# Patient Record
Sex: Male | Born: 1940 | Race: White | Hispanic: No | Marital: Single | Smoking: Former smoker
Health system: Southern US, Community
[De-identification: ages and names within clinical notes are randomized; demographics above are authoritative.]

## PROBLEM LIST (undated history)

## (undated) DIAGNOSIS — F32A Depression, unspecified: Secondary | ICD-10-CM

## (undated) DIAGNOSIS — K56609 Unspecified intestinal obstruction, unspecified as to partial versus complete obstruction: Secondary | ICD-10-CM

## (undated) DIAGNOSIS — N400 Enlarged prostate without lower urinary tract symptoms: Secondary | ICD-10-CM

## (undated) DIAGNOSIS — F329 Major depressive disorder, single episode, unspecified: Secondary | ICD-10-CM

## (undated) DIAGNOSIS — Z87442 Personal history of urinary calculi: Secondary | ICD-10-CM

## (undated) DIAGNOSIS — E785 Hyperlipidemia, unspecified: Secondary | ICD-10-CM

## (undated) DIAGNOSIS — K5792 Diverticulitis of intestine, part unspecified, without perforation or abscess without bleeding: Secondary | ICD-10-CM

## (undated) DIAGNOSIS — K8020 Calculus of gallbladder without cholecystitis without obstruction: Secondary | ICD-10-CM

## (undated) DIAGNOSIS — Z5189 Encounter for other specified aftercare: Secondary | ICD-10-CM

## (undated) DIAGNOSIS — J45909 Unspecified asthma, uncomplicated: Secondary | ICD-10-CM

## (undated) DIAGNOSIS — Z8719 Personal history of other diseases of the digestive system: Secondary | ICD-10-CM

## (undated) DIAGNOSIS — I10 Essential (primary) hypertension: Secondary | ICD-10-CM

## (undated) DIAGNOSIS — E079 Disorder of thyroid, unspecified: Secondary | ICD-10-CM

## (undated) DIAGNOSIS — N200 Calculus of kidney: Secondary | ICD-10-CM

## (undated) DIAGNOSIS — M1990 Unspecified osteoarthritis, unspecified site: Secondary | ICD-10-CM

## (undated) DIAGNOSIS — M5116 Intervertebral disc disorders with radiculopathy, lumbar region: Secondary | ICD-10-CM

## (undated) HISTORY — DX: Benign prostatic hyperplasia without lower urinary tract symptoms: N40.0

## (undated) HISTORY — DX: Unspecified asthma, uncomplicated: J45.909

## (undated) HISTORY — DX: Calculus of kidney: N20.0

## (undated) HISTORY — PX: APPENDECTOMY: SHX54

## (undated) HISTORY — PX: HERNIA REPAIR: SHX51

## (undated) HISTORY — DX: Calculus of gallbladder without cholecystitis without obstruction: K80.20

## (undated) HISTORY — DX: Intervertebral disc disorders with radiculopathy, lumbar region: M51.16

## (undated) HISTORY — DX: Major depressive disorder, single episode, unspecified: F32.9

## (undated) HISTORY — DX: Depression, unspecified: F32.A

## (undated) HISTORY — DX: Unspecified osteoarthritis, unspecified site: M19.90

## (undated) HISTORY — DX: Essential (primary) hypertension: I10

## (undated) HISTORY — DX: Hyperlipidemia, unspecified: E78.5

## (undated) HISTORY — DX: Unspecified intestinal obstruction, unspecified as to partial versus complete obstruction: K56.609

## (undated) HISTORY — DX: Diverticulitis of intestine, part unspecified, without perforation or abscess without bleeding: K57.92

## (undated) HISTORY — DX: Disorder of thyroid, unspecified: E07.9

---

## 2000-10-05 ENCOUNTER — Encounter: Payer: Self-pay | Admitting: Gastroenterology

## 2000-10-05 ENCOUNTER — Ambulatory Visit (HOSPITAL_COMMUNITY): Admission: RE | Admit: 2000-10-05 | Discharge: 2000-10-05 | Payer: Self-pay | Admitting: *Deleted

## 2001-01-31 ENCOUNTER — Encounter: Payer: Self-pay | Admitting: Gastroenterology

## 2001-01-31 ENCOUNTER — Ambulatory Visit (HOSPITAL_COMMUNITY): Admission: RE | Admit: 2001-01-31 | Discharge: 2001-01-31 | Payer: Self-pay | Admitting: Gastroenterology

## 2004-09-05 DIAGNOSIS — K5792 Diverticulitis of intestine, part unspecified, without perforation or abscess without bleeding: Secondary | ICD-10-CM

## 2004-09-05 HISTORY — DX: Diverticulitis of intestine, part unspecified, without perforation or abscess without bleeding: K57.92

## 2004-09-05 HISTORY — PX: COLECTOMY: SHX59

## 2005-09-05 HISTORY — PX: ILEOSTOMY: SHX1783

## 2006-05-21 ENCOUNTER — Emergency Department: Payer: Self-pay | Admitting: Emergency Medicine

## 2007-02-24 ENCOUNTER — Emergency Department: Payer: Self-pay | Admitting: Emergency Medicine

## 2007-12-19 ENCOUNTER — Ambulatory Visit: Payer: Self-pay | Admitting: Gastroenterology

## 2008-07-16 ENCOUNTER — Ambulatory Visit: Payer: Self-pay | Admitting: Oncology

## 2008-07-28 DIAGNOSIS — I10 Essential (primary) hypertension: Secondary | ICD-10-CM | POA: Insufficient documentation

## 2008-07-28 LAB — CMP (CANCER CENTER ONLY)
ALT(SGPT): 31 U/L (ref 10–47)
AST: 37 U/L (ref 11–38)
Albumin: 3.7 g/dL (ref 3.3–5.5)
Alkaline Phosphatase: 94 U/L — ABNORMAL HIGH (ref 26–84)
BUN, Bld: 17 mg/dL (ref 7–22)
CO2: 31 mEq/L (ref 18–33)
Calcium: 9.5 mg/dL (ref 8.0–10.3)
Chloride: 104 mEq/L (ref 98–108)
Creat: 1.3 mg/dl — ABNORMAL HIGH (ref 0.6–1.2)
Glucose, Bld: 93 mg/dL (ref 73–118)
Potassium: 3.8 mEq/L (ref 3.3–4.7)
Sodium: 139 mEq/L (ref 128–145)
Total Bilirubin: 0.6 mg/dl (ref 0.20–1.60)
Total Protein: 7.3 g/dL (ref 6.4–8.1)

## 2008-07-28 LAB — MORPHOLOGY - CHCC SATELLITE
PLT EST ~~LOC~~: ADEQUATE
Platelet Morphology: NORMAL

## 2008-07-28 LAB — CBC WITH DIFFERENTIAL (CANCER CENTER ONLY)
BASO#: 0 10*3/uL (ref 0.0–0.2)
BASO%: 0.9 % (ref 0.0–2.0)
EOS%: 4.3 % (ref 0.0–7.0)
HGB: 14.4 g/dL (ref 13.0–17.1)
LYMPH#: 1.3 10*3/uL (ref 0.9–3.3)
MCHC: 33.5 g/dL (ref 32.0–35.9)
NEUT#: 2.4 10*3/uL (ref 1.5–6.5)
WBC: 4.2 10*3/uL (ref 4.0–10.0)

## 2008-07-30 LAB — HEMOGLOBINOPATHY EVALUATION
Hemoglobin Other: 0 % (ref 0.0–0.0)
Hgb A2 Quant: 2.6 % (ref 2.2–3.2)
Hgb A: 97.4 % (ref 96.8–97.8)
Hgb F Quant: 0 % (ref 0.0–2.0)
Hgb S Quant: 0 % (ref 0.0–0.0)

## 2008-07-30 LAB — FOLATE: Folate: >20 ng/mL

## 2008-07-30 LAB — FERRITIN: Ferritin: 26 ng/mL (ref 22–322)

## 2008-07-30 LAB — IRON AND TIBC
%SAT: 18 % — ABNORMAL LOW (ref 20–55)
Iron: 75 ug/dL (ref 42–165)
TIBC: 407 ug/dL (ref 215–435)
UIBC: 332 ug/dL

## 2008-07-30 LAB — RETICULOCYTES (CHCC)
ABS Retic: 76.6 10*3/uL (ref 19.0–186.0)
RBC.: 5.89 MIL/uL — ABNORMAL HIGH (ref 4.22–5.81)
Retic Ct Pct: 1.3 % (ref 0.4–3.1)

## 2009-01-05 ENCOUNTER — Ambulatory Visit: Payer: Self-pay | Admitting: Oncology

## 2009-01-07 LAB — CBC WITH DIFFERENTIAL (CANCER CENTER ONLY)
BASO%: 3 % — ABNORMAL HIGH (ref 0.0–2.0)
EOS%: 3.1 % (ref 0.0–7.0)
HCT: 48 % (ref 38.7–49.9)
LYMPH#: 1.3 10*3/uL (ref 0.9–3.3)
MCHC: 34.8 g/dL (ref 32.0–35.9)
NEUT#: 3.2 10*3/uL (ref 1.5–6.5)
NEUT%: 60.4 % (ref 40.0–80.0)
Platelets: 182 10*3/uL (ref 145–400)
RDW: 13.1 % (ref 10.5–14.6)

## 2009-01-07 LAB — FERRITIN: Ferritin: 67 ng/mL (ref 22–322)

## 2009-01-07 LAB — IRON AND TIBC
%SAT: 44 % (ref 20–55)
Iron: 161 ug/dL (ref 42–165)
TIBC: 370 ug/dL (ref 215–435)
UIBC: 209 ug/dL

## 2009-09-05 HISTORY — PX: KNEE SURGERY: SHX244

## 2009-11-05 ENCOUNTER — Ambulatory Visit: Payer: Self-pay | Admitting: Rheumatology

## 2009-12-11 ENCOUNTER — Ambulatory Visit: Payer: Self-pay | Admitting: Orthopedic Surgery

## 2010-01-01 ENCOUNTER — Ambulatory Visit: Payer: Self-pay | Admitting: Orthopedic Surgery

## 2010-06-03 ENCOUNTER — Inpatient Hospital Stay: Payer: Self-pay | Admitting: Surgery

## 2011-01-21 NOTE — Procedures (Signed)
[facility]  Patient:    Stephen Cervantes, Stephen Cervantes                       MRN: [id number] Proc. Date: 10/05/00 Adm. Date:  [id number] Disc. Date: [id number] Attending:  Louie Bun CC:         Jethro Bastos, M.D.   Procedure Report  INDICATIONS FOR PROCEDURE:  Worsening fecal obstipation and straining at stool.  PROCEDURE:  The patient was placed in the left lateral decubitus position and placed on the pulse monitor with continuous low-flow oxygen, delivered by nasal cannula.  He was sedated with 70 mg of IV Demerol and 7 mg of IV Versed. The Olympus video colonoscope was inserted into the rectum and advanced to approximately 30 cm where some diverticula were noted, and an increasingly more pronounced appearance of granularity, edema, and firmness was encountered.  I could not traverse the rectosigmoid junction at about 25 cm. The scope was withdrawn.  The pediatric colonoscope was inserted and encountered the same difficulty.  I could not completely rule out the leading edge, this obstructive area being the leading edge of a neoplasm but probably appeared more consistent with diverticulitis, based on the overall appearance. The scope was then withdrawn, and the patient returned to the recovery room in a stable condition.  He tolerated the procedure well, and there were no immediate complications.  IMPRESSION: 1. Incomplete colonoscopy. 2. Probable partial obstruction at the rectosigmoid junction.  Suspect diverticulitis.  Rule out neoplasm with bleeder without confined perforation.  PLAN:  Will obtain CT scan and CBC.  Will keep him on a full liquid diet for now.  Will begin Cipro empirically. DD:  10/05/00 TD:  10/05/00 Job: [id number]

## 2011-06-01 ENCOUNTER — Inpatient Hospital Stay: Payer: Self-pay | Admitting: Surgery

## 2011-06-03 LAB — PATHOLOGY REPORT

## 2011-08-17 ENCOUNTER — Ambulatory Visit: Payer: Self-pay

## 2012-09-01 ENCOUNTER — Emergency Department: Payer: Self-pay | Admitting: Emergency Medicine

## 2012-09-01 LAB — COMPREHENSIVE METABOLIC PANEL
Albumin: 4.1 g/dL (ref 3.4–5.0)
Anion Gap: 7 (ref 7–16)
Calcium, Total: 10.1 mg/dL (ref 8.5–10.1)
Chloride: 104 mmol/L (ref 98–107)
EGFR (African American): 48 — ABNORMAL LOW
Glucose: 127 mg/dL — ABNORMAL HIGH (ref 65–99)
Potassium: 3.8 mmol/L (ref 3.5–5.1)
SGOT(AST): 67 U/L — ABNORMAL HIGH (ref 15–37)
SGPT (ALT): 96 U/L — ABNORMAL HIGH (ref 12–78)

## 2012-09-01 LAB — URINALYSIS, COMPLETE
Bilirubin,UR: NEGATIVE
Ketone: NEGATIVE
Leukocyte Esterase: NEGATIVE
Ph: 5 (ref 4.5–8.0)
RBC,UR: 156 /HPF (ref 0–5)
Squamous Epithelial: NONE SEEN

## 2012-09-01 LAB — CBC
HCT: 50.2 % (ref 40.0–52.0)
MCH: 30.7 pg (ref 26.0–34.0)
MCV: 91 fL (ref 80–100)
Platelet: 188 10*3/uL (ref 150–440)
RBC: 5.54 10*6/uL (ref 4.40–5.90)

## 2012-09-01 LAB — LIPASE, BLOOD: Lipase: 166 U/L (ref 73–393)

## 2012-09-05 HISTORY — PX: JOINT REPLACEMENT: SHX530

## 2012-09-13 ENCOUNTER — Ambulatory Visit: Payer: Self-pay | Admitting: Urology

## 2012-09-13 DIAGNOSIS — R310 Gross hematuria: Secondary | ICD-10-CM | POA: Insufficient documentation

## 2012-09-13 DIAGNOSIS — D419 Neoplasm of uncertain behavior of unspecified urinary organ: Secondary | ICD-10-CM | POA: Insufficient documentation

## 2012-09-13 DIAGNOSIS — N23 Unspecified renal colic: Secondary | ICD-10-CM | POA: Insufficient documentation

## 2012-09-13 DIAGNOSIS — N201 Calculus of ureter: Secondary | ICD-10-CM | POA: Insufficient documentation

## 2012-09-13 DIAGNOSIS — N200 Calculus of kidney: Secondary | ICD-10-CM | POA: Insufficient documentation

## 2013-01-09 ENCOUNTER — Ambulatory Visit: Payer: Self-pay | Admitting: Urology

## 2013-01-09 DIAGNOSIS — N401 Enlarged prostate with lower urinary tract symptoms: Secondary | ICD-10-CM | POA: Insufficient documentation

## 2013-01-09 DIAGNOSIS — N400 Enlarged prostate without lower urinary tract symptoms: Secondary | ICD-10-CM | POA: Insufficient documentation

## 2013-01-09 DIAGNOSIS — N138 Other obstructive and reflux uropathy: Secondary | ICD-10-CM | POA: Insufficient documentation

## 2013-02-11 ENCOUNTER — Ambulatory Visit: Payer: Self-pay | Admitting: Orthopedic Surgery

## 2013-02-11 LAB — URINALYSIS, COMPLETE
Bilirubin,UR: NEGATIVE
Glucose,UR: NEGATIVE mg/dL (ref 0–75)
Ketone: NEGATIVE
Leukocyte Esterase: NEGATIVE
Nitrite: NEGATIVE
Ph: 5 (ref 4.5–8.0)
RBC,UR: 1 /HPF (ref 0–5)

## 2013-02-11 LAB — BASIC METABOLIC PANEL
Anion Gap: 6 — ABNORMAL LOW (ref 7–16)
Calcium, Total: 9.2 mg/dL (ref 8.5–10.1)
Creatinine: 1.05 mg/dL (ref 0.60–1.30)
EGFR (African American): >60
EGFR (Non-African Amer.): >60
Osmolality: 283 (ref 275–301)
Potassium: 3.4 mmol/L — ABNORMAL LOW (ref 3.5–5.1)
Sodium: 141 mmol/L (ref 136–145)

## 2013-02-11 LAB — CBC
HGB: 16.2 g/dL (ref 13.0–18.0)
MCV: 88 fL (ref 80–100)
RBC: 5.22 10*6/uL (ref 4.40–5.90)
RDW: 13.2 % (ref 11.5–14.5)
WBC: 3.5 10*3/uL — ABNORMAL LOW (ref 3.8–10.6)

## 2013-02-11 LAB — MRSA PCR SCREENING

## 2013-02-11 LAB — APTT: Activated PTT: 33.2 secs (ref 23.6–35.9)

## 2013-02-11 LAB — PROTIME-INR
INR: 0.9
Prothrombin Time: 12.8 secs (ref 11.5–14.7)

## 2013-02-28 ENCOUNTER — Inpatient Hospital Stay: Payer: Self-pay | Admitting: Orthopedic Surgery

## 2013-03-01 LAB — PLATELET COUNT: Platelet: 154 10*3/uL (ref 150–440)

## 2013-03-01 LAB — BASIC METABOLIC PANEL
Calcium, Total: 7.8 mg/dL — ABNORMAL LOW (ref 8.5–10.1)
Co2: 28 mmol/L (ref 21–32)
EGFR (African American): >60
EGFR (Non-African Amer.): 59 — ABNORMAL LOW
Osmolality: 284 (ref 275–301)
Potassium: 3.9 mmol/L (ref 3.5–5.1)

## 2013-03-01 LAB — HEMOGLOBIN: HGB: 12.4 g/dL — ABNORMAL LOW (ref 13.0–18.0)

## 2014-04-06 ENCOUNTER — Inpatient Hospital Stay: Payer: Self-pay | Admitting: Radiology

## 2014-04-06 LAB — COMPREHENSIVE METABOLIC PANEL
ALBUMIN: 3.7 g/dL (ref 3.4–5.0)
ALK PHOS: 92 U/L
ANION GAP: 7 (ref 7–16)
AST: 48 U/L — AB (ref 15–37)
BUN: 14 mg/dL (ref 7–18)
Bilirubin,Total: 0.8 mg/dL (ref 0.2–1.0)
CALCIUM: 9.7 mg/dL (ref 8.5–10.1)
Chloride: 108 mmol/L — ABNORMAL HIGH (ref 98–107)
Co2: 25 mmol/L (ref 21–32)
Creatinine: 1.16 mg/dL (ref 0.60–1.30)
Glucose: 101 mg/dL — ABNORMAL HIGH (ref 65–99)
OSMOLALITY: 280 (ref 275–301)
Potassium: 4.1 mmol/L (ref 3.5–5.1)
SGPT (ALT): 43 U/L
Sodium: 140 mmol/L (ref 136–145)
Total Protein: 7.4 g/dL (ref 6.4–8.2)

## 2014-04-06 LAB — CBC WITH DIFFERENTIAL/PLATELET
BASOS ABS: 0.1 10*3/uL (ref 0.0–0.1)
Basophil %: 0.7 %
Eosinophil #: 0.1 10*3/uL (ref 0.0–0.7)
Eosinophil %: 1.5 %
HCT: 49.7 % (ref 40.0–52.0)
HGB: 16.8 g/dL (ref 13.0–18.0)
Lymphocyte #: 0.8 10*3/uL — ABNORMAL LOW (ref 1.0–3.6)
Lymphocyte %: 11.6 %
MCH: 30.6 pg (ref 26.0–34.0)
MCHC: 33.9 g/dL (ref 32.0–36.0)
MCV: 90 fL (ref 80–100)
MONO ABS: 0.4 x10 3/mm (ref 0.2–1.0)
Monocyte %: 6 %
Neutrophil #: 5.7 10*3/uL (ref 1.4–6.5)
Neutrophil %: 80.2 %
PLATELETS: 182 10*3/uL (ref 150–440)
RBC: 5.5 10*6/uL (ref 4.40–5.90)
RDW: 13.5 % (ref 11.5–14.5)
WBC: 7.1 10*3/uL (ref 3.8–10.6)

## 2014-04-06 LAB — LIPASE, BLOOD: Lipase: 137 U/L (ref 73–393)

## 2014-04-07 LAB — CBC WITH DIFFERENTIAL/PLATELET
BASOS PCT: 0.7 %
Basophil #: 0 10*3/uL (ref 0.0–0.1)
EOS PCT: 3.2 %
Eosinophil #: 0.2 10*3/uL (ref 0.0–0.7)
HCT: 43.5 % (ref 40.0–52.0)
HGB: 14.6 g/dL (ref 13.0–18.0)
LYMPHS PCT: 27.5 %
Lymphocyte #: 1.4 10*3/uL (ref 1.0–3.6)
MCH: 30.9 pg (ref 26.0–34.0)
MCHC: 33.5 g/dL (ref 32.0–36.0)
MCV: 92 fL (ref 80–100)
MONOS PCT: 9.8 %
Monocyte #: 0.5 x10 3/mm (ref 0.2–1.0)
NEUTROS PCT: 58.8 %
Neutrophil #: 3 10*3/uL (ref 1.4–6.5)
PLATELETS: 142 10*3/uL — AB (ref 150–440)
RBC: 4.71 10*6/uL (ref 4.40–5.90)
RDW: 13.2 % (ref 11.5–14.5)
WBC: 5.1 10*3/uL (ref 3.8–10.6)

## 2014-04-07 LAB — COMPREHENSIVE METABOLIC PANEL
ALBUMIN: 2.9 g/dL — AB (ref 3.4–5.0)
ALT: 36 U/L
Alkaline Phosphatase: 81 U/L
Anion Gap: 2 — ABNORMAL LOW (ref 7–16)
BUN: 10 mg/dL (ref 7–18)
Bilirubin,Total: 0.8 mg/dL (ref 0.2–1.0)
CALCIUM: 8.2 mg/dL — AB (ref 8.5–10.1)
CO2: 27 mmol/L (ref 21–32)
Chloride: 111 mmol/L — ABNORMAL HIGH (ref 98–107)
Creatinine: 1.15 mg/dL (ref 0.60–1.30)
EGFR (African American): >60
GLUCOSE: 111 mg/dL — AB (ref 65–99)
Osmolality: 279 (ref 275–301)
Potassium: 4.2 mmol/L (ref 3.5–5.1)
SGOT(AST): 28 U/L (ref 15–37)
SODIUM: 140 mmol/L (ref 136–145)
Total Protein: 5.9 g/dL — ABNORMAL LOW (ref 6.4–8.2)

## 2014-04-07 LAB — PROTIME-INR
INR: 1.1
Prothrombin Time: 13.9 secs (ref 11.5–14.7)

## 2014-04-07 LAB — AMYLASE: Amylase: 51 U/L (ref 25–115)

## 2014-04-07 LAB — APTT: ACTIVATED PTT: 35.3 s (ref 23.6–35.9)

## 2014-04-07 LAB — MAGNESIUM: Magnesium: 1.8 mg/dL

## 2014-07-02 ENCOUNTER — Ambulatory Visit (INDEPENDENT_AMBULATORY_CARE_PROVIDER_SITE_OTHER): Payer: Medicare Other | Admitting: General Surgery

## 2014-07-02 ENCOUNTER — Encounter: Payer: Self-pay | Admitting: General Surgery

## 2014-07-02 VITALS — BP 130/100 | HR 88 | Resp 14 | Ht 70.0 in | Wt 186.0 lb

## 2014-07-02 DIAGNOSIS — K8020 Calculus of gallbladder without cholecystitis without obstruction: Secondary | ICD-10-CM

## 2014-07-02 DIAGNOSIS — R109 Unspecified abdominal pain: Secondary | ICD-10-CM

## 2014-07-02 NOTE — Progress Notes (Addendum)
Patient ID: Stephen Cervantes, male   DOB: [birth year], 73 y.o.   MRN: [id number]  Chief Complaint  Patient presents with  . Other    Evaluation for gallstones    HPI Stephen Cervantes is a 73 y.o. male who presents for an evaluation of gallstones. The patient had an abdominal CT scan done on 06/12/14 at [facility] for abdominal pain. The patient complains of  abdominal pain for approximately 2 months. He states he has left lower quadrant pain that radiates to his back. He has had 1 episode that was really intense approximately 1.5 months ago. No known contributing facts that he is aware of. The pain comes and goes. The pain is described as stabbing pain.  The patient reports that he continues to have a-10 bowel movements per day since takedown of his ileostomy 6+ years ago.  HPI  Past Medical History  Diagnosis Date  . Arthritis   . Thyroid disease   . Kidney stone   . Enlarged prostate   . Bowel obstruction 2011,2015  . Diverticulitis 2006    Past Surgical History  Procedure Laterality Date  . Colectomy  2006  . Ileostomy  2007  . Hernia repair  2008?    [place]  . Knee surgery Right 2011  . Joint replacement  2014    Hip Replacement Hessie Knows    Family History  Problem Relation Age of Onset  . Cancer Father     Social History History  Substance Use Topics  . Smoking status: Former Smoker -- 19 years  . Smokeless tobacco: Not on file  . Alcohol Use: No    Allergies  Allergen Reactions  . Morphine And Related Other (See Comments)    Severe hallucinations  . Tape Other (See Comments)    Silk Tape per patient "peels off my skin"    Current Outpatient Prescriptions  Medication Sig Dispense Refill  . calcium-vitamin D 250-100 MG-UNIT per tablet Take 1 tablet by mouth 2 (two) times daily.    . celecoxib (CELEBREX) 200 MG capsule 200 mg daily.     . cephALEXin (KEFLEX) 500 MG capsule Take 500 mg by mouth as needed.    .  hydrochlorothiazide (HYDRODIURIL) 25 MG tablet Take 25 mg by mouth daily.     Marland Kitchen levothyroxine (SYNTHROID, LEVOTHROID) 100 MCG tablet Take 100 mcg by mouth daily before breakfast.     . loperamide (IMODIUM) 2 MG capsule Take by mouth as needed for diarrhea or loose stools.    Marland Kitchen loratadine (CLARITIN) 10 MG tablet Take 10 mg by mouth daily.    . Lutein 20 MG CAPS Take 1 capsule by mouth daily.    . montelukast (SINGULAIR) 10 MG tablet Take 10 mg by mouth at bedtime.     . Multiple Vitamin (MULTI VITAMIN DAILY PO) Take 1 tablet by mouth daily.    . Omega-3 Fatty Acids (FISH OIL) 1000 MG CAPS Take 1 capsule by mouth daily.    . potassium citrate (UROCIT-K) 10 MEQ (1080 MG) SR tablet Take 10 mEq by mouth 3 (three) times daily with meals.      No current facility-administered medications for this visit.    Review of Systems Review of Systems  Constitutional: Negative.   Respiratory: Negative.   Cardiovascular: Negative.   Gastrointestinal: Positive for abdominal pain.    Blood pressure 130/100, pulse 88, resp. rate 14, height 5\' 10"  (1.778 m), weight 186 lb (84.369 kg).  Physical Exam  Physical Exam  Constitutional: He is oriented to person, place, and time. He appears well-developed and well-nourished.  Cardiovascular: Normal rate, regular rhythm and normal heart sounds.   No murmur heard. Pulmonary/Chest: Effort normal and breath sounds normal.  Abdominal: Soft. Normal appearance and bowel sounds are normal. There is no hepatosplenomegaly. There is no tenderness. No hernia.    Neurological: He is alert and oriented to person, place, and time.  Skin: Skin is warm and dry.    Data Reviewed CT scan of 06/12/2014 completed at the primary care office report was available for review. The images were not available for review. Surgical changes on the abdominal wall secondary mesh placement are noted. A 1 mm calculus in the upper pole of the right kidney. One millimeter calculus in the upper  and lower pole of the left kidney. The gallbladder was noted to be contracted and containing small stones. No other intra-abdominal pathology reported.  September 2012 operative report from [facility].  PCP notes of [facility] since September and October, 2015 were reviewed. reviewed.  Assessment    Asymptomatic cholelithiasis.  Multiple previous abdominal procedures including laparotomy for small bowel obstruction September 2012 completed by Molly Maduro, M.D.    Plan    The patient has asymptomatic cholelithiasis. There is no dietary history that could be elicited. He has no history of right-sided abdominal pain. No laboratory studies are available for review to suggest occult disease.  At this time there is no indication for surgical intervention. The source of the patient's left-sided pain is not evident on today's exam or based on the CT report noted above.  The patient was amenable to review the records completed at [facility] regarding his initial colectomy. A release of information has been obtained.    PCP/Ref. MD: Ruben Im 07/11/2014, 8:41 AM

## 2014-07-02 NOTE — Patient Instructions (Signed)
Patient to return as needed. The patient is aware to call back for any questions or concerns. 

## 2014-07-04 DIAGNOSIS — R10A2 Flank pain, left side: Secondary | ICD-10-CM | POA: Insufficient documentation

## 2014-07-04 DIAGNOSIS — R109 Unspecified abdominal pain: Secondary | ICD-10-CM | POA: Insufficient documentation

## 2014-07-04 DIAGNOSIS — K8020 Calculus of gallbladder without cholecystitis without obstruction: Secondary | ICD-10-CM

## 2014-07-04 HISTORY — DX: Calculus of gallbladder without cholecystitis without obstruction: K80.20

## 2014-10-04 ENCOUNTER — Encounter: Payer: Self-pay | Admitting: General Surgery

## 2014-10-04 NOTE — Progress Notes (Signed)
Records from [place] for the patient's hospitalization covering 03/30/2005 through 05/05/2005 were reviewed.  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 He underwent endoluminal luminal stent placement without relief of the obstruction. He was subtotally noted 3 days later to have marked abdominal distention and free air. 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 Intraoperative blood loss was impressive at 1100 mL due to a tear in the veins just below the inferior border of the pancreas. Pathology showed diverticular disease without evidence of malignancy.  CT scan of the abdomen and pelvis completed 06/12/2014 at [place] showed changes of the abdominal wall status post hernia repair and gallstones without evidence of cholecystitis, but otherwise was unremarkable.  At the time of the patient's October 2015 exam he had no clinical symptoms to suggest a biliary source of his left upper quadrant pain. Observation alone is planned that present in regards to his gallstones.

## 2014-10-06 ENCOUNTER — Telehealth: Payer: Self-pay

## 2014-10-06 NOTE — Telephone Encounter (Signed)
-----   Message from Robert Bellow, MD sent at 10/04/2014  6:30 PM EST ----- Please notify the patient that I reviewed his 2008 emergency colon surgery records from [facility]. He is a very lucky man. He was seen here in October in regards to left upper quadrant discomfort and incidentally noted gallstones. Inquire as to whether his left-sided pain has resolved or if he's developed any dietary intolerance (none evident on his interview in October). Thank you

## 2014-10-07 NOTE — Telephone Encounter (Signed)
Spoke with patient and he reports that he is doing very well and has had no further problems and no dietary intolerances. He will call with any problems in the future.

## 2014-12-26 NOTE — Op Note (Signed)
PATIENT NAME:  JEWELL, Stephen Cervantes DATE OF BIRTH:  03-Sep-1941  DATE OF PROCEDURE:  02/28/2013  PREOPERATIVE DIAGNOSIS:  Severe right hip osteoarthritis.   POSTOPERATIVE DIAGNOSIS:  Severe right hip osteoarthritis.   PROCEDURE:  Right total hip replacement.   SURGEON:  Laurene Footman, MD   ASSISTANT:  April M. Tretha Sciara, Nurse Practitioner.   ANESTHESIA:  Spinal.   DESCRIPTION OF PROCEDURE:  The patient was brought to the operating room and after anesthesia was obtained, the patient was transferred to the operative table in the supine position with left leg in the well leg holder and right leg in the Medacta attachment. The C-arm was brought in and good visualization of the hip could be obtained. Next, the hip was prepped and draped in the usual sterile fashion. After patient identification and timeout procedures were carried out, the hip was approached through an anterior incision centered over the greater trochanter approximately 9 cm in length. After going down through the skin and subcutaneous tissue, the tensor fascia was entered and retracted laterally. Deep fascia incised and the circumflex vessels clamped and ligated. The anterior capsule was exposed and the fat removed. Anterior capsulotomy was carried out and a modified Charnley placed after femoral neck resection. The femoral head showed severe degenerative change with marked arthritis superiorly. The labrum was excised and the acetabulum reamed to 54 mm. The 54 mm trial fit well and the 54 mm cup was a nice solid fit and appropriate position on x-ray. The leg was externally rotated and capsular release carried out to allow for external rotation and extension along with adduction. Sequential broaching was carried out to the #3 and with trials, this was a nice fit. Then the #3 stem appeared appropriate. The #3 stem was then impacted and a 28 mm head and 54 mm DM liner. The hip was reduced and was stable to external rotation. Leg  lengths appeared to be compatible with preop x-ray. The wound was thoroughly irrigated. The capsule was repaired using Ethibond suture. The deep fascia repaired using a heavy quill, subcutaneous drain, 2-0 quill followed by Xeroform, 4 x 4's, ABD and tape. The patient was sent to the recovery room in stable condition.   ESTIMATED BLOOD LOSS:  700 mL.   COMPLICATIONS:  None.   SPECIMENS:  Femoral head.    ____________________________ Laurene Footman, MD mjm:si D: 02/28/2013 18:41:24 ET T: 02/28/2013 23:03:48 ET JOB#: [id number]  cc: Laurene Footman, MD, <Dictator> Laurene Footman MD ELECTRONICALLY SIGNED 03/01/2013 4:55

## 2014-12-26 NOTE — Discharge Summary (Signed)
PATIENT NAME:  Stephen Cervantes, Stephen Cervantes MR#:  [id number] DATE OF BIRTH:  12/02/1940  DATE OF ADMISSION:  02/28/2013 DATE OF DISCHARGE:  03/02/2013  ADMITTING DIAGNOSIS:  Severe right hip osteoarthritis.   DISCHARGE DIAGNOSIS:  Severe right hip osteoarthritis.   PROCEDURE:  Right total hip replacement.   SURGEON:  Laurene Footman, M.D.   ASSISTANT:  April M. Tretha Sciara, Nurse Practitioner   ANESTHESIA:  Spinal.   ESTIMATED BLOOD LOSS:  700 mL.   COMPLICATIONS:  None.   SPECIMEN:  Femoral head.   HISTORY OF PRESENT ILLNESS:  The patient is a 74 year old who has had moderate severe right hip pain for the last 3 to 4 months. X-rays showed significant degenerative changes of the right hip joint. The patient has had no relief and anti-inflammatory medications. His pain interferes with his ADLs and he is unable to walk for long distances. He is unable to walk to the mailbox to get mail due to significant right hip pain. He has pain with rest as well as with activity. He denies any back pain, numbness or tingling, or weakness in bilateral lower extremities.   PHYSICAL EXAMINATION:  GENERAL:  Well developed, well-nourished male in no apparent distress. Normal affect. mild antalgic component on right lower extremity.  HEENT:  Head normocephalic, atraumatic. Pupils equal, and reactive light.  LUNGS:  Clear to auscultation bilaterally. No wheezing, rhonchi, or rales.  HEART:  Regular rate and rhythm.  RIGHT HIP:  Examination of the right hip shows the patient has no tenderness over the greater trochanter. He has 0 degrees of internal rotation of the right hip with mild groin discomfort. He also has pain with hip flexion, external rotation of the hip joint. He is neurovascularly intact in the right lower extremity.   HOSPITAL COURSE:  The patient was admitted to the hospital on 02/28/2013. He had surgery the same day and was brought to the orthopaedic floor from the PACU in stable condition. On postoperative  day 1, the patient had a very mild acute postoperative blood loss anemia with a hemoglobin of 12.4. The patient's vital signs remained stable and he had excellent progress with physical therapy. On postoperative day 2, the patient was doing very well. He had progressed well with therapy, vital signs were stable, and he was ready for discharge to home with home physical therapy.   CONDITION AT DISCHARGE:  Stable.   DISCHARGE INSTRUCTIONS:  The patient may gradually increase weightbearing on the affected extremity. He is to wear thigh-high TED hose on both legs and remove at bedtime and replace when arising the next morning. Elevate heels off the bed. Use incentive spirometry every one hour while awake and encourage cough and deep breathing. He may resume a regular diet as tolerated. Apply an ice pack to the affected area. Do not get the dressing or bandage wet or dirty. Call [place] if any of the following occur such as bright red bleeding from the incision wound, fever above 101.5 degrees, redness, swelling, or drainage at the incision. Call [place] if you experience any increased leg pain, numbness or weakness in legs or bowel or bladder symptoms. He is referred to Home Physical Therapy. He should contact us if he has not been contacted within 48 hours of his return home and he is to call  [place] for appointment in two weeks for staple removal.   DISCHARGE MEDICATIONS:  Loperamide 2 mg oral tablet 1 tablet orally 4 times a day,  potassium citrate 10 mEq oral tablet extended release 1 tablet orally 4 times a day, hydrochlorothiazide 25 mg oral tablet 1 tablet orally once a day in the morning, levothyroxine 150 mcg oral tablet 1 tablet orally once a day in the morning, Loratadine 10 mg oral tablet 1 tablet orally once a day in the morning, montelucast 10 mg oral tablet 1 tablet orally once a day in the morning, multivitamin 1 tablet orally once a  day in the morning, Vicodin 7.5/325, 1 tablet orally 4 to 6 hours as needed for pain, Tylenol 500 mg oral tablet 1 tablet orally every 4 hours as needed for pain or temperature greater than 100.4, magnesium hydroxide 8% oral suspension 30     mL orally 2 times a day as needed for constipation, Xarelto 10 mg oral tablet 1 tablet orally once a day in the morning x 9 days.    ____________________________ T. Rachelle Hora, PA-C tcg:jm D: 03/02/2013 08:09:54 ET T: 03/02/2013 17:57:00 ET JOB#: [id number]  cc: T. Rachelle Hora, PA-C, <Dictator> Duanne Guess [place] ELECTRONICALLY SIGNED 03/13/2013 7:43

## 2014-12-27 NOTE — H&P (Signed)
History of Present Illness 63 yom, with an extensive surgical Hx, who was awoken at 2:00 AM with severe abdominal pain; he could not return to sleep. He ate a normal dinner and had his last BM last PM, but he typically has at least 10 BMs per day. He says his abdomen is bloated, and currently he rates his abdominal pain as 3/10. It is not constant; it comes and goes. He is anorexic and nauseous, but he has not vomited. He had a total abdominal colectomy and ileostomy in 2006 for ruptured diverticulitis, ileostomy takedown and (I think, an)ileosigmoid colostomy in 2007 (both by Lucillie Garfinkel), a Stoppa mesh [facility] in 2009, and an exploratory laparotomy, extensive enterolysis, small bowel repair x 2, multiple mesh excisions, and omentoplasty by me in 2012 (I put the omentum between his remaining mesh and his intestine).   Past Med/Surgical Hx:  Hypothyroidism:   Sleep Apnea-Uses C-Pap:   Hypertension:   ruptured colon:   Hernia Repair With Mesh:   Ileostomy Take Down:   Colectomy:   ALLERGIES:  Morphine: Alt Ment Status, Hallucinations  Oxycodone: Other, Itching  silk tape: Blisters  HOME MEDICATIONS: Medication Instructions Status  acetaminophen 500 mg oral tablet 2 tabs (1025m) orally every 6 hours as needed pain. Active  rivaroxaban 10 mg oral tablet 1 tab(s) orally once a day (in the morning) x 30 days Active  magnesium hydroxide 8% oral suspension 30 milliliter(s) orally 2 times a day, As needed, constipation Active  acetaminophen-HYDROcodone 325 mg-7.5 mg oral tablet 1 tab(s) orally  Active  loperamide 2 mg oral tablet 1 tab(s) orally 3-4 times a day Active  potassium citrate 10 mEq oral tablet, extended release 1 tab(s) orally 4 times a day Active  hydrochlorothiazide 25 mg oral tablet 1 tab(s) orally once a day (in the morning) Active  levothyroxine 150 mcg (0.15 mg) oral tablet 1 tab(s) orally once a day (in the morning) Active  loratadine 10 mg oral tablet 1 tab(s) orally once a day  (in the morning) Active  montelukast 10 mg oral tablet 1 tab(s) orally once a day (in the morning) Active  multivitamin 1 tab(s) orally once a day (in the morning) Active   Family and Social History:  Family History Non-Contributory   Social History negative tobacco, negative ETOH, negative Illicit drugs, divorced, retired, self-employed   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   Review of Systems:  Fever/Chills No   Cough No   Sputum No   Abdominal Pain Yes   Diarrhea No   Constipation Yes   Nausea/Vomiting Yes   SOB/DOE No   Chest Pain No   Dysuria No   Tolerating PT Yes   Tolerating Diet No  Nauseated   Medications/Allergies Reviewed Medications/Allergies reviewed   Physical Exam:  GEN well developed, well nourished, no acute distress   HEENT pink conjunctivae, PERRL, hearing intact to voice, moist oral mucosa, Oropharynx clear   NECK supple  trachea midline   RESP normal resp effort  clear BS  no use of accessory muscles   CARD regular rate  no murmur  no JVD  no Rub   ABD denies tenderness  mild distention, no tympany, no tenderness   LYMPH negative neck   EXTR negative cyanosis/clubbing, negative edema   SKIN normal to palpation, skin turgor good   NEURO cranial nerves intact, negative tremor, follows commands, motor/sensory function intact   PSYCH alert, A+O to time, place, person, good insight   Lab Results: Hepatic:  02-Aug-15 11:50  Bilirubin, Total 0.8  Alkaline Phosphatase 92 (46-116 NOTE: New Reference Range 03/25/14)  SGPT (ALT) 43 (14-63 NOTE: New Reference Range 03/25/14)  SGOT (AST)  48  Total Protein, Serum 7.4  Albumin, Serum 3.7  Routine Chem:  02-Aug-15 11:50   Lipase 137 (Result(s) reported on 06 Apr 2014 at 12:30PM.)  Glucose, Serum  101  BUN 14  Creatinine (comp) 1.16  Sodium, Serum 140  Potassium, Serum 4.1  Chloride, Serum  108  CO2, Serum 25  Calcium (Total), Serum 9.7  Osmolality (calc) 280  eGFR (African American)  >60  eGFR (Non-African American) >60 (eGFR values <35m/min/1.73 m2 may be an indication of chronic kidney disease (CKD). Calculated eGFR is useful in patients with stable renal function. The eGFR calculation will not be reliable in acutely ill patients when serum creatinine is changing rapidly. It is not useful in  patients on dialysis. The eGFR calculation may not be applicable to patients at the low and high extremes of body sizes, pregnant women, and vegetarians.)  Result Comment POTASSIUM/AST - Slight hemolysis, interpret results with  - caution.  Result(s) reported on 06 Apr 2014 at 12:06PM.  Anion Gap 7  Routine Hem:  02-Aug-15 11:50   WBC (CBC) 7.1  RBC (CBC) 5.50  Hemoglobin (CBC) 16.8  Hematocrit (CBC) 49.7  Platelet Count (CBC) 182  MCV 90  MCH 30.6  MCHC 33.9  RDW 13.5  Neutrophil % 80.2  Lymphocyte % 11.6  Monocyte % 6.0  Eosinophil % 1.5  Basophil % 0.7  Neutrophil # 5.7  Lymphocyte #  0.8  Monocyte # 0.4  Eosinophil # 0.1  Basophil # 0.1 (Result(s) reported on 06 Apr 2014 at 12:06PM.)   Radiology Results: XRay:    02-Aug-15 12:05, Abdomen 3 Way Includes PA Chest  Abdomen 3 Way Includes PA Chest  REASON FOR EXAM:    hx of sbo, now with nausea  COMMENTS:       PROCEDURE: DXR - DXR ABDOMEN 3-WAY (INCL PA CXR)  - Apr 06 2014 12:05PM     CLINICAL DATA:  Nausea and vomiting.  Prior small bowel obstruction.    EXAM:  ABDOMEN SERIES    COMPARISON:  01/09/2013    FINDINGS:  Heart size is normal. The lungs are clear but slightly hypoaerated.  No free air beneath the diaphragms. The aorta is unfolded and  ectatic.    Differential air-fluid levels are identified over the mid abdomen.  There is a dominant 4.0 cm loop of bowel over the right mid abdomen  which is favored to represent colon but could represent dilated  small bowel. No other dilated loop of small bowel is identified. No  colonic dilatation. Clips overlie the abdomen. Right total  hip  arthroplasty is noted. Degenerative change and mild leftward  rotatory scoliosis is noted centered at L3.     IMPRESSION:  Mid abdominal differential air-fluid levels which could indicate  early/ partial small bowel obstruction.    No acute cardiopulmonary process.  Electronically Signed    By: GConchita ParisM.D.    On: 04/06/2014 12:50         Verified By: GArline Asp M.D.,  LabUnknown:  PACS Image    Assessment/Admission Diagnosis Recurrent bowel obstruction, s/p multiple abdominal surgeries, including TAC, ileosigmoid colostomy, and mesh VHR   Plan Admit, hydrate, observe   Electronic Signatures: MConsuela Mimes(MD)  (Signed 02-Aug-15 14:01)  Authored: CHIEF COMPLAINT and HISTORY, PAST MEDICAL/SURGIAL HISTORY, ALLERGIES, HOME MEDICATIONS, FAMILY  AND SOCIAL HISTORY, REVIEW OF SYSTEMS, PHYSICAL EXAM, LABS, Radiology, ASSESSMENT AND PLAN   Last Updated: 02-Aug-15 14:01 by Consuela Mimes (MD)

## 2014-12-27 NOTE — Discharge Summary (Signed)
PATIENT NAME:  Stephen Cervantes, Stephen Cervantes MR#:  [id number] DATE OF BIRTH:  1941/02/10  DATE OF ADMISSION:  04/06/2014 DATE OF DISCHARGE:  04/07/2014  DIAGNOSES: Small bowel obstruction, resolved; hypothyroidism; sleep apnea; hypertension.   PROCEDURES: None.  HISTORY OF PRESENT ILLNESS AND HOSPITAL COURSE: This is a patient who was admitted to  the hospital with signs of a bowel obstruction. No nasogastric tube was utilized on admission because he had not been vomiting, but his x-ray suggested bowel obstruction. Spontaneously last night he started having bowel movements, passed gas, and has had multiple bowel movements and now feels well, stating he has no further nausea, has not vomited, is tolerating a full liquid diet, and having no abdominal pain. He wishes to go home if a serial exam done today as well as a followup KUB demonstrates a nonspecific bowel gas pattern without signs of bowel obstruction.  DISPOSITION: He is discharged in stable condition to restart his home medications. No new medications are added.  FOLLOWUP: He will follow up with Dr. Leanora Cover as needed.   ____________________________ Jerrol Banana Burt Knack, MD rec:sk D: 04/07/2014 15:19:44 ET T: 04/08/2014 02:05:08 ET JOB#: [id number]  cc: Jerrol Banana. Burt Knack, MD, <Dictator> Florene Glen MD ELECTRONICALLY SIGNED 04/08/2014 7:42

## 2015-03-11 ENCOUNTER — Other Ambulatory Visit: Payer: Self-pay | Admitting: Orthopedic Surgery

## 2015-03-11 DIAGNOSIS — Z96649 Presence of unspecified artificial hip joint: Principal | ICD-10-CM

## 2015-03-11 DIAGNOSIS — T8484XA Pain due to internal orthopedic prosthetic devices, implants and grafts, initial encounter: Secondary | ICD-10-CM

## 2015-03-18 ENCOUNTER — Encounter
Admission: RE | Admit: 2015-03-18 | Discharge: 2015-03-18 | Disposition: A | Discharge status: Home / Self Care | Payer: Medicare Other | Source: Ambulatory Visit | Attending: Orthopedic Surgery | Admitting: Orthopedic Surgery

## 2015-03-18 DIAGNOSIS — Z96649 Presence of unspecified artificial hip joint: Secondary | ICD-10-CM

## 2015-03-18 DIAGNOSIS — T8484XA Pain due to internal orthopedic prosthetic devices, implants and grafts, initial encounter: Secondary | ICD-10-CM | POA: Diagnosis present

## 2015-03-18 DIAGNOSIS — X58XXXA Exposure to other specified factors, initial encounter: Secondary | ICD-10-CM | POA: Diagnosis not present

## 2015-03-18 MED ORDER — TECHNETIUM TC 99M MEDRONATE IV KIT
23.4200 | PACK | Freq: Once | INTRAVENOUS | Status: AC | PRN
  Administered 2015-03-18: 23.42 via INTRAVENOUS

## 2015-04-03 ENCOUNTER — Ambulatory Visit: Payer: Medicare Other | Admitting: Physical Therapy

## 2015-04-27 ENCOUNTER — Other Ambulatory Visit: Payer: Self-pay | Admitting: Orthopedic Surgery

## 2015-04-27 DIAGNOSIS — M545 Low back pain: Principal | ICD-10-CM

## 2015-04-27 DIAGNOSIS — G8929 Other chronic pain: Secondary | ICD-10-CM

## 2015-05-01 ENCOUNTER — Ambulatory Visit
Admission: RE | Admit: 2015-05-01 | Discharge: 2015-05-01 | Disposition: A | Discharge status: Home / Self Care | Payer: Medicare Other | Source: Ambulatory Visit | Attending: Orthopedic Surgery | Admitting: Orthopedic Surgery

## 2015-05-01 DIAGNOSIS — M5126 Other intervertebral disc displacement, lumbar region: Secondary | ICD-10-CM | POA: Insufficient documentation

## 2015-05-01 DIAGNOSIS — M4806 Spinal stenosis, lumbar region: Secondary | ICD-10-CM | POA: Diagnosis not present

## 2015-05-01 DIAGNOSIS — M545 Low back pain, unspecified: Secondary | ICD-10-CM

## 2015-05-01 DIAGNOSIS — M47816 Spondylosis without myelopathy or radiculopathy, lumbar region: Secondary | ICD-10-CM | POA: Diagnosis not present

## 2015-05-01 DIAGNOSIS — M1288 Other specific arthropathies, not elsewhere classified, other specified site: Secondary | ICD-10-CM | POA: Insufficient documentation

## 2015-05-01 DIAGNOSIS — G8929 Other chronic pain: Secondary | ICD-10-CM | POA: Diagnosis present

## 2015-05-29 DIAGNOSIS — M5116 Intervertebral disc disorders with radiculopathy, lumbar region: Secondary | ICD-10-CM

## 2015-05-29 HISTORY — DX: Intervertebral disc disorders with radiculopathy, lumbar region: M51.16

## 2015-08-09 ENCOUNTER — Emergency Department: Payer: Medicare Other

## 2015-08-09 ENCOUNTER — Emergency Department
Admission: EM | Admit: 2015-08-09 | Discharge: 2015-08-09 | Disposition: A | Discharge status: Home / Self Care | Payer: Medicare Other | Attending: Emergency Medicine | Admitting: Emergency Medicine

## 2015-08-09 DIAGNOSIS — Z791 Long term (current) use of non-steroidal anti-inflammatories (NSAID): Secondary | ICD-10-CM | POA: Diagnosis not present

## 2015-08-09 DIAGNOSIS — Z87891 Personal history of nicotine dependence: Secondary | ICD-10-CM | POA: Diagnosis not present

## 2015-08-09 DIAGNOSIS — N23 Unspecified renal colic: Secondary | ICD-10-CM

## 2015-08-09 DIAGNOSIS — Z79899 Other long term (current) drug therapy: Secondary | ICD-10-CM | POA: Insufficient documentation

## 2015-08-09 DIAGNOSIS — R1012 Left upper quadrant pain: Secondary | ICD-10-CM | POA: Diagnosis present

## 2015-08-09 LAB — URINALYSIS COMPLETE WITH MICROSCOPIC (ARMC ONLY)
Bilirubin Urine: NEGATIVE
Glucose, UA: NEGATIVE mg/dL
KETONES UR: NEGATIVE mg/dL
Leukocytes, UA: NEGATIVE
NITRITE: NEGATIVE
PROTEIN: 30 mg/dL — AB
Specific Gravity, Urine: 1.018 (ref 1.005–1.030)
Squamous Epithelial / LPF: NONE SEEN
pH: 5 (ref 5.0–8.0)

## 2015-08-09 LAB — CBC WITH DIFFERENTIAL/PLATELET
Basophils Absolute: 0 10*3/uL (ref 0–0.1)
Basophils Relative: 0 %
EOS PCT: 1 %
Eosinophils Absolute: 0.1 10*3/uL (ref 0–0.7)
HCT: 45.4 % (ref 40.0–52.0)
Hemoglobin: 15.5 g/dL (ref 13.0–18.0)
LYMPHS ABS: 0.6 10*3/uL — AB (ref 1.0–3.6)
LYMPHS PCT: 8 %
MCH: 30.7 pg (ref 26.0–34.0)
MCHC: 34.2 g/dL (ref 32.0–36.0)
MCV: 89.8 fL (ref 80.0–100.0)
MONO ABS: 0.4 10*3/uL (ref 0.2–1.0)
Monocytes Relative: 5 %
Neutro Abs: 6.7 10*3/uL — ABNORMAL HIGH (ref 1.4–6.5)
Neutrophils Relative %: 86 %
PLATELETS: 164 10*3/uL (ref 150–440)
RBC: 5.06 MIL/uL (ref 4.40–5.90)
RDW: 12.8 % (ref 11.5–14.5)
WBC: 7.9 10*3/uL (ref 3.8–10.6)

## 2015-08-09 LAB — COMPREHENSIVE METABOLIC PANEL
ALBUMIN: 4 g/dL (ref 3.5–5.0)
ALT: 41 U/L (ref 17–63)
AST: 38 U/L (ref 15–41)
Alkaline Phosphatase: 112 U/L (ref 38–126)
Anion gap: 7 (ref 5–15)
BUN: 25 mg/dL — AB (ref 6–20)
CHLORIDE: 105 mmol/L (ref 101–111)
CO2: 27 mmol/L (ref 22–32)
Calcium: 9.4 mg/dL (ref 8.9–10.3)
Creatinine, Ser: 1.38 mg/dL — ABNORMAL HIGH (ref 0.61–1.24)
GFR calc Af Amer: 57 mL/min — ABNORMAL LOW (ref >60)
GFR calc non Af Amer: 49 mL/min — ABNORMAL LOW (ref >60)
GLUCOSE: 119 mg/dL — AB (ref 65–99)
POTASSIUM: 3.7 mmol/L (ref 3.5–5.1)
Sodium: 139 mmol/L (ref 135–145)
Total Bilirubin: 0.3 mg/dL (ref 0.3–1.2)
Total Protein: 6.8 g/dL (ref 6.5–8.1)

## 2015-08-09 LAB — LIPASE, BLOOD: Lipase: 34 U/L (ref 11–51)

## 2015-08-09 MED ORDER — IOHEXOL 240 MG/ML SOLN
25.0000 mL | Freq: Once | INTRAMUSCULAR | Status: AC | PRN
  Administered 2015-08-09: 25 mL via ORAL

## 2015-08-09 MED ORDER — IOHEXOL 300 MG/ML  SOLN
100.0000 mL | Freq: Once | INTRAMUSCULAR | Status: AC | PRN
  Administered 2015-08-09: 100 mL via INTRAVENOUS

## 2015-08-09 MED ORDER — OXYCODONE-ACETAMINOPHEN 5-325 MG PO TABS: 1.0000 | ORAL_TABLET | Freq: Four times a day (QID) | ORAL | Status: DC | PRN

## 2015-08-09 NOTE — Discharge Instructions (Signed)
You have a 6 mm stone in the ureter on the left side. This is likely causing the episodic pain you've been having. Take Percocet if needed for pain control. Follow-up with urology for further treatment. At 6 mm, there is a chance she'll pass this on your own, but she may need assistance from urology. Return to the emergency department if you have uncontrolled pain, fever, or any other urgent concerns.  Kidney Stones Kidney stones (urolithiasis) are solid masses that form inside your kidneys. The intense pain is caused by the stone moving through the kidney, ureter, bladder, and urethra (urinary tract). When the stone moves, the ureter starts to spasm around the stone. The stone is usually passed in your pee (urine).  HOME CARE  Drink enough fluids to keep your pee clear or pale yellow. This helps to get the stone out.  Take a 24-hour pee (urine) sample as told by your doctor. You may need to take another sample every 6-12 months.  Strain all pee through the provided strainer. Do not pee without peeing through the strainer, not even once. If you pee the stone out, catch it in the strainer. The stone may be as small as a grain of salt. Take this to your doctor. This will help your doctor figure out what you can do to try to prevent more kidney stones.  Only take medicine as told by your doctor.  Make changes to your daily diet as told by your doctor. You may be told to:  Limit how much salt you eat.  Eat 5 or more servings of fruits and vegetables each day.  Limit how much meat, poultry, fish, and eggs you eat.  Keep all follow-up visits as told by your doctor. This is important.  Get follow-up X-rays as told by your doctor. GET HELP IF: You have pain that gets worse even if you have been taking pain medicine. GET HELP RIGHT AWAY IF:   Your pain does not get better with medicine.  You have a fever or shaking chills.  Your pain increases and gets worse over 18 hours.  You have new  belly (abdominal) pain.  You feel faint or pass out.  You are unable to pee.   This information is not intended to replace advice given to you by your health care provider. Make sure you discuss any questions you have with your health care provider.   Document Released: 02/08/2008 Document Revised: 05/13/2015 Document Reviewed: 01/23/2013 Elsevier Interactive Patient Education [employer].

## 2015-08-09 NOTE — ED Notes (Signed)
Pt arrives POV c/o left sided lower abdominal and flank pain since 0130AM. Denies VD, mild nausea. Pt alert and oriented X4, active, cooperative, pt in NAD. RR even and unlabored, color WNL.

## 2015-08-09 NOTE — ED Notes (Signed)
Pt states has history of kidney stones and gallstones

## 2015-08-09 NOTE — ED Provider Notes (Signed)
[facility] Emergency Department Provider Note  ____________________________________________  Time seen: 78   I have reviewed the triage vital signs and the nursing notes.  History by:  Patient and wife  HISTORY  Chief Complaint Abdominal Pain     HPI Stephen Cervantes is a 74 y.o. 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 He subsequently had a delayed closure and then later mesh.  The patient presents emergency department due to pain in his left upper abdomen that began approximately 1:30 AM. This is contrary to the triage note which reports left-sided lower abdominal pain. The patient points to a very focal area just under his anterior left ribs as the area of discomfort. He reports it is feeling better now.  He denies any vomiting. He did have some mild nausea. He feels significantly better at this time.    Past Medical History  Diagnosis Date  . Arthritis   . Thyroid disease   . Kidney stone   . Enlarged prostate   . Bowel obstruction ([place]) [id number]  . Diverticulitis 2006    Patient Active Problem List   Diagnosis Date Noted  . Left flank pain 07/04/2014  . Gallstones 07/04/2014    Past Surgical History  Procedure Laterality Date  . Ileostomy  2007  . Knee surgery Right 2011  . Joint replacement  2014    Hip Replacement [facility]  . Hernia repair  2008?    [facility]  . Colectomy  2006    Abdominal colectomy, ileostomy, Hartman procedure for perforation of cecum, sigmoid diverticulitis    Current Outpatient Rx  Name  Route  Sig  Dispense  Refill  . calcium-vitamin D 250-100 MG-UNIT per tablet   Oral   Take 1 tablet by mouth 2 (two) times daily.         . celecoxib (CELEBREX) 200 MG capsule      200 mg daily.          . cephALEXin (KEFLEX) 500 MG capsule   Oral   Take 500 mg by mouth as needed.         .  hydrochlorothiazide (HYDRODIURIL) 25 MG tablet   Oral   Take 25 mg by mouth daily.          Marland Kitchen levothyroxine (SYNTHROID, LEVOTHROID) 100 MCG tablet   Oral   Take 100 mcg by mouth daily before breakfast.          . loperamide (IMODIUM) 2 MG capsule   Oral   Take by mouth as needed for diarrhea or loose stools.         Marland Kitchen loratadine (CLARITIN) 10 MG tablet   Oral   Take 10 mg by mouth daily.         . Lutein 20 MG CAPS   Oral   Take 1 capsule by mouth daily.         . montelukast (SINGULAIR) 10 MG tablet   Oral   Take 10 mg by mouth at bedtime.          . Multiple Vitamin (MULTI VITAMIN DAILY PO)   Oral   Take 1 tablet by mouth daily.         . Omega-3 Fatty Acids (FISH OIL) 1000 MG CAPS   Oral   Take 1 capsule by mouth daily.         Marland Kitchen oxyCODONE-acetaminophen (ROXICET) 5-325  MG tablet   Oral   Take 1 tablet by mouth every 6 (six) hours as needed.   20 tablet   0   . potassium citrate (UROCIT-K) 10 MEQ (1080 MG) SR tablet   Oral   Take 10 mEq by mouth 3 (three) times daily with meals.            Allergies Morphine and related and Tape  Family History  Problem Relation Age of Onset  . Cancer Father     Social History Social History  Substance Use Topics  . Smoking status: Former Smoker -- 19 years  . Smokeless tobacco: None  . Alcohol Use: No    Review of Systems  Constitutional: Negative for fever/chills. ENT: Negative for congestion. Cardiovascular: Negative for chest pain. Respiratory: Negative for cough. Gastrointestinal: Pain in left upper abdomen, improved. See history of present illness Genitourinary: Negative for dysuria. Musculoskeletal: No back pain. Skin: Negative for rash. Neurological: Negative for headache or focal weakness   10-point ROS otherwise negative.  ____________________________________________   PHYSICAL EXAM:  VITAL SIGNS: ED Triage Vitals  Enc Vitals Group     BP 08/09/15 0830 132/79 mmHg      Pulse Rate 08/09/15 0830 75     Resp 08/09/15 0830 18     Temp 08/09/15 0830 98.3 F (36.8 C)     Temp Source 08/09/15 0830 Oral     SpO2 08/09/15 0830 98 %     Weight 08/09/15 0830 175 lb (79.379 kg)     Height 08/09/15 0830 5\' 10"  (1.778 m)     Head Cir --      Peak Flow --      Pain Score 08/09/15 0831 3     Pain Loc --      Pain Edu? --      Excl. in [place]? --     Constitutional: Alert and oriented. Well appearing and in no distress. ENT   Head: Normocephalic and atraumatic.   Nose: No congestion/rhinnorhea.       Mouth: No erythema, no swelling   Cardiovascular: Normal rate, regular rhythm, no murmur noted Respiratory:  Normal respiratory effort, no tachypnea.    Breath sounds are clear and equal bilaterally.  Gastrointestinal:  Notable surgical scars. Soft, no distention. Nontender. The patient does identify a focal area just under the left anterior ribs as the area that had the discomfort. He reports he still feels just a little 20 of discomfort in that area. Palpation of the area appears benign and he appears to be in no distress. Back: No muscle spasm, no tenderness, no CVA tenderness. Musculoskeletal: No deformity noted. Nontender with normal range of motion in all extremities.  No noted edema. Neurologic:  Communicative. Normal appearing spontaneous movement in all 4 extremities. No gross focal neurologic deficits are appreciated.  Skin:  Skin is warm, dry. No rash noted. Psychiatric: Mood and affect are normal. Speech and behavior are normal. Very pleasant and interactive. ____________________________________________    LABS (pertinent positives/negatives)  Labs Reviewed  COMPREHENSIVE METABOLIC PANEL - Abnormal; Notable for the following:    Glucose, Bld 119 (*)    BUN 25 (*)    Creatinine, Ser 1.38 (*)    GFR calc non Af Amer 49 (*)    GFR calc Af Amer 57 (*)    All other components within normal limits  CBC WITH DIFFERENTIAL/PLATELET - Abnormal; Notable  for the following:    Neutro Abs 6.7 (*)    Lymphs Abs  0.6 (*)    All other components within normal limits  URINALYSIS COMPLETEWITH MICROSCOPIC ([facility] ONLY) - Abnormal; Notable for the following:    Color, Urine YELLOW (*)    APPearance CLOUDY (*)    Hgb urine dipstick 3+ (*)    Protein, ur 30 (*)    Bacteria, UA RARE (*)    All other components within normal limits  LIPASE, BLOOD     ____________________________________________   EKG  ED ECG REPORT I, Genevieve Arbaugh W, the attending physician, personally viewed and interpreted this ECG.   Date: 08/09/2015  EKG Time: 9:15  Rate: 72  Rhythm: sinus rhythm  Axis: Left axis at -23  Intervals: PR is 225  ST&T Change: None noted   ____________________________________________   Radiology:  CT abdomen and pelvis:  IMPRESSION: 1. Obstructing 6 mm calculus in the mid left ureter. 2. Bilateral nephrolithiasis. 3. Additional incidental findings include the presence of cholelithiasis, bilateral renal cystic lesions, postsurgical changes from previous subtotal colectomy and atherosclerosis.  ____________________________________________   INITIAL IMPRESSION / ASSESSMENT AND PLAN / ED COURSE  Pertinent labs & imaging results that were available during my care of the patient were reviewed by me and considered in my medical decision making (see chart for details).  Overall well-appearing 74 year old male in no acute distress. He reports that his symptoms are feeling much better now. We've discussed the pros and cons of further evaluation. I do not see any indication for CT scan at this time, despite his complex surgical history. We will check basic blood tests and continue to observe the patient in the emergency department. If he continues to do well, as he is now, we will discharge him with follow-up to his primary physician.  ----------------------------------------- 10:35 AM on  08/09/2015 -----------------------------------------  Blood tests overall appear reasonable. His BUN is slightly elevated at 25 with creatinine of 1.38. White blood cell count is normal.  On reexamination, the patient looks well and is continuing to smile and be in no acute distress, however, he reports that the pain has come back some.  Urinalysis is still pending. We will continue to observe the patient. I have offered CT scan to the patient, but with the explanation that, given his fairly benign appearance, on hasn't to expose him to the radiation. Unsure he's had many CTs in the past with his complex history. Right now the patient agrees about not getting a CT.  ----------------------------------------- 12:23 PM on 08/09/2015 -----------------------------------------  At approximately 12:00, the patient had a significant flare and pain again. At that point we agreed that a CT scan was appropriate. That has now been ordered and is pending.  Currently, the patient again appears overall comfortable.  ----------------------------------------- 2:59 PM on 08/09/2015 -----------------------------------------  Patient is 6 mm stone in the left ureter. Of note, his urine has red blood cells too numerous to count.  We will discharge the patient with appropriate pain medication. I've instructed him to follow-up with the urologist. Edwena Bunde given him return precautions.  ____________________________________________   FINAL CLINICAL IMPRESSION(S) / ED DIAGNOSES  Final diagnoses:  Renal colic on left side       Ahmed Prima, MD 08/09/15 [phone]

## 2015-08-11 ENCOUNTER — Encounter: Payer: Self-pay | Admitting: Urology

## 2015-08-11 ENCOUNTER — Ambulatory Visit
Admission: RE | Admit: 2015-08-11 | Discharge: 2015-08-11 | Disposition: A | Discharge status: Home / Self Care | Payer: Medicare Other | Source: Ambulatory Visit | Attending: Urology | Admitting: Urology

## 2015-08-11 ENCOUNTER — Ambulatory Visit (INDEPENDENT_AMBULATORY_CARE_PROVIDER_SITE_OTHER): Payer: Medicare Other | Admitting: Urology

## 2015-08-11 VITALS — BP 139/78 | HR 66 | Ht 70.0 in | Wt 180.5 lb

## 2015-08-11 DIAGNOSIS — N132 Hydronephrosis with renal and ureteral calculous obstruction: Secondary | ICD-10-CM

## 2015-08-11 DIAGNOSIS — N201 Calculus of ureter: Secondary | ICD-10-CM

## 2015-08-11 DIAGNOSIS — N200 Calculus of kidney: Secondary | ICD-10-CM | POA: Diagnosis present

## 2015-08-11 DIAGNOSIS — R3129 Other microscopic hematuria: Secondary | ICD-10-CM | POA: Diagnosis not present

## 2015-08-11 LAB — URINALYSIS, COMPLETE
Bilirubin, UA: NEGATIVE
Glucose, UA: NEGATIVE
Ketones, UA: NEGATIVE
Leukocytes, UA: NEGATIVE
NITRITE UA: NEGATIVE
PH UA: 5.5 (ref 5.0–7.5)
Specific Gravity, UA: 1.025 (ref 1.005–1.030)
UUROB: 0.2 mg/dL (ref 0.2–1.0)

## 2015-08-11 LAB — MICROSCOPIC EXAMINATION

## 2015-08-11 MED ORDER — SULFAMETHOXAZOLE-TRIMETHOPRIM 800-160 MG PO TABS: 1.0000 | ORAL_TABLET | Freq: Two times a day (BID) | ORAL | Status: DC

## 2015-08-11 NOTE — Progress Notes (Signed)
08/11/2015 11:58 AM   [name] 1941/08/23 [id number]  Referring provider: Jodi Marble, MD [address] [place], [place] [address]  Chief Complaint  Patient presents with  . Nephrolithiasis    er referral    HPI: Patient is a 74 year old white male with a history of nephrolithiasis who was seen in the ED on 08/09/2015 for the sudden onset of left sided pain that was located up under his ribs.  He felt like the pain was reminiscent of previous stones he had in the past, but he was concerned that the pain was in a different location.  He then sought treatment in the ED for further evaluation.    In the ED, a CT of the abdomen and pelvis with contrast was performed and an obstructing 6 mm calculus in the mid left ureter was discovered.  He was also having dark colored urine, but no gross hematuria.  The pain changed position while he was in the ED from under the ribs to lower in the left side.    Today, he is not experiencing any pain.  He has passed some small fragments.  His urine is still a dark color.  He is not having any difficulty with urination at this time.    He has had endoscopic procedures in the past for his stones.  He has an unknown stone composition.  He is not wanting to have another endoscopic procedure at this time.  He is leaning towards ESWL.     PMH: Past Medical History  Diagnosis Date  . Arthritis   . Thyroid disease   . Kidney stone   . Enlarged prostate   . Bowel obstruction ([place]) [id number]  . Diverticulitis 2006  . Asthma   . Depression   . HLD (hyperlipidemia)   . HTN (hypertension)     Surgical History: Past Surgical History  Procedure Laterality Date  . Ileostomy  2007  . Knee surgery Right 2011  . Joint replacement  2014    Hip Replacement [facility]ie Knows  . Hernia repair  2008?    [facility]  . Colectomy  2006    Abdominal colectomy, ileostomy, Hartman procedure for perforation of cecum, sigmoid  diverticulitis    Home Medications:    Medication List       This list is accurate as of: 08/11/15 11:58 AM.  Always use your most recent med list.               calcium-vitamin D 250-100 MG-UNIT tablet  Take 1 tablet by mouth 2 (two) times daily.     celecoxib 200 MG capsule  Commonly known as:  CELEBREX  200 mg daily.     cephALEXin 500 MG capsule  Commonly known as:  KEFLEX  Take 500 mg by mouth as needed.     cholestyramine 4 G packet  Commonly known as:  QUESTRAN     Fish Oil 1000 MG Caps  Take 1 capsule by mouth daily.     hydrochlorothiazide 25 MG tablet  Commonly known as:  HYDRODIURIL  Take 25 mg by mouth daily.     levothyroxine 137 MCG tablet  Commonly known as:  SYNTHROID, LEVOTHROID     LOMOTIL 2.5-0.025 MG tablet  Generic drug:  diphenoxylate-atropine  use as directed     loperamide 2 MG capsule  Commonly known as:  IMODIUM  Take by mouth as needed for diarrhea or loose stools.  loratadine 10 MG tablet  Commonly known as:  CLARITIN  Take 10 mg by mouth daily.     Lutein 20 MG Caps  Take 1 capsule by mouth daily.     montelukast 10 MG tablet  Commonly known as:  SINGULAIR  Take 10 mg by mouth at bedtime.     MULTI VITAMIN DAILY PO  Take 1 tablet by mouth daily.     oxyCODONE-acetaminophen 5-325 MG tablet  Commonly known as:  ROXICET  Take 1 tablet by mouth every 6 (six) hours as needed.     potassium citrate 10 MEQ (1080 MG) SR tablet  Commonly known as:  UROCIT-K  Take 10 mEq by mouth 3 (three) times daily with meals.     sulfamethoxazole-trimethoprim 800-160 MG tablet  Commonly known as:  BACTRIM DS,SEPTRA DS  Take 1 tablet by mouth every 12 (twelve) hours.     Vitamin D (Cholecalciferol) 400 UNITS Caps  Take by mouth.        Allergies:  Allergies  Allergen Reactions  . Morphine And Related Other (See Comments)    Severe hallucinations  . Tape Other (See Comments)    Silk Tape per patient "peels off my skin"     Family History: Family History  Problem Relation Age of Onset  . Cancer Father   . Kidney disease Neg Hx   . Prostate cancer Neg Hx   . Cancer Brother     mesophileoma    Social History:  reports that he has quit smoking. He does not have any smokeless tobacco history on file. He reports that he does not drink alcohol or use illicit drugs.  ROS: UROLOGY Frequent Urination?: No Hard to postpone urination?: No Burning/pain with urination?: No Get up at night to urinate?: No Leakage of urine?: No Urine stream starts and stops?: No Trouble starting stream?: No Do you have to strain to urinate?: No Blood in urine?: Yes Urinary tract infection?: No Sexually transmitted disease?: No Injury to kidneys or bladder?: No Painful intercourse?: No Weak stream?: No Erection problems?: No Penile pain?: No  Gastrointestinal Nausea?: Yes Vomiting?: No Indigestion/heartburn?: No Diarrhea?: Yes Constipation?: No  Constitutional Fever: No Night sweats?: No Weight loss?: No Fatigue?: Yes  Skin Skin rash/lesions?: No Itching?: No  Eyes Blurred vision?: No Double vision?: No  Ears/Nose/Throat Sore throat?: Yes Sinus problems?: No  Hematologic/Lymphatic Swollen glands?: No Easy bruising?: No  Cardiovascular Leg swelling?: No Chest pain?: No  Respiratory Cough?: Yes Shortness of breath?: No  Endocrine Excessive thirst?: No  Musculoskeletal Back pain?: Yes Joint pain?: No  Neurological Headaches?: Yes Dizziness?: No  Psychologic Depression?: No Anxiety?: No  Physical Exam: BP 139/78 mmHg  Pulse 66  Ht 5\' 10"  (1.778 m)  Wt 180 lb 8 oz (81.874 kg)  BMI 25.90 kg/m2  Constitutional: Well nourished. Alert and oriented, No acute distress. HEENT: [place] AT, moist mucus membranes. Trachea midline, no masses. Cardiovascular: No clubbing, cyanosis, or edema. Respiratory: Normal respiratory effort, no increased work of breathing. GI: Abdomen is soft, non  tender, non distended, no abdominal masses. Liver and spleen not palpable.  No hernias appreciated.  Stool sample for occult testing is not indicated.   GU: No CVA tenderness.  No bladder fullness or masses.  Patient with circumcised phallus.  Urethral meatus is patent.  No penile discharge. No penile lesions or rashes. Scrotum without lesions, cysts, rashes and/or edema.  Testicles are located scrotally bilaterally. No masses are appreciated in the testicles. Left and right  epididymis are normal. Rectal: Patient with  normal sphincter tone. Anus and perineum without scarring or rashes. No rectal masses are appreciated. Prostate is approximately 45 grams, no nodules are appreciated. Seminal vesicles are normal. Skin: No rashes, bruises or suspicious lesions. Lymph: No cervical or inguinal adenopathy. Neurologic: Grossly intact, no focal deficits, moving all 4 extremities. Psychiatric: Normal mood and affect.  Laboratory Data: Lab Results  Component Value Date   WBC 7.9 08/09/2015   HGB 15.5 08/09/2015   HCT 45.4 08/09/2015   MCV 89.8 08/09/2015   PLT 164 08/09/2015    Lab Results  Component Value Date   CREATININE 1.38* 08/09/2015   Urinalysis Results for orders placed or performed in visit on 08/11/15  CULTURE, URINE COMPREHENSIVE  Result Value Ref Range   Urine Culture, Comprehensive Final report    Result 1 Comment   Microscopic Examination  Result Value Ref Range   WBC, UA 0-5 0 -  5 /hpf   RBC, UA >30 (H) 0 -  2 /hpf   Epithelial Cells (non renal) 0-10 0 - 10 /hpf   Mucus, UA Present (A) Not Estab.   Bacteria, UA Moderate (A) None seen/Few  Urinalysis, Complete  Result Value Ref Range   Specific Gravity, UA 1.025 1.005 - 1.030   pH, UA 5.5 5.0 - 7.5   Color, UA Amber (A) Yellow   Appearance Ur Cloudy (A) Clear   Leukocytes, UA Negative Negative   Protein, UA Trace (A) Negative/Trace   Glucose, UA Negative Negative   Ketones, UA Negative Negative   RBC, UA 3+ (A)  Negative   Bilirubin, UA Negative Negative   Urobilinogen, Ur 0.2 0.2 - 1.0 mg/dL   Nitrite, UA Negative Negative   Microscopic Examination See below:     Pertinent Imaging: CLINICAL DATA: Left flank pain with nausea for 1 day. History of total colectomy.  EXAM: CT ABDOMEN AND PELVIS WITH CONTRAST  TECHNIQUE: Multidetector CT imaging of the abdomen and pelvis was performed using the standard protocol following bolus administration of intravenous contrast.  CONTRAST: 136mL OMNIPAQUE IOHEXOL 300 MG/ML SOLN  COMPARISON: CT 09/01/2012.  FINDINGS: Lower chest: There is mild scarring at the lung bases, similar to the prior examination. There is some pleural calcification on the left and evidence of mild emphysema. No significant pleural or pericardial effusion.  Hepatobiliary: The liver is normal in density without focal abnormality. There are multiple small gallstones. The gallbladder is incompletely distended, but demonstrates no wall thickening or surrounding inflammation. Appearance is similar to the prior study. There is no biliary dilatation.  Pancreas: Unremarkable. No pancreatic ductal dilatation or surrounding inflammatory changes.  Spleen: Normal in size without focal abnormality.  Adrenals/Urinary Tract: Both adrenal glands appear normal. There are nonobstructing renal calyceal calculi bilaterally, measuring up to 7 mm posteriorly in the mid right kidney on image 35 and up to 11 mm in the lower pole of the left kidney on image 42. There is an obstructing calculus in the mid left ureter, measuring 6 mm on coronal image 66. There is associated left-sided hydronephrosis and delayed contrast excretion. Perinephric soft tissue stranding is slightly worse on the left. Several low-density renal lesions bilaterally are incompletely characterized by this examination, although the larger ones are similar to the prior study, and these are probably all cysts.  No suspicious renal mass or bladder abnormality identified.  Stomach/Bowel: No evidence of bowel wall thickening, distention or surrounding inflammatory change. Status post subtotal colectomy with apparent ileorectal anastomosis.  Vascular/Lymphatic:  Small retroperitoneal lymph nodes are unchanged. There is atherosclerosis of the aorta, its branches and the iliac arteries. No evidence of large vessel occlusion or significant venous abnormality.  Reproductive: Stable mild enlargement of the prostate gland and mild asymmetry of the seminal vesicles.  Other: Postsurgical changes in the anterior abdominal wall. No evidence of hernia.  Musculoskeletal: Right total hip arthroplasty. No acute osseous findings. Degenerative changes are present throughout the lumbar spine associate with a mild convex left scoliosis.  IMPRESSION: 1. Obstructing 6 mm calculus in the mid left ureter. 2. Bilateral nephrolithiasis. 3. Additional incidental findings include the presence of cholelithiasis, bilateral renal cystic lesions, postsurgical changes from previous subtotal colectomy and atherosclerosis.   Electronically Signed  By: Richardean Sale M.D.  On: 08/09/2015 14:27  CLINICAL DATA: Subsequent evaluation 6 mm stone mid left ureter  EXAM: ABDOMEN - 1 VIEW  COMPARISON: 08/09/2015 CT scan  FINDINGS: Bowel gas pattern within normal limits. Left ureteral calculus not definitively seen.  IMPRESSION: Left ureteral calculus not confidently identified.   Electronically Signed  By: Skipper Cliche M.D.  On: 08/11/2015 11:53   Assessment & Plan:    1. Left ureteral stone:   Patient feels he may have passed the stone.  I will obtain a KUB today.  If the stone is visible on KUB, the patient would like to pursue ESWL as definitive treatment for the stone.  The stone is not visible on KUB.  He will undergo a RUS in the next few days.  He will contact the office or seek  treatment in the ED for fevers or chills, intractable pain or vomiting in the interim.    - Urinalysis, Complete  2. Hydronephrosis:   Patient will undergo RUS after treatment of the stone to ensure the hydronephrosis has resolved.    3. Microscopic hematuria:   Patient with >30 RBC's/hpf on today's UA.  I will sent the urine for culture.  We will continue to monitor to ensure the hematuria resolves once the stone has been treated.  His UA also had many bacteria as well.  I will start Bactrim DS empirically until culture results are available.    - CULTURE, URINE COMPREHENSIVE  Return for RUS report .  Zara Council, [facility] [address] [place], [place] [address] [phone]

## 2015-08-13 LAB — CULTURE, URINE COMPREHENSIVE

## 2015-08-14 ENCOUNTER — Telehealth: Payer: Self-pay

## 2015-08-14 NOTE — Telephone Encounter (Signed)
Yes renal u/s is scheduled for 08/18/15 @ 10:30.

## 2015-08-14 NOTE — Telephone Encounter (Signed)
-----   Message from Nori Riis, PA-C sent at 08/14/2015  8:51 AM EST ----- Patient's urine culture is negative.  He needs to have a renal ultrasound.  Has it been scheduled?

## 2015-08-16 DIAGNOSIS — N201 Calculus of ureter: Secondary | ICD-10-CM | POA: Insufficient documentation

## 2015-08-16 DIAGNOSIS — N132 Hydronephrosis with renal and ureteral calculous obstruction: Secondary | ICD-10-CM | POA: Insufficient documentation

## 2015-08-16 DIAGNOSIS — R3129 Other microscopic hematuria: Secondary | ICD-10-CM | POA: Insufficient documentation

## 2015-08-18 ENCOUNTER — Ambulatory Visit
Admission: RE | Admit: 2015-08-18 | Discharge: 2015-08-18 | Disposition: A | Discharge status: Home / Self Care | Payer: Medicare Other | Source: Ambulatory Visit | Attending: Urology | Admitting: Urology

## 2015-08-18 DIAGNOSIS — N400 Enlarged prostate without lower urinary tract symptoms: Secondary | ICD-10-CM | POA: Diagnosis not present

## 2015-08-18 DIAGNOSIS — N200 Calculus of kidney: Secondary | ICD-10-CM | POA: Diagnosis not present

## 2015-08-18 DIAGNOSIS — N201 Calculus of ureter: Secondary | ICD-10-CM | POA: Diagnosis present

## 2015-08-19 ENCOUNTER — Telehealth: Payer: Self-pay

## 2015-08-19 NOTE — Telephone Encounter (Signed)
Spoke with pt in reference to RUS. Pt stated that he does not want tx of other stones. Pt stated he will keep appt on 08/26/15.

## 2015-08-19 NOTE — Telephone Encounter (Signed)
-----   Message from Nori Riis, PA-C sent at 08/18/2015  4:47 PM EST ----- Patient's RUS demonstrated that his kidney is no longer swollen on the left.  If he is not having any further kidney stone pain, he may have passed that one stone.  He has several others in each kidney.  We do need to make sure the blood in his urine is no longer present.  He may also want to discuss treated his other stones.  He can have an appointment with any of the providers (not MacDiarmid because he doesn't treat stones) to discuss his stones.

## 2015-08-21 ENCOUNTER — Encounter: Payer: Self-pay | Admitting: Urology

## 2015-08-25 ENCOUNTER — Telehealth: Payer: Self-pay

## 2015-08-25 NOTE — Telephone Encounter (Signed)
-----   Message from Nori Riis, PA-C sent at 08/23/2015  8:06 PM EST ----- Patient's stone was composed of uric acid.  We can send him the low purine diet information from the [employer].  It will give him the foods to avoid.

## 2015-08-25 NOTE — Telephone Encounter (Signed)
Spoke with pt in reference to stone composition. Pt voiced understanding. Will print diet and give to pt at appt tomorrow.

## 2015-08-26 ENCOUNTER — Ambulatory Visit (INDEPENDENT_AMBULATORY_CARE_PROVIDER_SITE_OTHER): Payer: Medicare Other | Admitting: Urology

## 2015-08-26 ENCOUNTER — Encounter: Payer: Self-pay | Admitting: Urology

## 2015-08-26 VITALS — BP 127/85 | HR 65 | Ht 70.0 in | Wt 182.9 lb

## 2015-08-26 DIAGNOSIS — N132 Hydronephrosis with renal and ureteral calculous obstruction: Secondary | ICD-10-CM | POA: Diagnosis not present

## 2015-08-26 DIAGNOSIS — N200 Calculus of kidney: Secondary | ICD-10-CM | POA: Diagnosis not present

## 2015-08-26 DIAGNOSIS — R3129 Other microscopic hematuria: Secondary | ICD-10-CM | POA: Diagnosis not present

## 2015-08-26 LAB — MICROSCOPIC EXAMINATION: EPITHELIAL CELLS (NON RENAL): NONE SEEN /HPF (ref 0–10)

## 2015-08-26 LAB — URINALYSIS, COMPLETE
Bilirubin, UA: NEGATIVE
Glucose, UA: NEGATIVE
Ketones, UA: NEGATIVE
LEUKOCYTES UA: NEGATIVE
Nitrite, UA: NEGATIVE
PH UA: 5.5 (ref 5.0–7.5)
PROTEIN UA: NEGATIVE
Specific Gravity, UA: 1.025 (ref 1.005–1.030)
Urobilinogen, Ur: 0.2 mg/dL (ref 0.2–1.0)

## 2015-08-26 NOTE — Progress Notes (Signed)
9:46 AM   Stephen Cervantes [birth year] [id number]  Referring provider: Jodi Marble, MD [address] [place], [place] [address]  Chief Complaint  Patient presents with  . Follow-up    ultrasound     HPI: Patient is a 74 year old Caucasian male with a bilateral nephrolithiasis, a history of a left ureter stone and microscopic hematuria who presents today for RUS results.    Background story Patient is a 74 year old white male with a history of nephrolithiasis who was seen in the ED on 08/09/2015 for the sudden onset of left sided pain that was located up under his ribs.  He felt like the pain was reminiscent of previous stones he had in the past, but he was concerned that the pain was in a different location.  He then sought treatment in the ED for further evaluation.  In the ED, a CT of the abdomen and pelvis with contrast was performed and an obstructing 6 mm calculus in the mid left ureter was discovered.  He was also having dark colored urine, but no gross hematuria.  The pain changed position while he was in the ED from under the ribs to lower in the left side.  When he saw [place], he was not experiencing any pain.  He has passed some small fragments.  His urine is still a dark color.  He is not having any difficulty with urination at this time.  He has had endoscopic procedures in the past for his stones.    Today, he has not experienced any gross hematuria.  He has not had any further flank pain.  He is not having any associated fevers, chills, nausea or vomiting.  RUS performed on 08/18/2015 noted multiple bilateral renal calyceal non obstructing stones.  The left hydronephrosis has resolved. The prostate is enlarged.  I have reviewed the RUS with the patient and his partner, [name].  Stone analysis noted uric acid stones.     PMH: Past Medical History  Diagnosis Date  . Arthritis   . Thyroid disease   . Kidney stone   . Enlarged prostate   . Bowel obstruction ([place]) [id number]    . Diverticulitis 2006  . Asthma   . Depression   . HLD (hyperlipidemia)   . HTN (hypertension)   . Gallstones 07/04/2014  . Neuritis or radiculitis due to rupture of lumbar intervertebral disc 05/29/2015    Surgical History: Past Surgical History  Procedure Laterality Date  . Ileostomy  2007  . Knee surgery Right 2011  . Joint replacement  2014    Hip Replacement [facility] Knows  . Hernia repair  2008?    [place]  . Colectomy  2006    Abdominal colectomy, ileostomy, Hartman procedure for perforation of cecum, sigmoid diverticulitis    Home Medications:    Medication List       This list is accurate as of: 08/26/15  9:46 AM.  Always use your most recent med list.               calcium-vitamin D 250-100 MG-UNIT tablet  Take 1 tablet by mouth 2 (two) times daily.     celecoxib 200 MG capsule  Commonly known as:  CELEBREX  200 mg daily.     cholestyramine 4 G packet  Commonly known as:  QUESTRAN     Fish Oil 1000 MG Caps  Take 1 capsule by mouth daily.     hydrochlorothiazide 25  MG tablet  Commonly known as:  HYDRODIURIL  Take 25 mg by mouth daily.     levothyroxine 137 MCG tablet  Commonly known as:  SYNTHROID, LEVOTHROID     LOMOTIL 2.5-0.025 MG tablet  Generic drug:  diphenoxylate-atropine  use as directed     loperamide 2 MG capsule  Commonly known as:  IMODIUM  Take by mouth as needed for diarrhea or loose stools.     loratadine 10 MG tablet  Commonly known as:  CLARITIN  Take 10 mg by mouth daily.     Lutein 20 MG Caps  Take 1 capsule by mouth daily.     montelukast 10 MG tablet  Commonly known as:  SINGULAIR  Take 10 mg by mouth at bedtime.     MULTI VITAMIN DAILY PO  Take 1 tablet by mouth daily.     oxyCODONE-acetaminophen 5-325 MG tablet  Commonly known as:  ROXICET  Take 1 tablet by mouth every 6 (six) hours as needed.     potassium citrate 10 MEQ (1080 MG) SR tablet  Commonly known as:  UROCIT-K  Take 10  mEq by mouth 3 (three) times daily with meals.     Vitamin D (Cholecalciferol) 400 UNITS Caps  Take by mouth.        Allergies:  Allergies  Allergen Reactions  . Morphine And Related Other (See Comments)    Severe hallucinations  . Tape Other (See Comments)    Silk Tape per patient "peels off my skin"    Family History: Family History  Problem Relation Age of Onset  . Cancer Father   . Kidney disease Neg Hx   . Prostate cancer Neg Hx   . Cancer Brother     mesophileoma    Social History:  reports that he has quit smoking. He does not have any smokeless tobacco history on file. He reports that he does not drink alcohol or use illicit drugs.  ROS: UROLOGY Frequent Urination?: No Hard to postpone urination?: No Burning/pain with urination?: No Get up at night to urinate?: No Leakage of urine?: No Urine stream starts and stops?: No Trouble starting stream?: No Do you have to strain to urinate?: No Blood in urine?: No Urinary tract infection?: No Sexually transmitted disease?: No Injury to kidneys or bladder?: No Painful intercourse?: No Weak stream?: No Erection problems?: No Penile pain?: No  Gastrointestinal Nausea?: No Vomiting?: No Indigestion/heartburn?: No Diarrhea?: No Constipation?: No  Constitutional Fever: No Night sweats?: No Weight loss?: No Fatigue?: No  Skin Skin rash/lesions?: No Itching?: No  Eyes Blurred vision?: No Double vision?: No  Ears/Nose/Throat Sore throat?: No Sinus problems?: No  Hematologic/Lymphatic Swollen glands?: No Easy bruising?: No  Cardiovascular Leg swelling?: No Chest pain?: No  Respiratory Cough?: No Shortness of breath?: No  Endocrine Excessive thirst?: No  Musculoskeletal Back pain?: No Joint pain?: No  Neurological Headaches?: No Dizziness?: No  Psychologic Depression?: No Anxiety?: No  Physical Exam: BP 127/85 mmHg  Pulse 65  Ht 5\' 10"  (1.778 m)  Wt 182 lb 14.4 oz (82.963 kg)   BMI 26.24 kg/m2  Constitutional: Well nourished. Alert and oriented, No acute distress. HEENT: [place] AT, moist mucus membranes. Trachea midline, no masses. Cardiovascular: No clubbing, cyanosis, or edema. Respiratory: Normal respiratory effort, no increased work of breathing. Skin: No rashes, bruises or suspicious lesions. Lymph: No cervical or inguinal adenopathy. Neurologic: Grossly intact, no focal deficits, moving all 4 extremities. Psychiatric: Normal mood and affect.  Laboratory Data: Lab Results  Component Value Date   WBC 7.9 08/09/2015   HGB 15.5 08/09/2015   HCT 45.4 08/09/2015   MCV 89.8 08/09/2015   PLT 164 08/09/2015    Lab Results  Component Value Date   CREATININE 1.38* 08/09/2015   Urinalysis Results for orders placed or performed in visit on 08/26/15  Microscopic Examination  Result Value Ref Range   WBC, UA 0-5 0 -  5 /hpf   RBC, UA 3-10 (A) 0 -  2 /hpf   Epithelial Cells (non renal) None seen 0 - 10 /hpf   Mucus, UA Present (A) Not Estab.   Bacteria, UA Few (A) None seen/Few  Urinalysis, Complete  Result Value Ref Range   Specific Gravity, UA 1.025 1.005 - 1.030   pH, UA 5.5 5.0 - 7.5   Color, UA Yellow Yellow   Appearance Ur Clear Clear   Leukocytes, UA Negative Negative   Protein, UA Negative Negative/Trace   Glucose, UA Negative Negative   Ketones, UA Negative Negative   RBC, UA 1+ (A) Negative   Bilirubin, UA Negative Negative   Urobilinogen, Ur 0.2 0.2 - 1.0 mg/dL   Nitrite, UA Negative Negative   Microscopic Examination See below:     Pertinent Imaging: CLINICAL DATA: Left flank pain with nausea for 1 day. History of total colectomy.  EXAM: CT ABDOMEN AND PELVIS WITH CONTRAST  TECHNIQUE: Multidetector CT imaging of the abdomen and pelvis was performed using the standard protocol following bolus administration of intravenous contrast.  CONTRAST: 153mL OMNIPAQUE IOHEXOL 300 MG/ML SOLN  COMPARISON: CT  09/01/2012.  FINDINGS: Lower chest: There is mild scarring at the lung bases, similar to the prior examination. There is some pleural calcification on the left and evidence of mild emphysema. No significant pleural or pericardial effusion.  Hepatobiliary: The liver is normal in density without focal abnormality. There are multiple small gallstones. The gallbladder is incompletely distended, but demonstrates no wall thickening or surrounding inflammation. Appearance is similar to the prior study. There is no biliary dilatation.  Pancreas: Unremarkable. No pancreatic ductal dilatation or surrounding inflammatory changes.  Spleen: Normal in size without focal abnormality.  Adrenals/Urinary Tract: Both adrenal glands appear normal. There are nonobstructing renal calyceal calculi bilaterally, measuring up to 7 mm posteriorly in the mid right kidney on image 35 and up to 11 mm in the lower pole of the left kidney on image 42. There is an obstructing calculus in the mid left ureter, measuring 6 mm on coronal image 66. There is associated left-sided hydronephrosis and delayed contrast excretion. Perinephric soft tissue stranding is slightly worse on the left. Several low-density renal lesions bilaterally are incompletely characterized by this examination, although the larger ones are similar to the prior study, and these are probably all cysts. No suspicious renal mass or bladder abnormality identified.  Stomach/Bowel: No evidence of bowel wall thickening, distention or surrounding inflammatory change. Status post subtotal colectomy with apparent ileorectal anastomosis.  Vascular/Lymphatic: Small retroperitoneal lymph nodes are unchanged. There is atherosclerosis of the aorta, its branches and the iliac arteries. No evidence of large vessel occlusion or significant venous abnormality.  Reproductive: Stable mild enlargement of the prostate gland and mild asymmetry of the  seminal vesicles.  Other: Postsurgical changes in the anterior abdominal wall. No evidence of hernia.  Musculoskeletal: Right total hip arthroplasty. No acute osseous findings. Degenerative changes are present throughout the lumbar spine associate with a mild convex left scoliosis.  IMPRESSION: 1. Obstructing 6 mm calculus in the mid left ureter.  2. Bilateral nephrolithiasis. 3. Additional incidental findings include the presence of cholelithiasis, bilateral renal cystic lesions, postsurgical changes from previous subtotal colectomy and atherosclerosis.   Electronically Signed  By: Richardean Sale M.D.  On: 08/09/2015 14:27  CLINICAL DATA: Subsequent evaluation 6 mm stone mid left ureter  EXAM: ABDOMEN - 1 VIEW  COMPARISON: 08/09/2015 CT scan  FINDINGS: Bowel gas pattern within normal limits. Left ureteral calculus not definitively seen.  IMPRESSION: Left ureteral calculus not confidently identified.   Electronically Signed  By: Skipper Cliche M.D.  On: 08/11/2015 11:53   CLINICAL DATA: Left ureteral stone. History of passage of multiple stones.  EXAM: RENAL / URINARY TRACT ULTRASOUND COMPLETE  COMPARISON: KUB 08/11/2015. CT 08/09/2015. Ultrasound 06/04/2010 .  FINDINGS: Right Kidney:  Length: 12.7 cm. Echogenicity within normal limits. No mass or hydronephrosis visualized. Multiple renal calyceal nonobstructing stones. Largest measures 1.4 cm and is in the upper pole.  Left Kidney:  Length: 12.5 cm. Echogenicity within normal limits. 1.3 cm lower pole simple cyst. No significant mass. Multiple nonobstructing stones, largest in the lower pole measuring 1.4 cm. Previously identified left hydronephrosis has resolved.  Bladder:  Appears normal for degree of bladder distention. Prostate is enlarged.  IMPRESSION: 1. Multiple bilateral renal calyceal nonobstructing stones. Previously identified left hydronephrosis has  resolved. 2. Prostate is enlarged.   Electronically Signed  By: [place]  On: 08/18/2015 11:12  Assessment & Plan:    1. Left ureteral stone:   The stone was not visible on the KUB and there is no hydronephrosis seen on the 08/18/2015 RUS.    2. Hydronephrosis:   RUS on 08/18/2015 demonstrated that the hydronephrosis has resolved.     3. Microscopic hematuria:   Patient continues to have microscopic hematuria with 3-10RBC's/hpf on today's UA.  I will send the urine for culture.  He will be scheduled for a cystoscopy.  I have explained to the patient that a cystoscopy consists of passing a tube with a lens up through their urethra and into their urinary bladder.   We will inject the urethra with a lidocaine gel prior to introducing the cystoscope to help with any discomfort during the procedure.   After the procedure, they might experience blood in the urine and discomfort with urination.  This will abate after the first few voids.  I have  encouraged the patient to increase water intake  during this time.  Patient denies any allergies to lidocaine.   4. Bilateral nephrolithiasis:   Confirmed with KUB and RUS.    We will pursue a CT Urogram in 6 months, if appropriate.    - Urinalysis, Complete - CULTURE, URINE COMPREHENSIVE  Return for cystoscopy.  Zara Council, [facility] [address] [place],  [address] [phone]

## 2015-08-30 LAB — CULTURE, URINE COMPREHENSIVE

## 2015-09-01 ENCOUNTER — Telehealth: Payer: Self-pay | Admitting: Urology

## 2015-09-01 MED ORDER — CIPROFLOXACIN HCL 500 MG PO TABS: 500.0000 mg | ORAL_TABLET | Freq: Two times a day (BID) | ORAL | Status: DC

## 2015-09-01 NOTE — Telephone Encounter (Signed)
+  UCx, please treat with cipro x 7 days.   Hollice Espy, MD

## 2015-09-02 ENCOUNTER — Other Ambulatory Visit: Payer: Self-pay

## 2015-09-02 DIAGNOSIS — N390 Urinary tract infection, site not specified: Secondary | ICD-10-CM

## 2015-09-02 MED ORDER — CIPROFLOXACIN HCL 500 MG PO TABS: 500.0000 mg | ORAL_TABLET | Freq: Two times a day (BID) | ORAL | Status: DC

## 2015-09-02 NOTE — Progress Notes (Signed)
Spoke with pt in reference to infection. Made pt aware abx was called into pt pharmacy. Pt voiced understanding.

## 2015-09-03 NOTE — Telephone Encounter (Signed)
Medication was sent to pt pharmacy and pt made aware.

## 2015-09-08 ENCOUNTER — Ambulatory Visit (INDEPENDENT_AMBULATORY_CARE_PROVIDER_SITE_OTHER): Payer: Medicare Other | Admitting: Urology

## 2015-09-08 ENCOUNTER — Encounter: Payer: Self-pay | Admitting: Urology

## 2015-09-08 VITALS — BP 143/92 | HR 63 | Ht 70.0 in | Wt 183.2 lb

## 2015-09-08 DIAGNOSIS — R3129 Other microscopic hematuria: Secondary | ICD-10-CM

## 2015-09-08 DIAGNOSIS — N200 Calculus of kidney: Secondary | ICD-10-CM | POA: Diagnosis not present

## 2015-09-08 LAB — MICROSCOPIC EXAMINATION
Epithelial Cells (non renal): NONE SEEN /hpf (ref 0–10)
WBC UA: NONE SEEN /HPF (ref 0–?)

## 2015-09-08 LAB — URINALYSIS, COMPLETE
BILIRUBIN UA: NEGATIVE
GLUCOSE, UA: NEGATIVE
KETONES UA: NEGATIVE
Leukocytes, UA: NEGATIVE
NITRITE UA: NEGATIVE
PROTEIN UA: NEGATIVE
Specific Gravity, UA: 1.02 (ref 1.005–1.030)
UUROB: 0.2 mg/dL (ref 0.2–1.0)
pH, UA: 5.5 (ref 5.0–7.5)

## 2015-09-08 NOTE — Progress Notes (Signed)
Patient ID: Stephen Cervantes, male   DOB: 1940-10-31, 75 y.o.   MRN: [id number]    09/08/2015 10:09 AM   Stephen Cervantes 12/23/1940 [id number]  Referring provider: Jodi Marble, MD [address] [place], [place] [address]  Chief Complaint  Patient presents with  . Cysto    HPI:  1 - Recurrent Nephrolithiasis -  Pre 2016 - medicla passage x several, URS x 1 08/2015 - medical passage left 79mm ureteral stone, LLP 1cm non-obstructing as well. No right sided stones. Composition Uric Acid. UpH 5.5--> KCit 20 BID  2 - Gross Hematuria - on / off visible blood in uirne x several, some painless.  Remote 20PY smoker.  Eval 2016 - CT Urogram - nephrolithiaisis as per above, cysto with very large prostate  3- Enlarged Prostate - 80gm prostate with trilobar hypertrophy by cysto 2016, Minimal basleine LUTS.   Today, "Stephen Cervantes" is seen for cysto and f/u above. No interval gross hematuria or colic. He was treated for incidental bacteruria from UA last vist. No h/o febrile or recurrent UTI.   PMH: Past Medical History  Diagnosis Date  . Arthritis   . Thyroid disease   . Kidney stone   . Enlarged prostate   . Bowel obstruction ([place]) [id number]  . Diverticulitis 2006  . Asthma   . Depression   . HLD (hyperlipidemia)   . HTN (hypertension)   . Gallstones 07/04/2014  . Neuritis or radiculitis due to rupture of lumbar intervertebral disc 05/29/2015    Surgical History: Past Surgical History  Procedure Laterality Date  . Ileostomy  2007  . Knee surgery Right 2011  . Joint replacement  2014    Hip Replacement [facility]ie Knows  . Hernia repair  2008?    [place]  . Colectomy  2006    Abdominal colectomy, ileostomy, Hartman procedure for perforation of cecum, sigmoid diverticulitis    Home Medications:    Medication List       This list is accurate as of: 09/08/15 10:09 AM.  Always use your most recent med list.               calcium-vitamin D 250-100  MG-UNIT tablet  Take 1 tablet by mouth 2 (two) times daily.     celecoxib 200 MG capsule  Commonly known as:  CELEBREX  200 mg daily.     cholestyramine 4 g packet  Commonly known as:  QUESTRAN     ciprofloxacin 500 MG tablet  Commonly known as:  CIPRO  Take 1 tablet (500 mg total) by mouth 2 (two) times daily.     Fish Oil 1000 MG Caps  Take 1 capsule by mouth daily.     hydrochlorothiazide 25 MG tablet  Commonly known as:  HYDRODIURIL  Take 25 mg by mouth daily.     levothyroxine 137 MCG tablet  Commonly known as:  SYNTHROID, LEVOTHROID     LOMOTIL 2.5-0.025 MG tablet  Generic drug:  diphenoxylate-atropine  use as directed     loperamide 2 MG capsule  Commonly known as:  IMODIUM  Take by mouth as needed for diarrhea or loose stools.     loratadine 10 MG tablet  Commonly known as:  CLARITIN  Take 10 mg by mouth daily.     Lutein 20 MG Caps  Take 1 capsule by mouth daily.     montelukast 10 MG tablet  Commonly known as:  SINGULAIR  Take 10 mg by mouth at bedtime.  MULTI VITAMIN DAILY PO  Take 1 tablet by mouth daily.     oxyCODONE-acetaminophen 5-325 MG tablet  Commonly known as:  ROXICET  Take 1 tablet by mouth every 6 (six) hours as needed.     potassium citrate 10 MEQ (1080 MG) SR tablet  Commonly known as:  UROCIT-K  Take 10 mEq by mouth 3 (three) times daily with meals.     Vitamin D (Cholecalciferol) 400 units Caps  Take by mouth.        Allergies:  Allergies  Allergen Reactions  . Morphine And Related Other (See Comments)    Severe hallucinations  . Tape Other (See Comments)    Silk Tape per patient "peels off my skin"    Family History: Family History  Problem Relation Age of Onset  . Cancer Father   . Kidney disease Neg Hx   . Prostate cancer Neg Hx   . Cancer Brother     mesophileoma    Social History:  reports that he has quit smoking. He does not have any smokeless tobacco history on file. He reports that he does not drink  alcohol or use illicit drugs.     Review of Systems  Gastrointestinal (upper)  : Negative for upper GI symptoms  Gastrointestinal (lower) : Negative for lower GI symptoms  Constitutional : Negative for symptoms  Skin: Negative for skin symptoms  Eyes: Negative for eye symptoms  Ear/Nose/Throat : Negative for Ear/Nose/Throat symptoms  Hematologic/Lymphatic: Negative for Hematologic/Lymphatic symptoms  Cardiovascular : Negative for cardiovascular symptoms  Respiratory : Negative for respiratory symptoms  Endocrine: Negative for endocrine symptoms  Musculoskeletal: Negative for musculoskeletal symptoms  Neurological: Negative for neurological symptoms  Psychologic: Negative for psychiatric symptoms        Physical Exam: There were no vitals taken for this visit.  Constitutional:  Alert and oriented, No acute distress. HEENT: [place] AT, moist mucus membranes.  Trachea midline, no masses. Cardiovascular: No clubbing, cyanosis, or edema. Respiratory: Normal respiratory effort, no increased work of breathing. GI: Abdomen is soft, nontender, nondistended, no abdominal masses GU: No CVA tenderness. [place] straight,  Skin: No rashes, bruises or suspicious lesions. Lymph: No cervical or inguinal adenopathy. Neurologic: Grossly intact, no focal deficits, moving all 4 extremities. Psychiatric: Normal mood and affect.  Laboratory Data: Lab Results  Component Value Date   WBC 7.9 08/09/2015   HGB 15.5 08/09/2015   HCT 45.4 08/09/2015   MCV 89.8 08/09/2015   PLT 164 08/09/2015    Lab Results  Component Value Date   CREATININE 1.38* 08/09/2015       Cystoscopy Procedure Note  Patient identification was confirmed, informed consent was obtained, and patient was prepped using Betadine solution.  Lidocaine jelly was administered per urethral meatus.    Preoperative abx where received prior to procedure.     Pre-Procedure: - Inspection reveals a normal  caliber ureteral meatus.  Procedure: The flexible cystoscope was introduced without difficulty - No urethral strictures/lesions are present. - Enlarged prostate estimate 80gm with median lobe - Normal bladder neck - Bilateral ureteral orifices identified - Bladder mucosa  reveals no ulcers, tumors, or lesions - No bladder stones - No trabeculation  Retroflexion shows no additonal findings   Post-Procedure: - Patient tolerated the procedure well     Pertinent Imaging: As per HPI  Assessment & Plan:    1 - Recurrent Nephrolithiasis - only current stone burden low risk in lower pole. Rec yearly KUB surveillance as well as vigorous hydration /  judicious protein intake in addition to K-Cit.   2 - Gross Hematuria - likley from stones as per above. Would consider repeat eval for unexplained gross episodes only.   3- Enlarged Prostate - low threshold for future finasteride if bothersome LUTS or recurrent gross hematuria.   4- RTC 1 year with KUB, BMP    No Follow-up on file.  Alexis Frock, [facility] [address] [place], [place] [address] [phone]

## 2016-04-22 ENCOUNTER — Ambulatory Visit (INDEPENDENT_AMBULATORY_CARE_PROVIDER_SITE_OTHER): Payer: Medicare Other | Admitting: Sports Medicine

## 2016-04-22 ENCOUNTER — Ambulatory Visit (INDEPENDENT_AMBULATORY_CARE_PROVIDER_SITE_OTHER): Payer: Medicare Other

## 2016-04-22 ENCOUNTER — Other Ambulatory Visit: Payer: Self-pay | Admitting: Sports Medicine

## 2016-04-22 ENCOUNTER — Encounter: Payer: Self-pay | Admitting: Sports Medicine

## 2016-04-22 ENCOUNTER — Encounter: Payer: Self-pay | Admitting: Urology

## 2016-04-22 DIAGNOSIS — M79672 Pain in left foot: Secondary | ICD-10-CM

## 2016-04-22 DIAGNOSIS — M205X2 Other deformities of toe(s) (acquired), left foot: Secondary | ICD-10-CM | POA: Diagnosis not present

## 2016-04-22 DIAGNOSIS — M79671 Pain in right foot: Secondary | ICD-10-CM

## 2016-04-22 NOTE — Patient Instructions (Signed)
Hallux Rigidus Hallux rigidus is a condition involving pain and a loss of motion of the first (big) toe. The pain gets worse with lifting up (extension) of the toe. This is usually due to arthritic bony bumps (spurring) of the joint at the base of the big toe.  SYMPTOMS   Pain, with lifting up of the toe.  Tenderness over the joint where the big toe meets the foot.  Redness, swelling, and warmth over the top of the base of the big toe (sometimes).  Foot pain, stiffness, and limping. CAUSES  Hallux rigidus is caused by arthritis of the joint where the big toe meets the foot. The arthritis creates a bone spur that pinches the soft tissues when the toe is extended. RISK INCREASES WITH:  Tight shoes with a narrow toe box.  Family history of foot problems.  Gout and rheumatoid and psoriatic arthritis.  History of previous toe injury, including "turf toe."  Long first toe, flat feet, and other big toe bony bumps.  Arthritis of the big toe. PREVENTION   Wear wide-toed shoes that fit well.  Tape the big toe to reduce motion and to prevent pinching of the tissues between the bone.  Maintain physical fitness:  Foot and ankle flexibility.  Muscle strength and endurance. PROGNOSIS  This condition can usually be managed with proper treatment. However, surgery is typically required to prevent the problem from recurring.  RELATED COMPLICATIONS  Injury to other areas of the foot or ankle, caused by abnormal walking in an attempt to avoid the pain felt when walking normally. TREATMENT Treatment first involves stopping the activities that aggravate your symptoms. Ice and medicine can be used to reduce the pain and inflammation. Modifications to shoes may help reduce pain, including wearing stiff-soled shoes, shoes with a wide toe box, inserting a padded donut to relieve pressure on top of the joint, or wearing an arch support. Corticosteroid injections may be given to reduce inflammation. If  nonsurgical treatment is unsuccessful, surgery may be needed. Surgical options include removing the arthritic bony spur, cutting a bone in the foot to change the arc of motion (allowing the toe to extend more), or fusion of the joint (eliminating all motion in the joint at the base of the big toe).  MEDICATION   If pain medicine is needed, nonsteroidal anti-inflammatory medicines (aspirin and ibuprofen), or other minor pain relievers (acetaminophen), are often advised.  Do not take pain medicine for 7 days before surgery.  Prescription pain relievers are usually prescribed only after surgery. Use only as directed and only as much as you need.  Ointments for arthritis, applied to the skin, may give some relief.  Injections of corticosteroids may be given to reduce inflammation. HEAT AND COLD  Cold treatment (icing) relieves pain and reduces inflammation. Cold treatment should be applied for 10 to 15 minutes every 2 to 3 hours, and immediately after activity that aggravates your symptoms. Use ice packs or an ice massage.  Heat treatment may be used before performing the stretching and strengthening activities prescribed by your caregiver, physical therapist, or athletic trainer. Use a heat pack or a warm water soak. SEEK MEDICAL CARE IF:   Symptoms get worse or do not improve in 2 weeks, despite treatment.  After surgery you develop fever, increasing pain, redness, swelling, drainage of fluids, bleeding, or increasing warmth.  New, unexplained symptoms develop. (Drugs used in treatment may produce side effects.)   This information is not intended to replace advice given to   you by your health care provider. Make sure you discuss any questions you have with your health care provider.   Document Released: 08/22/2005 Document Revised: 09/12/2014 Document Reviewed: 12/04/2008 Elsevier Interactive Patient Education 2016 [employer] Inc[employer]  

## 2016-04-22 NOTE — Progress Notes (Signed)
Subjective: Stephen Cervantes is a 75 y.o. male patient who presents to office for evaluation of Left big toe joint pain.  Patient complains of progressive pain especially over the last few months at the left big toe and has noticed changes with his gait. Reports pain is minimal with stiffness to joint.  Patient has not tried any treatment and denies any trauma or injury. Patient denies any other pedal complaints.   There are no active problems to display for this patient.   No current outpatient prescriptions on file prior to visit.   No current facility-administered medications on file prior to visit.     Not on File  Objective:  General: Alert and oriented x3 in no acute distress  Dermatology: No open lesions bilateral lower extremities, no webspace macerations, no ecchymosis bilateral, all nails x 10 are well manicured.  Vascular: Dorsalis Pedis and Posterior Tibial pedal pulses 2/4, Capillary Fill Time 3 seconds, (+) pedal hair growth bilateral, no edema bilateral lower extremities, Temperature gradient within normal limits.  Neurology: Gross sensation intact via light touch bilateral. (-) Tinels sign.   Musculoskeletal: Minimal tenderness with palpation left dorsal bunion deformity at dorsal lateral 1st MTPJ with limitation but no crepitus with range of motion. Strength within normal limits.   Xrays  Left Foot    Impression: Normal osseous mineralization, 1st MTPJ joint space narrowing, dorsal flag sign and squaring of 1st met head suggestive of hallux limitus/rigidus. No acute fracture or dislocation. No other acute findings.       Assessment and Plan: Problem List Items Addressed This Visit    None    Visit Diagnoses    Left foot pain    -  Primary   Hallux limitus of left foot         -Complete examination performed -Xrays reviewed -Discussed treatement options; discussed Hallux limitus deformity;conservative and  Surgical management; risks, benefits, alternatives  discussed. All patient's questions answered. -Patient decline injection and states that pain is bearable and wants to continue to monitor area -Applied felt mortons extension to inner shoe liner. If this works well patient may benefit from custom orthotics. -Recommend continue with good supportive shoes and OTC inserts meanwhile.  -Recommend epsom salt soaks and icing as instructed  -Patient to return to office as needed or sooner if condition worsens.  Landis Martins, DPM

## 2016-06-02 ENCOUNTER — Other Ambulatory Visit: Payer: Self-pay | Admitting: Neurology

## 2016-06-02 DIAGNOSIS — G451 Carotid artery syndrome (hemispheric): Secondary | ICD-10-CM

## 2016-06-14 ENCOUNTER — Ambulatory Visit
Admission: RE | Admit: 2016-06-14 | Discharge: 2016-06-14 | Disposition: A | Discharge status: Home / Self Care | Payer: Medicare Other | Source: Ambulatory Visit | Attending: Neurology | Admitting: Neurology

## 2016-06-14 DIAGNOSIS — G451 Carotid artery syndrome (hemispheric): Secondary | ICD-10-CM | POA: Diagnosis present

## 2016-09-21 ENCOUNTER — Ambulatory Visit: Payer: Medicare Other

## 2017-01-31 DIAGNOSIS — G459 Transient cerebral ischemic attack, unspecified: Secondary | ICD-10-CM | POA: Insufficient documentation

## 2017-03-06 ENCOUNTER — Telehealth: Payer: Self-pay | Admitting: Urology

## 2017-03-06 DIAGNOSIS — N200 Calculus of kidney: Secondary | ICD-10-CM

## 2017-03-06 NOTE — Telephone Encounter (Signed)
Pt called office to reschedule his missed appt due to Ice.  I noticed that he is supposed to have KUB prior to appt and I explained that he needed to get this done prior to his appt, however, he has no orders in the system for his KUB. Pt is planning to go the day before his scheduled appt on 7/18. Please advise or add orders. Thanks.

## 2017-03-06 NOTE — Telephone Encounter (Signed)
Placed orders for KUB and BMP. Called pt to inform. No answer. Will try later.

## 2017-03-10 NOTE — Telephone Encounter (Signed)
Patient notified and will come for BMP and KUB before appointment.

## 2017-03-20 ENCOUNTER — Other Ambulatory Visit: Payer: Self-pay | Admitting: *Deleted

## 2017-03-20 ENCOUNTER — Ambulatory Visit
Admission: RE | Admit: 2017-03-20 | Discharge: 2017-03-20 | Disposition: A | Discharge status: Home / Self Care | Payer: Medicare Other | Source: Ambulatory Visit | Attending: Urology | Admitting: Urology

## 2017-03-20 ENCOUNTER — Other Ambulatory Visit: Payer: Medicare Other

## 2017-03-20 DIAGNOSIS — N200 Calculus of kidney: Secondary | ICD-10-CM

## 2017-03-20 DIAGNOSIS — N281 Cyst of kidney, acquired: Secondary | ICD-10-CM | POA: Insufficient documentation

## 2017-03-20 DIAGNOSIS — M419 Scoliosis, unspecified: Secondary | ICD-10-CM | POA: Diagnosis not present

## 2017-03-20 NOTE — Progress Notes (Unsigned)
ERROR

## 2017-03-21 ENCOUNTER — Ambulatory Visit
Admission: RE | Admit: 2017-03-21 | Discharge: 2017-03-21 | Disposition: A | Discharge status: Home / Self Care | Payer: Medicare Other | Source: Ambulatory Visit | Attending: Urology | Admitting: Urology

## 2017-03-21 DIAGNOSIS — N200 Calculus of kidney: Secondary | ICD-10-CM

## 2017-03-21 LAB — BASIC METABOLIC PANEL
BUN/Creatinine Ratio: 16 (ref 10–24)
BUN: 18 mg/dL (ref 8–27)
CALCIUM: 9.7 mg/dL (ref 8.6–10.2)
CO2: 22 mmol/L (ref 20–29)
CREATININE: 1.16 mg/dL (ref 0.76–1.27)
Chloride: 101 mmol/L (ref 96–106)
GFR, EST AFRICAN AMERICAN: 71 mL/min/{1.73_m2} (ref >59)
GFR, EST NON AFRICAN AMERICAN: 61 mL/min/{1.73_m2} (ref >59)
Glucose: 98 mg/dL (ref 65–99)
POTASSIUM: 4.3 mmol/L (ref 3.5–5.2)
Sodium: 143 mmol/L (ref 134–144)

## 2017-03-21 NOTE — Progress Notes (Signed)
03/22/2017 10:12 AM   [name] Dec 24, 1940 [id number]  Referring provider: Jodi Marble, MD [place], [place] [address]  Chief Complaint  Patient presents with  . Nephrolithiasis    1 year follow up     HPI: 76 yo WM with a history of hematuria, nephrolithiasis and BPH with LU TS who presents today for a one year follow up.    History of hematuria Remote 20PY smoker.   Eval 2016 - CT Urogram - nephrolithiasis, cysto with very large prostate no episodes of gross hematuria  Nephrolithiasis Pre 2016 - medicla passage x several, URS x 1 08/2015 - medical passage left 66mm ureteral stone, LLP 1cm non-obstructing as well. No right sided stones. Composition Uric Acid. UpH 5.5--> KCit 20 BID  KUB noted increased in size of bilateral stones Cr 1.6  BPH WITH LUTS  (prostate and/or bladder) His IPSS score today is 2, which is mild lower urinary tract symptomatology. He is pleased with his quality life due to his urinary symptoms.     His major complaints today are mild intermittency and a weak stream.  He has had these symptoms for several years.  He denies any dysuria, hematuria or suprapubic pain.  He also denies any recent fevers, chills, nausea or vomiting.        IPSS    Row Name 03/22/17 0900         International Prostate Symptom Score   How often have you had the sensation of not emptying your bladder? Not at All     How often have you had to urinate less than every two hours? Not at All     How often have you found you stopped and started again several times when you urinated? Less than 1 in 5 times     How often have you found it difficult to postpone urination? Not at All     How often have you had a weak urinary stream? Less than 1 in 5 times     How often have you had to strain to start urination? Not at All     How many times did you typically get up at night to urinate? None     Total IPSS Score 2       Quality of Life due to  urinary symptoms   If you were to spend the rest of your life with your urinary condition just the way it is now how would you feel about that? Pleased        Score:  1-7 Mild 8-19 Moderate 20-35 Severe   PMH: Past Medical History:  Diagnosis Date  . Arthritis   . Asthma   . Bowel obstruction ([place]) [id number]  . Depression   . Diverticulitis 2006  . Enlarged prostate   . Gallstones 07/04/2014  . HLD (hyperlipidemia)   . HTN (hypertension)   . Kidney stone   . Neuritis or radiculitis due to rupture of lumbar intervertebral disc 05/29/2015  . Thyroid disease     Surgical History: Past Surgical History:  Procedure Laterality Date  . COLECTOMY  2006   Abdominal colectomy, ileostomy, Hartman procedure for perforation of cecum, sigmoid diverticulitis  . HERNIA REPAIR  2008?   [place]  . ILEOSTOMY  2007  . JOINT REPLACEMENT  2014   Hip Replacement [facility]  . KNEE SURGERY Right 2011    Home Medications:  Allergies as of 03/22/2017  Reactions   Morphine And Related Other (See Comments)   Severe hallucinations   Tape Other (See Comments)   Silk Tape per patient "peels off my skin"      Medication List       Accurate as of 03/22/17 10:12 AM. Always use your most recent med list.          aspirin 81 MG chewable tablet Chew by mouth.   atorvastatin 10 MG tablet Commonly known as:  LIPITOR Take by mouth.   CALCIUM PO Take by mouth.   calcium-vitamin D 250-100 MG-UNIT tablet Take 1 tablet by mouth 2 (two) times daily.   CALCIUM/VITAMIN D3 GUMMIES 250-350 MG-UNIT Chew Generic drug:  Ca Phosphate-Cholecalciferol Chew by mouth.   celecoxib 200 MG capsule Commonly known as:  CELEBREX 200 mg daily.   cholestyramine 4 g packet Commonly known as:  QUESTRAN   ciprofloxacin 500 MG tablet Commonly known as:  CIPRO Take 1 tablet (500 mg total) by mouth 2 (two) times daily.   Fish Oil 1000 MG Caps Take 1 capsule by mouth  daily.   FISH OIL PO Take by mouth.   hydrochlorothiazide 25 MG tablet Commonly known as:  HYDRODIURIL Take 25 mg by mouth daily.   levothyroxine 137 MCG tablet Commonly known as:  SYNTHROID, LEVOTHROID 112 mcg.   LOMOTIL 2.5-0.025 MG tablet Generic drug:  diphenoxylate-atropine use as directed   loperamide 2 MG capsule Commonly known as:  IMODIUM Take by mouth as needed for diarrhea or loose stools.   loratadine 10 MG dissolvable tablet Commonly known as:  CLARITIN REDITABS Take 10 mg by mouth.   Lutein 20 MG Caps Take 1 capsule by mouth daily.   montelukast 10 MG tablet Commonly known as:  SINGULAIR Take 10 mg by mouth at bedtime.   MULTI VITAMIN DAILY PO Take 1 tablet by mouth daily.   MULTI-VITAMINS Tabs Administer 1 tablet by mouth once a day   MULTIVITAMIN PO Take by mouth.   oxyCODONE-acetaminophen 5-325 MG tablet Commonly known as:  ROXICET Take 1 tablet by mouth every 6 (six) hours as needed.   potassium citrate 10 MEQ (1080 MG) SR tablet Commonly known as:  UROCIT-K Take 10 mEq by mouth 3 (three) times daily with meals.   Vitamin D (Cholecalciferol) 400 units Caps Take by mouth.       Allergies:  Allergies  Allergen Reactions  . Morphine And Related Other (See Comments)    Severe hallucinations  . Tape Other (See Comments)    Silk Tape per patient "peels off my skin"    Family History: Family History  Problem Relation Age of Onset  . Cancer Father   . Cancer Brother        mesophileoma  . Kidney disease Neg Hx   . Prostate cancer Neg Hx   . Kidney cancer Neg Hx   . Bladder Cancer Neg Hx     Social History:  reports that he quit smoking about 20 years ago. His smoking use included Cigarettes. He has never used smokeless tobacco. He reports that he does not drink alcohol or use drugs.  ROS: UROLOGY Frequent Urination?: No Hard to postpone urination?: No Burning/pain with urination?: No Get up at night to urinate?: No Leakage  of urine?: No Urine stream starts and stops?: No Trouble starting stream?: No Do you have to strain to urinate?: No Blood in urine?: No Urinary tract infection?: No Sexually transmitted disease?: No Injury to kidneys or bladder?: No Painful intercourse?:  No Weak stream?: No Erection problems?: No Penile pain?: No  Gastrointestinal Nausea?: No Vomiting?: No Indigestion/heartburn?: No Diarrhea?: No Constipation?: No  Constitutional Fever: No Night sweats?: No Weight loss?: No Fatigue?: No  Skin Skin rash/lesions?: No Itching?: No  Eyes Blurred vision?: No Double vision?: No  Ears/Nose/Throat Sore throat?: No Sinus problems?: No  Hematologic/Lymphatic Swollen glands?: No Easy bruising?: No  Cardiovascular Leg swelling?: No Chest pain?: No  Respiratory Cough?: No Shortness of breath?: No  Endocrine Excessive thirst?: No  Musculoskeletal Back pain?: No Joint pain?: No  Neurological Headaches?: No Dizziness?: No  Psychologic Depression?: No Anxiety?: No  Physical Exam: BP 129/82   Pulse 69   Ht 5\' 10"  (1.778 m)   Wt 186 lb 6.4 oz (84.6 kg)   BMI 26.75 kg/m   Constitutional: Well nourished. Alert and oriented, No acute distress. HEENT:  AT, moist mucus membranes. Trachea midline, no masses. Cardiovascular: No clubbing, cyanosis, or edema. Respiratory: Normal respiratory effort, no increased work of breathing. GI: Abdomen is soft, non tender, non distended, no abdominal masses. Liver and spleen not palpable.  No hernias appreciated.  Stool sample for occult testing is not indicated.   GU: No CVA tenderness.  No bladder fullness or masses.  Patient with circumcised phallus. Urethral meatus is patent.  No penile discharge. No penile lesions or rashes. Scrotum without lesions, cysts, rashes and/or edema.  Testicles are located scrotally bilaterally. No masses are appreciated in the testicles. Left and right epididymis are normal. Rectal: Patient  with  normal sphincter tone. Anus and perineum without scarring or rashes. No rectal masses are appreciated. Prostate is approximately 60 grams, no nodules are appreciated. Seminal vesicles are normal. Skin: No rashes, bruises or suspicious lesions. Lymph: No cervical or inguinal adenopathy. Neurologic: Grossly intact, no focal deficits, moving all 4 extremities. Psychiatric: Normal mood and affect.  Laboratory data Results for orders placed or performed in visit on 74/12/87  Basic metabolic panel  Result Value Ref Range   Glucose 98 65 - 99 mg/dL   BUN 18 8 - 27 mg/dL   Creatinine, Ser 1.16 0.76 - 1.27 mg/dL   GFR calc non Af Amer 61 >59 mL/min/1.73   GFR calc Af Amer 71 >59 mL/min/1.73   BUN/Creatinine Ratio 16 10 - 24   Sodium 143 134 - 144 mmol/L   Potassium 4.3 3.5 - 5.2 mmol/L   Chloride 101 96 - 106 mmol/L   CO2 22 20 - 29 mmol/L   Calcium 9.7 8.6 - 10.2 mg/dL   I have reviewed the labs  Pertinent Imaging: CLINICAL DATA:  Kidney stones.  EXAM: ABDOMEN - 1 VIEW  COMPARISON:  CT of the abdomen and pelvis 08/09/2015. Abdominal x-ray 08/11/2015.  FINDINGS: Bilateral renal calculi have increased in size. The largest right-sided stone is 7.5 mm. The largest left-sided stone is 13 mm. No calcifications are evident along the expected course of the ureters bilaterally. Focal densities in the right lower quadrant are likely within the ascending colon. The bowel gas pattern is otherwise normal. Levoconvex scoliosis is present in the lower lumbar spine. Right hip prosthesis is again noted.  IMPRESSION: 1. Increasing size of bilateral renal calculi. 2. No definite ureteral calculi. 3. Scoliosis.   Electronically Signed   By: San Morelle M.D.   On: 03/20/2017 11:08  CLINICAL DATA:  Nephrolithiasis  EXAM: RENAL / URINARY TRACT ULTRASOUND COMPLETE  COMPARISON:  August 18, 2015 renal ultrasound; abdominal radiograph March 20, 2017  FINDINGS: Right  Kidney:  Length: 12.5 cm. Echogenicity and renal cortical thickness are within normal limits. No perinephric fluid, mass or hydronephrosis visualized. There is a calculus measuring 9 mm in the upper pole of the right kidney. No ureterectasis.  Left Kidney:  Length: 12.0 cm. Echogenicity and renal cortical thickness are within normal limits. No perinephric fluid or hydronephrosis visualized. There is a cyst arising in the upper pole left kidney region measuring 1.4 x 1.2 x 1.2 cm. There are several echogenic foci in the left kidney. There is a the 1.8 cm calculus in the lower pole left kidney. No evident ureterectasis.  Bladder:  Appears normal for degree of bladder distention.  Prostate measures 4.6 x 3.8 x 3.3 cm. The prostate impinges upon the inferior aspect of the urinary bladder.  IMPRESSION: Nonobstructing intrarenal calculi, larger on the left than on the right. No hydronephrosis. Small left renal cyst. Renal cortical thickness and echogenicity normal bilaterally.  Prostate appears prominent with impression on the inferior urinary bladder.   Electronically Signed   By: Lowella Grip III M.D.   On: 03/21/2017 13:27  I have independently reviewed the films.    Assessment & Plan:   1. History of hematuria  - hematuria work up completed 2017 - no malignancy is found  - No reports of gross hematuria  - RTC  in one year for symptom recheck  2. Bilateral nephrolithiasis  - increasing in size  - discussed treatment options with the patient and he would like to defer any treatment at this time  - KUB in one year  3. BPH with LUTS  - IPSS score is 2/1  - Continue conservative management, avoiding bladder irritants and timed voiding's  - most bothersome symptoms is/are intermittency and a weak stream  - RTC in 12 months for IPSS and exam      Return in about 1 year (around 03/22/2018) for KUB, IPSS and exam.  These notes generated with voice  recognition software. I apologize for typographical errors.  Zara Council, [facility] [address], [place], [place] [address] [phone]

## 2017-03-22 ENCOUNTER — Ambulatory Visit (INDEPENDENT_AMBULATORY_CARE_PROVIDER_SITE_OTHER): Payer: Medicare Other | Admitting: Urology

## 2017-03-22 ENCOUNTER — Encounter: Payer: Self-pay | Admitting: Urology

## 2017-03-22 VITALS — BP 129/82 | HR 69 | Ht 70.0 in | Wt 186.4 lb

## 2017-03-22 DIAGNOSIS — N401 Enlarged prostate with lower urinary tract symptoms: Secondary | ICD-10-CM | POA: Diagnosis not present

## 2017-03-22 DIAGNOSIS — N138 Other obstructive and reflux uropathy: Secondary | ICD-10-CM | POA: Diagnosis not present

## 2017-03-22 DIAGNOSIS — N200 Calculus of kidney: Secondary | ICD-10-CM | POA: Diagnosis not present

## 2017-03-22 DIAGNOSIS — Z87448 Personal history of other diseases of urinary system: Secondary | ICD-10-CM

## 2017-03-24 ENCOUNTER — Inpatient Hospital Stay: Payer: Medicare Other

## 2017-03-24 ENCOUNTER — Ambulatory Visit
Admission: RE | Admit: 2017-03-24 | Discharge: 2017-03-24 | Disposition: A | Discharge status: Home / Self Care | Payer: Medicare Other | Source: Ambulatory Visit | Attending: Oncology | Admitting: Oncology

## 2017-03-24 ENCOUNTER — Inpatient Hospital Stay: Payer: Medicare Other | Attending: Oncology | Admitting: Oncology

## 2017-03-24 ENCOUNTER — Encounter: Payer: Self-pay | Admitting: Oncology

## 2017-03-24 VITALS — BP 130/90 | HR 78 | Temp 98.5°F | Resp 18 | Ht 70.0 in | Wt 186.4 lb

## 2017-03-24 DIAGNOSIS — E079 Disorder of thyroid, unspecified: Secondary | ICD-10-CM

## 2017-03-24 DIAGNOSIS — N400 Enlarged prostate without lower urinary tract symptoms: Secondary | ICD-10-CM | POA: Diagnosis not present

## 2017-03-24 DIAGNOSIS — D696 Thrombocytopenia, unspecified: Secondary | ICD-10-CM

## 2017-03-24 DIAGNOSIS — I10 Essential (primary) hypertension: Secondary | ICD-10-CM

## 2017-03-24 DIAGNOSIS — Z87891 Personal history of nicotine dependence: Secondary | ICD-10-CM | POA: Diagnosis not present

## 2017-03-24 DIAGNOSIS — E785 Hyperlipidemia, unspecified: Secondary | ICD-10-CM

## 2017-03-24 DIAGNOSIS — Z808 Family history of malignant neoplasm of other organs or systems: Secondary | ICD-10-CM

## 2017-03-24 DIAGNOSIS — Z87442 Personal history of urinary calculi: Secondary | ICD-10-CM

## 2017-03-24 DIAGNOSIS — Z7982 Long term (current) use of aspirin: Secondary | ICD-10-CM

## 2017-03-24 DIAGNOSIS — M5116 Intervertebral disc disorders with radiculopathy, lumbar region: Secondary | ICD-10-CM | POA: Diagnosis not present

## 2017-03-24 DIAGNOSIS — K5790 Diverticulosis of intestine, part unspecified, without perforation or abscess without bleeding: Secondary | ICD-10-CM | POA: Diagnosis not present

## 2017-03-24 DIAGNOSIS — Z9049 Acquired absence of other specified parts of digestive tract: Secondary | ICD-10-CM

## 2017-03-24 DIAGNOSIS — R918 Other nonspecific abnormal finding of lung field: Secondary | ICD-10-CM | POA: Insufficient documentation

## 2017-03-24 DIAGNOSIS — Z79899 Other long term (current) drug therapy: Secondary | ICD-10-CM | POA: Diagnosis not present

## 2017-03-24 DIAGNOSIS — D751 Secondary polycythemia: Secondary | ICD-10-CM

## 2017-03-24 LAB — CBC WITH DIFFERENTIAL/PLATELET
BASOS ABS: 0.1 10*3/uL (ref 0–0.1)
BASOS PCT: 1 %
Eosinophils Absolute: 0.2 10*3/uL (ref 0–0.7)
Eosinophils Relative: 4 %
HEMATOCRIT: 50.1 % (ref 40.0–52.0)
HEMOGLOBIN: 17.6 g/dL (ref 13.0–18.0)
Lymphocytes Relative: 23 %
Lymphs Abs: 1.1 10*3/uL (ref 1.0–3.6)
MCH: 31.2 pg (ref 26.0–34.0)
MCHC: 35.1 g/dL (ref 32.0–36.0)
MCV: 89 fL (ref 80.0–100.0)
Monocytes Absolute: 0.6 10*3/uL (ref 0.2–1.0)
Monocytes Relative: 12 %
NEUTROS ABS: 3 10*3/uL (ref 1.4–6.5)
NEUTROS PCT: 60 %
Platelets: 186 10*3/uL (ref 150–440)
RBC: 5.63 MIL/uL (ref 4.40–5.90)
RDW: 13.4 % (ref 11.5–14.5)
WBC: 4.9 10*3/uL (ref 3.8–10.6)

## 2017-03-24 LAB — URINALYSIS, COMPLETE (UACMP) WITH MICROSCOPIC
BILIRUBIN URINE: NEGATIVE
Bacteria, UA: NONE SEEN
GLUCOSE, UA: NEGATIVE mg/dL
KETONES UR: NEGATIVE mg/dL
LEUKOCYTES UA: NEGATIVE
Nitrite: NEGATIVE
PH: 6 (ref 5.0–8.0)
PROTEIN: NEGATIVE mg/dL
Specific Gravity, Urine: 1.018 (ref 1.005–1.030)
Squamous Epithelial / LPF: NONE SEEN

## 2017-03-24 LAB — FOLATE: Folate: 33 ng/mL (ref >5.9)

## 2017-03-24 LAB — COMPREHENSIVE METABOLIC PANEL
ALBUMIN: 4.2 g/dL (ref 3.5–5.0)
ALK PHOS: 75 U/L (ref 38–126)
ALT: 47 U/L (ref 17–63)
ANION GAP: 5 (ref 5–15)
AST: 42 U/L — ABNORMAL HIGH (ref 15–41)
BUN: 21 mg/dL — ABNORMAL HIGH (ref 6–20)
CALCIUM: 10.3 mg/dL (ref 8.9–10.3)
CHLORIDE: 106 mmol/L (ref 101–111)
CO2: 28 mmol/L (ref 22–32)
Creatinine, Ser: 1.21 mg/dL (ref 0.61–1.24)
GFR calc Af Amer: >60 mL/min (ref >60)
GFR calc non Af Amer: 57 mL/min — ABNORMAL LOW (ref >60)
GLUCOSE: 100 mg/dL — AB (ref 65–99)
Potassium: 4 mmol/L (ref 3.5–5.1)
Sodium: 139 mmol/L (ref 135–145)
Total Bilirubin: 0.8 mg/dL (ref 0.3–1.2)
Total Protein: 7 g/dL (ref 6.5–8.1)

## 2017-03-24 LAB — PATHOLOGIST SMEAR REVIEW

## 2017-03-24 LAB — VITAMIN B12: Vitamin B-12: 797 pg/mL (ref 180–914)

## 2017-03-24 NOTE — Progress Notes (Signed)
No sx of low plt.

## 2017-03-24 NOTE — Progress Notes (Signed)
Hematology/Oncology Consult note [facility] Telephone:([phone] [phone]  Patient Care Team: Jodi Marble, MD as PCP - General (Internal Medicine) Jodi Marble, MD as Referring Physician (Internal Medicine) Bary Castilla Forest Gleason, MD (General Surgery) Jodi Marble, MD (Internal Medicine)   Name of the patient: Stephen Cervantes  [id number]  1940/11/28    Reason for referral- thrombocytopenia   Referring physician- Dr. Elijio Miles  Date of visit: 03/24/17   History of presenting illness- Patient is a 76 year old male was then referred to [place] for thrombocytopenia. The second blood work from 07/18/2016 showed normal LFTs. TSH was normal at 2.02. CBC from 01/28/2017 showed white count of 4., H&H of 16.3/48.4 with an MCV of 91 and a platelet count of 127.  CBC from  04/24/2017 showed white count of 4.6 and a platelet count of 132 with an H&H of 16.9 in 48.3. Between 2013 and  2016 his platelet count was normal. BMP done 4 days ago was within normal limits  Patient has had a prior history of colectomy for diverticulosis and perforation. Currently patient denies any fatigue or unintentional weight loss. Denies any bleeding or bruising.  ECOG PS- 0  Pain scale- 0   Review of systems- Review of Systems  Constitutional: Negative for chills, fever, malaise/fatigue and weight loss.  HENT: Negative for congestion, ear discharge and nosebleeds.   Eyes: Negative for blurred vision.  Respiratory: Negative for cough, hemoptysis, sputum production, shortness of breath and wheezing.   Cardiovascular: Negative for chest pain, palpitations, orthopnea and claudication.  Gastrointestinal: Negative for abdominal pain, blood in stool, constipation, diarrhea, heartburn, melena, nausea and vomiting.  Genitourinary: Negative for dysuria, flank pain, frequency, hematuria and urgency.  Musculoskeletal: Negative for back pain, joint pain and myalgias.  Skin:  Negative for rash.  Neurological: Negative for dizziness, tingling, focal weakness, seizures, weakness and headaches.  Endo/Heme/Allergies: Does not bruise/bleed easily.  Psychiatric/Behavioral: Negative for depression and suicidal ideas. The patient does not have insomnia.     Allergies  Allergen Reactions  . Morphine And Related Other (See Comments)    Severe hallucinations  . Tape Other (See Comments)    Silk Tape per patient "peels off my skin"    Patient Active Problem List   Diagnosis Date Noted  . Kidney stones 08/26/2015  . Left ureteral stone 08/16/2015  . Hydronephrosis with urinary obstruction due to ureteral calculus 08/16/2015  . Microscopic hematuria 08/16/2015  . Neuritis or radiculitis due to rupture of lumbar intervertebral disc 05/29/2015  . Left flank pain 07/04/2014  . Gallstones 07/04/2014  . Benign prostatic hyperplasia with urinary obstruction 01/09/2013  . Calculi, ureter 09/13/2012  . Calculus of kidney 09/13/2012  . Frank hematuria 09/13/2012  . 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 Renal colic 48/09/6551     Past Medical History:  Diagnosis Date  . Arthritis   . Asthma   . Bowel obstruction ([place]) 7482,7078  . Depression   . Diverticulitis 2006  . Enlarged prostate   . Gallstones 07/04/2014  . HLD (hyperlipidemia)   . HTN (hypertension)   . Kidney stone    kidney stones  . Neuritis or radiculitis due to rupture of lumbar intervertebral disc 05/29/2015  . Thyroid disease      Past Surgical History:  Procedure Laterality Date  . COLECTOMY  2006   Abdominal colectomy, ileostomy, Hartman procedure for perforation of cecum, sigmoid diverticulitis  . HERNIA REPAIR  2008?   [facility]  . ILEOSTOMY  2007  . JOINT REPLACEMENT  2014   Hip Replacement [facility]  . KNEE SURGERY Right 2011    Social History   Social History  . Marital status: Divorced    Spouse name: N/A  . Number of  children: N/A  . Years of education: N/A   Occupational History  . Not on file.   Social History Main Topics  . Smoking status: Former Smoker    Types: Cigarettes    Quit date: 1998  . Smokeless tobacco: Never Used  . Alcohol use No  . Drug use: No  . Sexual activity: Not on file   Other Topics Concern  . Not on file   Social History Narrative   ** Merged History Encounter **         Family History  Problem Relation Age of Onset  . Cancer Father   . Cancer Brother        mesophileoma  . Kidney disease Neg Hx   . Prostate cancer Neg Hx   . Kidney cancer Neg Hx   . Bladder Cancer Neg Hx      Current Outpatient Prescriptions:  .  aspirin 81 MG chewable tablet, Chew by mouth., Disp: , Rfl:  .  atorvastatin (LIPITOR) 10 MG tablet, Take by mouth., Disp: , Rfl:  .  calcium-vitamin D (OSCAL WITH D) 500-200 MG-UNIT tablet, Take 1 tablet by mouth daily with breakfast., Disp: , Rfl:  .  calcium-vitamin D 250-100 MG-UNIT per tablet, Take 1 tablet by mouth 2 (two) times daily., Disp: , Rfl:  .  celecoxib (CELEBREX) 200 MG capsule, 200 mg daily. , Disp: , Rfl:  .  diphenoxylate-atropine (LOMOTIL) 2.5-0.025 MG tablet, use as directed, Disp: , Rfl:  .  hydrochlorothiazide (HYDRODIURIL) 25 MG tablet, Take 25 mg by mouth daily. , Disp: , Rfl:  .  levothyroxine (SYNTHROID, LEVOTHROID) 137 MCG tablet, 112 mcg. , Disp: , Rfl: 1 .  loperamide (IMODIUM) 2 MG capsule, Take by mouth as needed for diarrhea or loose stools., Disp: , Rfl:  .  loratadine (CLARITIN REDITABS) 10 MG dissolvable tablet, Take 10 mg by mouth., Disp: , Rfl:  .  Lutein 20 MG CAPS, Take 1 capsule by mouth daily., Disp: , Rfl:  .  montelukast (SINGULAIR) 10 MG tablet, Take 10 mg by mouth at bedtime. , Disp: , Rfl:  .  Multiple Vitamin (MULTI VITAMIN DAILY PO), Take 1 tablet by mouth daily., Disp: , Rfl:  .  Multiple Vitamin (MULTI-VITAMINS) TABS, Administer 1 tablet by mouth once a day, Disp: , Rfl:  .  Multiple  Vitamins-Minerals (MULTIVITAMIN PO), Take by mouth., Disp: , Rfl:  .  Omega-3 Fatty Acids (FISH OIL PO), Take 1,200 tablets by mouth. Daily, Disp: , Rfl:  .  potassium citrate (UROCIT-K) 10 MEQ (1080 MG) SR tablet, Take 10 mEq by mouth 3 (three) times daily with meals. , Disp: , Rfl:  .  vitamin B-12 (CYANOCOBALAMIN) 1000 MCG tablet, Take 1,000 mcg by mouth daily., Disp: , Rfl:  .  Vitamin D, Cholecalciferol, 400 UNITS CAPS, Take by mouth., Disp: , Rfl:    Physical exam:  Vitals:   03/24/17 1432  BP: 130/90  Pulse: 78  Resp: 18  Temp: 98.5 F (36.9 C)  TempSrc: Tympanic  Weight: 186 lb 6.4 oz (84.6 kg)  Height: 5\' 10"  (1.778 m)   Physical Exam  Constitutional: He is oriented to person, place, and time and well-developed, well-nourished, and in  no distress.  HENT:  Head: Normocephalic and atraumatic.  Eyes: Pupils are equal, round, and reactive to light. EOM are normal.  Neck: Normal range of motion.  Cardiovascular: Normal rate, regular rhythm and normal heart sounds.   Pulmonary/Chest: Effort normal and breath sounds normal.  Abdominal: Soft. Bowel sounds are normal.  No splenomegaly  Lymphadenopathy:  No palpable adenopathy  Neurological: He is alert and oriented to person, place, and time.  Skin: Skin is warm and dry.       CMP Latest Ref Rng & Units 03/20/2017  Glucose 65 - 99 mg/dL 98  BUN 8 - 27 mg/dL 18  Creatinine 0.76 - 1.27 mg/dL 1.16  Sodium 134 - 144 mmol/L 143  Potassium 3.5 - 5.2 mmol/L 4.3  Chloride 96 - 106 mmol/L 101  CO2 20 - 29 mmol/L 22  Calcium 8.6 - 10.2 mg/dL 9.7  Total Protein 6.5 - 8.1 g/dL -  Total Bilirubin 0.3 - 1.2 mg/dL -  Alkaline Phos 38 - 126 U/L -  AST 15 - 41 U/L -  ALT 17 - 63 U/L -   CBC Latest Ref Rng & Units 08/09/2015  WBC 3.8 - 10.6 K/uL 7.9  Hemoglobin 13.0 - 18.0 g/dL 15.5  Hematocrit 40.0 - 52.0 % 45.4  Platelets 150 - 440 K/uL 164    No images are attached to the encounter.  Dg Abd 1 View  Result Date:  03/20/2017 CLINICAL DATA:  Kidney stones. EXAM: ABDOMEN - 1 VIEW COMPARISON:  CT of the abdomen and pelvis 08/09/2015. Abdominal x-ray 08/11/2015. FINDINGS: Bilateral renal calculi have increased in size. The largest right-sided stone is 7.5 mm. The largest left-sided stone is 13 mm. No calcifications are evident along the expected course of the ureters bilaterally. Focal densities in the right lower quadrant are likely within the ascending colon. The bowel gas pattern is otherwise normal. Levoconvex scoliosis is present in the lower lumbar spine. Right hip prosthesis is again noted. IMPRESSION: 1. Increasing size of bilateral renal calculi. 2. No definite ureteral calculi. 3. Scoliosis. Electronically Signed   By: San Morelle M.D.   On: 03/20/2017 11:08   US Renal  Result Date: 03/21/2017 CLINICAL DATA:  Nephrolithiasis EXAM: RENAL / URINARY TRACT ULTRASOUND COMPLETE COMPARISON:  August 18, 2015 renal ultrasound; abdominal radiograph March 20, 2017 FINDINGS: Right Kidney: Length: 12.5 cm. Echogenicity and renal cortical thickness are within normal limits. No perinephric fluid, mass or hydronephrosis visualized. There is a calculus measuring 9 mm in the upper pole of the right kidney. No ureterectasis. Left Kidney: Length: 12.0 cm. Echogenicity and renal cortical thickness are within normal limits. No perinephric fluid or hydronephrosis visualized. There is a cyst arising in the upper pole left kidney region measuring 1.4 x 1.2 x 1.2 cm. There are several echogenic foci in the left kidney. There is a the 1.8 cm calculus in the lower pole left kidney. No evident ureterectasis. Bladder: Appears normal for degree of bladder distention. Prostate measures 4.6 x 3.8 x 3.3 cm. The prostate impinges upon the inferior aspect of the urinary bladder. IMPRESSION: Nonobstructing intrarenal calculi, larger on the left than on the right. No hydronephrosis. Small left renal cyst. Renal cortical thickness and  echogenicity normal bilaterally. Prostate appears prominent with impression on the inferior urinary bladder. Electronically Signed   By: Lowella Grip III M.D.   On: 03/21/2017 13:27    Assessment and plan- Patient is a 76 y.o. male referred to [place] for mild thrombocytopenia  On review of  prior CBC, patient has mild polycythemia since his hb is >16.5 and mild thrombocytopenia. I will therefore check cbc with diff, pathology review of smear, b12, folate, HIV and Hep C testiing. Check JAK2, UA, CXR and EPO level. I will see him back in 2 weeks time to discuss his results   Thank you for this kind referral and the opportunity to participate in the care of this patient   Visit Diagnosis 1. Polycythemia   2. Thrombocytopenia ([place])     Dr. Randa Evens, MD, MPH [facility] Pager- [phone] 03/24/2017

## 2017-03-31 LAB — ERYTHROPOIETIN: ERYTHROPOIETIN: 10.6 m[IU]/mL (ref 2.6–18.5)

## 2017-03-31 LAB — HIV ANTIBODY (ROUTINE TESTING W REFLEX): HIV SCREEN 4TH GENERATION: NONREACTIVE

## 2017-03-31 LAB — HEPATITIS C ANTIBODY

## 2017-04-03 LAB — JAK2 GENOTYPR

## 2017-04-07 ENCOUNTER — Inpatient Hospital Stay: Payer: Medicare Other | Attending: Oncology | Admitting: Oncology

## 2017-04-07 VITALS — BP 130/84 | HR 68 | Temp 97.4°F | Resp 18 | Wt 186.5 lb

## 2017-04-07 DIAGNOSIS — E785 Hyperlipidemia, unspecified: Secondary | ICD-10-CM | POA: Diagnosis not present

## 2017-04-07 DIAGNOSIS — M1990 Unspecified osteoarthritis, unspecified site: Secondary | ICD-10-CM

## 2017-04-07 DIAGNOSIS — N400 Enlarged prostate without lower urinary tract symptoms: Secondary | ICD-10-CM | POA: Insufficient documentation

## 2017-04-07 DIAGNOSIS — Z9049 Acquired absence of other specified parts of digestive tract: Secondary | ICD-10-CM | POA: Diagnosis not present

## 2017-04-07 DIAGNOSIS — Z79899 Other long term (current) drug therapy: Secondary | ICD-10-CM | POA: Diagnosis not present

## 2017-04-07 DIAGNOSIS — I10 Essential (primary) hypertension: Secondary | ICD-10-CM | POA: Diagnosis not present

## 2017-04-07 DIAGNOSIS — E079 Disorder of thyroid, unspecified: Secondary | ICD-10-CM | POA: Insufficient documentation

## 2017-04-07 DIAGNOSIS — Z807 Family history of other malignant neoplasms of lymphoid, hematopoietic and related tissues: Secondary | ICD-10-CM | POA: Insufficient documentation

## 2017-04-07 DIAGNOSIS — N200 Calculus of kidney: Secondary | ICD-10-CM | POA: Diagnosis not present

## 2017-04-07 DIAGNOSIS — Z87891 Personal history of nicotine dependence: Secondary | ICD-10-CM | POA: Insufficient documentation

## 2017-04-07 DIAGNOSIS — R3129 Other microscopic hematuria: Secondary | ICD-10-CM | POA: Diagnosis not present

## 2017-04-07 DIAGNOSIS — K5790 Diverticulosis of intestine, part unspecified, without perforation or abscess without bleeding: Secondary | ICD-10-CM | POA: Diagnosis not present

## 2017-04-07 DIAGNOSIS — Z87442 Personal history of urinary calculi: Secondary | ICD-10-CM | POA: Insufficient documentation

## 2017-04-07 DIAGNOSIS — D751 Secondary polycythemia: Secondary | ICD-10-CM | POA: Insufficient documentation

## 2017-04-07 DIAGNOSIS — Z7982 Long term (current) use of aspirin: Secondary | ICD-10-CM | POA: Diagnosis not present

## 2017-04-07 DIAGNOSIS — D696 Thrombocytopenia, unspecified: Secondary | ICD-10-CM | POA: Insufficient documentation

## 2017-04-07 NOTE — Progress Notes (Signed)
Patient states he is here today for results.  Offers no complaints.

## 2017-04-07 NOTE — Progress Notes (Signed)
Hematology/Oncology Consult note [facility]  Telephone:([phone] [phone]  Patient Care Team: Jodi Marble, MD as PCP - General (Internal Medicine) Jodi Marble, MD as Referring Physician (Internal Medicine) Bary Castilla Forest Gleason, MD (General Surgery) Jodi Marble, MD (Internal Medicine)   Name of the patient: Stephen Cervantes  [id number]  [birth year]   Date of visit: 04/07/17  Diagnosis- 1. Thrombocytopenia resolved  2. Secondary polycythemia  Chief complaint/ Reason for visit- discuss results of bloodwork  Heme/Onc history: Patient is a 76 year old male was then referred to [place] for thrombocytopenia. The second blood work from 07/18/2016 showed normal LFTs. TSH was normal at 2.02. CBC from 01/28/2017 showed white count of 4., H&H of 16.3/48.4 with an MCV of 91 and a platelet count of 127.  CBC from  04/24/2017 showed white count of 4.6 and a platelet count of 132 with an H&H of 16.9 in 48.3. Between 2013 and  2016 his platelet count was normal. BMP done 4 days ago was within normal limits  Patient has had a prior history of colectomy for diverticulosis and perforation. Currently patient denies any fatigue or unintentional weight loss. Denies any bleeding or bruising.  Results of bloodwork from 03/24/2017 were as follows: CBC showed white count of 4.9, H&H of 17.6/50.1 and a platelet count of 186. EPO levels were normal and jak 2 mutation testing was negative. HIV and hep C antibody testing was negative. B12 and folate were within normal limits. CMP was unremarkable except for mildly elevated AST of 42. Urinalysis showed 6-30 RBCs.  Interval history- doing well. Denies any complaints  ECOG PS- 0 Pain scale- 0   Review of systems- Review of Systems  Constitutional: Negative for chills, fever, malaise/fatigue and weight loss.  HENT: Negative for congestion, ear discharge and nosebleeds.   Eyes: Negative for blurred vision.    Respiratory: Negative for cough, hemoptysis, sputum production, shortness of breath and wheezing.   Cardiovascular: Negative for chest pain, palpitations, orthopnea and claudication.  Gastrointestinal: Negative for abdominal pain, blood in stool, constipation, diarrhea, heartburn, melena, nausea and vomiting.  Genitourinary: Negative for dysuria, flank pain, frequency, hematuria and urgency.  Musculoskeletal: Negative for back pain, joint pain and myalgias.  Skin: Negative for rash.  Neurological: Negative for dizziness, tingling, focal weakness, seizures, weakness and headaches.  Endo/Heme/Allergies: Does not bruise/bleed easily.  Psychiatric/Behavioral: Negative for depression and suicidal ideas. The patient does not have insomnia.       Allergies  Allergen Reactions  . Morphine And Related Other (See Comments)    Severe hallucinations  . Tape Other (See Comments)    Silk Tape per patient "peels off my skin"     Past Medical History:  Diagnosis Date  . Arthritis   . Asthma   . Bowel obstruction ([place]) 3976,7341  . Depression   . Diverticulitis 2006  . Enlarged prostate   . Gallstones 07/04/2014  . HLD (hyperlipidemia)   . HTN (hypertension)   . Kidney stone    kidney stones  . Neuritis or radiculitis due to rupture of lumbar intervertebral disc 05/29/2015  . Thyroid disease      Past Surgical History:  Procedure Laterality Date  . COLECTOMY  2006   Abdominal colectomy, ileostomy, Hartman procedure for perforation of cecum, sigmoid diverticulitis  . HERNIA REPAIR  2008?   [place]  . ILEOSTOMY  2007  . JOINT REPLACEMENT  2014   Hip Replacement [facility]  . KNEE  SURGERY Right 2011    Social History   Social History  . Marital status: Divorced    Spouse name: N/A  . Number of children: N/A  . Years of education: N/A   Occupational History  . Not on file.   Social History Main Topics  . Smoking status: Former Smoker    Types:  Cigarettes    Quit date: 1998  . Smokeless tobacco: Never Used  . Alcohol use No  . Drug use: No  . Sexual activity: Not on file   Other Topics Concern  . Not on file   Social History Narrative   ** Merged History Encounter **        Family History  Problem Relation Age of Onset  . Cancer Father        cancer of lung, brain  and lymphoma  . Parkinson's disease Brother   . Mesothelioma Brother   . Kidney disease Neg Hx   . Prostate cancer Neg Hx   . Kidney cancer Neg Hx   . Bladder Cancer Neg Hx      Current Outpatient Prescriptions:  .  aspirin 81 MG chewable tablet, Chew by mouth., Disp: , Rfl:  .  atorvastatin (LIPITOR) 10 MG tablet, Take by mouth., Disp: , Rfl:  .  calcium-vitamin D (OSCAL WITH D) 500-200 MG-UNIT tablet, Take 1 tablet by mouth daily with breakfast., Disp: , Rfl:  .  calcium-vitamin D 250-100 MG-UNIT per tablet, Take 1 tablet by mouth 2 (two) times daily., Disp: , Rfl:  .  celecoxib (CELEBREX) 200 MG capsule, 200 mg daily. , Disp: , Rfl:  .  diphenoxylate-atropine (LOMOTIL) 2.5-0.025 MG tablet, use as directed, Disp: , Rfl:  .  hydrochlorothiazide (HYDRODIURIL) 25 MG tablet, Take 25 mg by mouth daily. , Disp: , Rfl:  .  levothyroxine (SYNTHROID, LEVOTHROID) 137 MCG tablet, 112 mcg. , Disp: , Rfl: 1 .  loperamide (IMODIUM) 2 MG capsule, Take by mouth as needed for diarrhea or loose stools., Disp: , Rfl:  .  loratadine (CLARITIN REDITABS) 10 MG dissolvable tablet, Take 10 mg by mouth., Disp: , Rfl:  .  Lutein 20 MG CAPS, Take 1 capsule by mouth daily., Disp: , Rfl:  .  montelukast (SINGULAIR) 10 MG tablet, Take 10 mg by mouth at bedtime. , Disp: , Rfl:  .  Multiple Vitamin (MULTI VITAMIN DAILY PO), Take 1 tablet by mouth daily., Disp: , Rfl:  .  Multiple Vitamin (MULTI-VITAMINS) TABS, Administer 1 tablet by mouth once a day, Disp: , Rfl:  .  Omega-3 Fatty Acids (FISH OIL PO), Take 1,200 tablets by mouth. Daily, Disp: , Rfl:  .  potassium citrate (UROCIT-K)  10 MEQ (1080 MG) SR tablet, Take 10 mEq by mouth 3 (three) times daily with meals. , Disp: , Rfl:  .  vitamin B-12 (CYANOCOBALAMIN) 1000 MCG tablet, Take 1,000 mcg by mouth daily., Disp: , Rfl:  .  Vitamin D, Cholecalciferol, 400 UNITS CAPS, Take by mouth., Disp: , Rfl:   Physical exam:  Vitals:   04/07/17 1336  BP: 130/84  Pulse: 68  Resp: 18  Temp: (!) 97.4 F (36.3 C)  TempSrc: Tympanic  Weight: 186 lb 8 oz (84.6 kg)   Physical Exam  Constitutional: He is oriented to person, place, and time and well-developed, well-nourished, and in no distress.  HENT:  Head: Normocephalic and atraumatic.  Eyes: Pupils are equal, round, and reactive to light. EOM are normal.  Neck: Normal range of motion.  Cardiovascular:  Normal rate, regular rhythm and normal heart sounds.   Pulmonary/Chest: Effort normal and breath sounds normal.  Abdominal: Soft. Bowel sounds are normal.  Neurological: He is alert and oriented to person, place, and time.  Skin: Skin is warm and dry.     CMP Latest Ref Rng & Units 03/24/2017  Glucose 65 - 99 mg/dL 100(H)  BUN 6 - 20 mg/dL 21(H)  Creatinine 0.61 - 1.24 mg/dL 1.21  Sodium 135 - 145 mmol/L 139  Potassium 3.5 - 5.1 mmol/L 4.0  Chloride 101 - 111 mmol/L 106  CO2 22 - 32 mmol/L 28  Calcium 8.9 - 10.3 mg/dL 10.3  Total Protein 6.5 - 8.1 g/dL 7.0  Total Bilirubin 0.3 - 1.2 mg/dL 0.8  Alkaline Phos 38 - 126 U/L 75  AST 15 - 41 U/L 42(H)  ALT 17 - 63 U/L 47   CBC Latest Ref Rng & Units 03/24/2017  WBC 3.8 - 10.6 K/uL 4.9  Hemoglobin 13.0 - 18.0 g/dL 17.6  Hematocrit 40.0 - 52.0 % 50.1  Platelets 150 - 440 K/uL 186    No images are attached to the encounter.  Dg Chest 2 View  Result Date: 03/24/2017 CLINICAL DATA:  Polycythemia. EXAM: CHEST  2 VIEW COMPARISON:  CT abdomen 08/09/2015 . FINDINGS: Mediastinum and hilar structures are normal. Bibasilar interstitial prominence noted. Similar bibasilar interstitial prominence noted on prior CT of 08/09/2015.  Interstitial changes may be chronic. Minimal active infiltrate in the left lung base cannot be completely excluded. Small left pleural effusion cannot be excluded. No pneumothorax. Heart size normal. Degenerative changes thoracic spine . IMPRESSION: 1. Mild bilateral interstitial prominence, most likely chronic. 2. Minimal left base infiltrate cannot be completely excluded. Tiny left pleural effusion cannot be excluded. Electronically Signed   By: Marcello Moores  Register   On: 03/24/2017 16:00   Dg Abd 1 View  Result Date: 03/20/2017 CLINICAL DATA:  Kidney stones. EXAM: ABDOMEN - 1 VIEW COMPARISON:  CT of the abdomen and pelvis 08/09/2015. Abdominal x-ray 08/11/2015. FINDINGS: Bilateral renal calculi have increased in size. The largest right-sided stone is 7.5 mm. The largest left-sided stone is 13 mm. No calcifications are evident along the expected course of the ureters bilaterally. Focal densities in the right lower quadrant are likely within the ascending colon. The bowel gas pattern is otherwise normal. Levoconvex scoliosis is present in the lower lumbar spine. Right hip prosthesis is again noted. IMPRESSION: 1. Increasing size of bilateral renal calculi. 2. No definite ureteral calculi. 3. Scoliosis. Electronically Signed   By: San Morelle M.D.   On: 03/20/2017 11:08   US Renal  Result Date: 03/21/2017 CLINICAL DATA:  Nephrolithiasis EXAM: RENAL / URINARY TRACT ULTRASOUND COMPLETE COMPARISON:  August 18, 2015 renal ultrasound; abdominal radiograph March 20, 2017 FINDINGS: Right Kidney: Length: 12.5 cm. Echogenicity and renal cortical thickness are within normal limits. No perinephric fluid, mass or hydronephrosis visualized. There is a calculus measuring 9 mm in the upper pole of the right kidney. No ureterectasis. Left Kidney: Length: 12.0 cm. Echogenicity and renal cortical thickness are within normal limits. No perinephric fluid or hydronephrosis visualized. There is a cyst arising in the upper  pole left kidney region measuring 1.4 x 1.2 x 1.2 cm. There are several echogenic foci in the left kidney. There is a the 1.8 cm calculus in the lower pole left kidney. No evident ureterectasis. Bladder: Appears normal for degree of bladder distention. Prostate measures 4.6 x 3.8 x 3.3 cm. The prostate impinges upon the inferior  aspect of the urinary bladder. IMPRESSION: Nonobstructing intrarenal calculi, larger on the left than on the right. No hydronephrosis. Small left renal cyst. Renal cortical thickness and echogenicity normal bilaterally. Prostate appears prominent with impression on the inferior urinary bladder. Electronically Signed   By: Lowella Grip III M.D.   On: 03/21/2017 13:27     Assessment and plan- Patient is a 76 y.o. male who sees me for following issues:  1. Thrombocytopenia- B12, folate, HIV and hep C testing negative. Repeat CBC showed a normal platelet count. It had normalized on repeat testing  2. Secondary polycythemia of unclear etiology. Jak 2 testing was negative and EPO levels are normal and therefore he does not have primary polycythemia. He did have microscopic hematuria and was found to have 6-30 RBCs in his urine. However, patient known to have h/o hematuria and sees urology for the same. He has had work up for hematuria which showed no evidence of malignancy    Visit Diagnosis 1. Thrombocytopenia ([place])   2. Polycythemia, secondary   3. Microscopic hematuria      Dr. Randa Evens, MD, MPH [facility] Pager- [phone] 04/07/2017

## 2017-04-10 ENCOUNTER — Encounter: Payer: Self-pay | Admitting: Oncology

## 2017-05-22 DIAGNOSIS — Z9049 Acquired absence of other specified parts of digestive tract: Secondary | ICD-10-CM | POA: Insufficient documentation

## 2017-05-22 DIAGNOSIS — R1013 Epigastric pain: Secondary | ICD-10-CM | POA: Insufficient documentation

## 2017-05-22 DIAGNOSIS — Z8719 Personal history of other diseases of the digestive system: Secondary | ICD-10-CM | POA: Insufficient documentation

## 2017-05-23 ENCOUNTER — Other Ambulatory Visit: Payer: Self-pay | Admitting: Student

## 2017-05-23 DIAGNOSIS — M545 Low back pain: Secondary | ICD-10-CM

## 2017-05-29 ENCOUNTER — Ambulatory Visit
Admission: RE | Admit: 2017-05-29 | Discharge: 2017-05-29 | Disposition: A | Discharge status: Home / Self Care | Payer: Medicare Other | Source: Ambulatory Visit | Attending: Student | Admitting: Student

## 2017-05-29 DIAGNOSIS — M48061 Spinal stenosis, lumbar region without neurogenic claudication: Secondary | ICD-10-CM | POA: Diagnosis not present

## 2017-05-29 DIAGNOSIS — M545 Low back pain: Secondary | ICD-10-CM

## 2017-05-29 DIAGNOSIS — G8929 Other chronic pain: Secondary | ICD-10-CM | POA: Insufficient documentation

## 2017-05-29 DIAGNOSIS — M5124 Other intervertebral disc displacement, thoracic region: Secondary | ICD-10-CM | POA: Diagnosis not present

## 2017-05-29 DIAGNOSIS — M2578 Osteophyte, vertebrae: Secondary | ICD-10-CM | POA: Diagnosis not present

## 2017-08-11 ENCOUNTER — Inpatient Hospital Stay: Payer: Medicare Other | Admitting: Oncology

## 2017-08-11 ENCOUNTER — Inpatient Hospital Stay: Payer: Medicare Other

## 2017-08-11 ENCOUNTER — Telehealth: Payer: Self-pay | Admitting: Oncology

## 2017-08-11 ENCOUNTER — Other Ambulatory Visit: Payer: Self-pay

## 2017-08-11 NOTE — Telephone Encounter (Signed)
08/11/17 Lab and MD appt rschd per Judeen Hammans to 08/24/17 @ 10:45 a.m., per patient not having labs done. Patient currently had Lab and MD appt schd for today at 1:30 pm and MD appt at 2 pm for today, but did not go to labs. Patient given copy of new appt schd.

## 2017-08-11 NOTE — Progress Notes (Signed)
Here for follow up. Stated that he did not know he was suppose to have labs today.

## 2017-08-11 NOTE — Progress Notes (Signed)
Hematology/Oncology Consult note [facility]  Telephone:([phone] Fax:([phone]  Patient Care Team: Jodi Marble, MD as PCP - General (Internal Medicine) Jodi Marble, MD as Referring Physician (Internal Medicine) Bary Castilla Forest Gleason, MD (General Surgery) Jodi Marble, MD (Internal Medicine)   Name of the patient: Stephen Cervantes  [id number]  Oct 21, 1940   Date of visit: 08/11/17  Diagnosis- 1. Thrombocytopenia resolved  2. Secondary polycythemia  Chief complaint/ Reason for visit- routine f/u of thrombocytopenia and polycythemia  Heme/Onc history: Patient is a 76 year old male was then referred to [place] for thrombocytopenia. The second blood work from 07/18/2016 showed normal LFTs. TSH was normal at 2.02. CBC from 01/28/2017 showed white count of 4., H&H of 16.3/48.4 with an MCV of 91 and a platelet count of 127. CBC from 04/24/2017 showed white count of 4.6 and a platelet count of 132 with an H&H of 16.9 in 48.3. Between 2013 and 2016 his platelet count was normal. BMP done 4 days ago was within normal limits  Patient has had a prior history of colectomy for diverticulosis and perforation. Currently patient denies any fatigue or unintentional weight loss. Denies any bleeding or bruising.  Results of bloodwork from 03/24/2017 were as follows: CBC showed white count of 4.9, H&H of 17.6/50.1 and a platelet count of 186. EPO levels were normal and jak 2 mutation testing was negative. HIV and hep C antibody testing was negative. B12 and folate were within normal limits. CMP was unremarkable except for mildly elevated AST of 42. Urinalysis showed 6-30 RBCs.  Patient follows up with urology for h/o hematuria     Interval history-   ECOG PS-  Pain scale-  Opioid associated constipation-  Review of systems- Review of Systems  Constitutional: Negative for chills, fever, malaise/fatigue and weight loss.  HENT: Negative for  congestion, ear discharge and nosebleeds.   Eyes: Negative for blurred vision.  Respiratory: Negative for cough, hemoptysis, sputum production, shortness of breath and wheezing.   Cardiovascular: Negative for chest pain, palpitations, orthopnea and claudication.  Gastrointestinal: Negative for abdominal pain, blood in stool, constipation, diarrhea, heartburn, melena, nausea and vomiting.  Genitourinary: Negative for dysuria, flank pain, frequency, hematuria and urgency.  Musculoskeletal: Negative for back pain, joint pain and myalgias.  Skin: Negative for rash.  Neurological: Negative for dizziness, tingling, focal weakness, seizures, weakness and headaches.  Endo/Heme/Allergies: Does not bruise/bleed easily.  Psychiatric/Behavioral: Negative for depression and suicidal ideas. The patient does not have insomnia.       Allergies  Allergen Reactions  . Morphine And Related Other (See Comments)    Severe hallucinations  . Tape Other (See Comments)    Silk Tape per patient "peels off my skin"     Past Medical History:  Diagnosis Date  . Arthritis   . Asthma   . Bowel obstruction ([place]) 5035,4656  . Depression   . Diverticulitis 2006  . Enlarged prostate   . Gallstones 07/04/2014  . HLD (hyperlipidemia)   . HTN (hypertension)   . Kidney stone    kidney stones  . Neuritis or radiculitis due to rupture of lumbar intervertebral disc 05/29/2015  . Thyroid disease      Past Surgical History:  Procedure Laterality Date  . COLECTOMY  2006   Abdominal colectomy, ileostomy, Hartman procedure for perforation of cecum, sigmoid diverticulitis  . HERNIA REPAIR  2008?   [place]  . ILEOSTOMY  2007  . JOINT REPLACEMENT  2014  Hip Replacement Hessie Knows  . KNEE SURGERY Right 2011    Social History   Socioeconomic History  . Marital status: Divorced    Spouse name: Not on file  . Number of children: Not on file  . Years of education: Not on file  .  Highest education level: Not on file  Social Needs  . Financial resource strain: Not on file  . Food insecurity - worry: Not on file  . Food insecurity - inability: Not on file  . Transportation needs - medical: Not on file  . Transportation needs - non-medical: Not on file  Occupational History  . Not on file  Tobacco Use  . Smoking status: Former Smoker    Types: Cigarettes    Last attempt to quit: 1998    Years since quitting: 20.9  . Smokeless tobacco: Never Used  Substance and Sexual Activity  . Alcohol use: No    Alcohol/week: 0.0 oz  . Drug use: No  . Sexual activity: Not on file  Other Topics Concern  . Not on file  Social History Narrative   ** Merged History Encounter **        Family History  Problem Relation Age of Onset  . Cancer Father        cancer of lung, brain  and lymphoma  . Parkinson's disease Brother   . Mesothelioma Brother   . Kidney disease Neg Hx   . Prostate cancer Neg Hx   . Kidney cancer Neg Hx   . Bladder Cancer Neg Hx      Current Outpatient Medications:  .  aspirin 81 MG chewable tablet, Chew by mouth., Disp: , Rfl:  .  atorvastatin (LIPITOR) 10 MG tablet, Take by mouth., Disp: , Rfl:  .  calcium-vitamin D (OSCAL WITH D) 500-200 MG-UNIT tablet, Take 1 tablet by mouth daily with breakfast., Disp: , Rfl:  .  calcium-vitamin D 250-100 MG-UNIT per tablet, Take 1 tablet by mouth 2 (two) times daily., Disp: , Rfl:  .  celecoxib (CELEBREX) 200 MG capsule, 200 mg daily. , Disp: , Rfl:  .  diphenoxylate-atropine (LOMOTIL) 2.5-0.025 MG tablet, use as directed, Disp: , Rfl:  .  hydrochlorothiazide (HYDRODIURIL) 25 MG tablet, Take 25 mg by mouth daily. , Disp: , Rfl:  .  levothyroxine (SYNTHROID, LEVOTHROID) 112 MCG tablet, Take 112 mcg by mouth daily., Disp: , Rfl: 1 .  loperamide (IMODIUM) 2 MG capsule, Take by mouth as needed for diarrhea or loose stools., Disp: , Rfl:  .  loratadine (CLARITIN REDITABS) 10 MG dissolvable tablet, Take 10 mg by  mouth., Disp: , Rfl:  .  Lutein 20 MG CAPS, Take 1 capsule by mouth daily., Disp: , Rfl:  .  montelukast (SINGULAIR) 10 MG tablet, Take 10 mg by mouth at bedtime. , Disp: , Rfl:  .  Multiple Vitamin (MULTI VITAMIN DAILY PO), Take 1 tablet by mouth daily., Disp: , Rfl:  .  Multiple Vitamin (MULTI-VITAMINS) TABS, Administer 1 tablet by mouth once a day, Disp: , Rfl:  .  Omega-3 Fatty Acids (FISH OIL PO), Take 1,200 tablets by mouth. Daily, Disp: , Rfl:  .  potassium citrate (UROCIT-K) 10 MEQ (1080 MG) SR tablet, Take 10 mEq by mouth 3 (three) times daily with meals. , Disp: , Rfl:  .  vitamin B-12 (CYANOCOBALAMIN) 1000 MCG tablet, Take 1,000 mcg by mouth daily., Disp: , Rfl:  .  Vitamin D, Cholecalciferol, 400 UNITS CAPS, Take by mouth., Disp: , Rfl:  Physical exam: There were no vitals filed for this visit. Physical Exam  Constitutional: He is oriented to person, place, and time and well-developed, well-nourished, and in no distress.  HENT:  Head: Normocephalic and atraumatic.  Eyes: EOM are normal. Pupils are equal, round, and reactive to light.  Neck: Normal range of motion.  Cardiovascular: Normal rate, regular rhythm and normal heart sounds.  Pulmonary/Chest: Effort normal and breath sounds normal.  Abdominal: Soft. Bowel sounds are normal.  Neurological: He is alert and oriented to person, place, and time.  Skin: Skin is warm and dry.     CMP Latest Ref Rng & Units 03/24/2017  Glucose 65 - 99 mg/dL 100(H)  BUN 6 - 20 mg/dL 21(H)  Creatinine 0.61 - 1.24 mg/dL 1.21  Sodium 135 - 145 mmol/L 139  Potassium 3.5 - 5.1 mmol/L 4.0  Chloride 101 - 111 mmol/L 106  CO2 22 - 32 mmol/L 28  Calcium 8.9 - 10.3 mg/dL 10.3  Total Protein 6.5 - 8.1 g/dL 7.0  Total Bilirubin 0.3 - 1.2 mg/dL 0.8  Alkaline Phos 38 - 126 U/L 75  AST 15 - 41 U/L 42(H)  ALT 17 - 63 U/L 47   CBC Latest Ref Rng & Units 03/24/2017  WBC 3.8 - 10.6 K/uL 4.9  Hemoglobin 13.0 - 18.0 g/dL 17.6  Hematocrit 40.0 - 52.0 %  50.1  Platelets 150 - 440 K/uL 186    Assessment and plan- Patient is a 76 y.o. male who sees me for following issues:  1. Thrombocytopenia-   2. Secondary polycythemia of unclear etiology. Jak 2 testing was negative and EPO levels are normal and therefore he does not have primary polycythemia.     Visit Diagnosis No diagnosis found.   Dr. Randa Evens, MD, MPH [facility] Pager- [phone] 08/11/2017 1:41 PM

## 2017-08-24 ENCOUNTER — Encounter: Payer: Self-pay | Admitting: Oncology

## 2017-08-24 ENCOUNTER — Inpatient Hospital Stay (HOSPITAL_BASED_OUTPATIENT_CLINIC_OR_DEPARTMENT_OTHER): Payer: Medicare Other | Admitting: Oncology

## 2017-08-24 ENCOUNTER — Inpatient Hospital Stay: Payer: Medicare Other | Attending: Oncology

## 2017-08-24 VITALS — BP 147/96 | HR 71 | Temp 98.1°F | Resp 16 | Wt 188.0 lb

## 2017-08-24 DIAGNOSIS — Z79899 Other long term (current) drug therapy: Secondary | ICD-10-CM | POA: Diagnosis not present

## 2017-08-24 DIAGNOSIS — Z7982 Long term (current) use of aspirin: Secondary | ICD-10-CM | POA: Diagnosis not present

## 2017-08-24 DIAGNOSIS — Z801 Family history of malignant neoplasm of trachea, bronchus and lung: Secondary | ICD-10-CM

## 2017-08-24 DIAGNOSIS — E785 Hyperlipidemia, unspecified: Secondary | ICD-10-CM

## 2017-08-24 DIAGNOSIS — Z87891 Personal history of nicotine dependence: Secondary | ICD-10-CM | POA: Insufficient documentation

## 2017-08-24 DIAGNOSIS — I10 Essential (primary) hypertension: Secondary | ICD-10-CM

## 2017-08-24 DIAGNOSIS — M1990 Unspecified osteoarthritis, unspecified site: Secondary | ICD-10-CM | POA: Diagnosis not present

## 2017-08-24 DIAGNOSIS — N400 Enlarged prostate without lower urinary tract symptoms: Secondary | ICD-10-CM | POA: Diagnosis not present

## 2017-08-24 DIAGNOSIS — D751 Secondary polycythemia: Secondary | ICD-10-CM

## 2017-08-24 DIAGNOSIS — D419 Neoplasm of uncertain behavior of unspecified urinary organ: Secondary | ICD-10-CM

## 2017-08-24 DIAGNOSIS — K5790 Diverticulosis of intestine, part unspecified, without perforation or abscess without bleeding: Secondary | ICD-10-CM | POA: Insufficient documentation

## 2017-08-24 DIAGNOSIS — Z9049 Acquired absence of other specified parts of digestive tract: Secondary | ICD-10-CM | POA: Insufficient documentation

## 2017-08-24 DIAGNOSIS — E079 Disorder of thyroid, unspecified: Secondary | ICD-10-CM | POA: Insufficient documentation

## 2017-08-24 DIAGNOSIS — Z807 Family history of other malignant neoplasms of lymphoid, hematopoietic and related tissues: Secondary | ICD-10-CM

## 2017-08-24 DIAGNOSIS — Z87442 Personal history of urinary calculi: Secondary | ICD-10-CM | POA: Insufficient documentation

## 2017-08-24 LAB — CBC WITH DIFFERENTIAL/PLATELET
BASOS ABS: 0 10*3/uL (ref 0–0.1)
Basophils Relative: 1 %
Eosinophils Absolute: 0.2 10*3/uL (ref 0–0.7)
Eosinophils Relative: 4 %
HEMATOCRIT: 49.2 % (ref 40.0–52.0)
Hemoglobin: 17 g/dL (ref 13.0–18.0)
LYMPHS ABS: 0.9 10*3/uL — AB (ref 1.0–3.6)
LYMPHS PCT: 17 %
MCH: 31.2 pg (ref 26.0–34.0)
MCHC: 34.6 g/dL (ref 32.0–36.0)
MCV: 90.3 fL (ref 80.0–100.0)
MONO ABS: 0.5 10*3/uL (ref 0.2–1.0)
Monocytes Relative: 9 %
NEUTROS ABS: 3.8 10*3/uL (ref 1.4–6.5)
Neutrophils Relative %: 69 %
Platelets: 170 10*3/uL (ref 150–440)
RBC: 5.45 MIL/uL (ref 4.40–5.90)
RDW: 13.6 % (ref 11.5–14.5)
WBC: 5.5 10*3/uL (ref 3.8–10.6)

## 2017-08-24 NOTE — Progress Notes (Signed)
Hematology/Oncology Consult note [facility]  Telephone:([phone] [phone]  Patient Care Team: Jodi Marble, MD as PCP - General (Internal Medicine) Jodi Marble, MD as Referring Physician (Internal Medicine) Bary Castilla Forest Gleason, MD (General Surgery) Jodi Marble, MD (Internal Medicine)   Name of the patient: Stephen Cervantes  [id number]  March 15, 1941   Date of visit: 08/24/17  Diagnosis- 1. Thrombocytopenia resolved  2. Secondary polycythemia  Chief complaint/ Reason for visit- discuss results of bloodwork  Heme/Onc history: Patient is a 76 year old male was then referred to [place] for thrombocytopenia. The second blood work from 07/18/2016 showed normal LFTs. TSH was normal at 2.02. CBC from 01/28/2017 showed white count of 4., H&H of 16.3/48.4 with an MCV of 91 and a platelet count of 127. CBC from 04/24/2017 showed white count of 4.6 and a platelet count of 132 with an H&H of 16.9 in 48.3. Between 2013 and 2016 his platelet count was normal. BMP done 4 days ago was within normal limits  Patient has had a prior history of colectomy for diverticulosis and perforation. Currently patient denies any fatigue or unintentional weight loss. Denies any bleeding or bruising.  Results of bloodwork from 03/24/2017 were as follows: CBC showed white count of 4.9, H&H of 17.6/50.1 and a platelet count of 186. EPO levels were normal and jak 2 mutation testing was negative. HIV and hep C antibody testing was negative. B12 and folate were within normal limits. CMP was unremarkable except for mildly elevated AST of 42. Urinalysis showed 6-30 RBCs.patient sees urology for the same   Interval history- patient is doing well and he currently denies any complaints.  He does have on and off symptoms of kidney stones for which she follows up with urology  ECOG PS- 0 Pain scale- 0 Opioid associated constipation- no  Review of systems- Review of  Systems  Constitutional: Negative for chills, fever, malaise/fatigue and weight loss.  HENT: Negative for congestion, ear discharge and nosebleeds.   Eyes: Negative for blurred vision.  Respiratory: Negative for cough, hemoptysis, sputum production, shortness of breath and wheezing.   Cardiovascular: Negative for chest pain, palpitations, orthopnea and claudication.  Gastrointestinal: Negative for abdominal pain, blood in stool, constipation, diarrhea, heartburn, melena, nausea and vomiting.  Genitourinary: Negative for dysuria, flank pain, frequency, hematuria and urgency.  Musculoskeletal: Negative for back pain, joint pain and myalgias.  Skin: Negative for rash.  Neurological: Negative for dizziness, tingling, focal weakness, seizures, weakness and headaches.  Endo/Heme/Allergies: Does not bruise/bleed easily.  Psychiatric/Behavioral: Negative for depression and suicidal ideas. The patient does not have insomnia.      Allergies  Allergen Reactions  . Morphine And Related Other (See Comments)    Severe hallucinations  . Other Other (See Comments)    Silk tape - Makes skin peel  . Tape Other (See Comments)    Silk Tape per patient "peels off my skin"     Past Medical History:  Diagnosis Date  . Arthritis   . Asthma   . Bowel obstruction ([place]) 6440,3474  . Depression   . Diverticulitis 2006  . Enlarged prostate   . Gallstones 07/04/2014  . HLD (hyperlipidemia)   . HTN (hypertension)   . Kidney stone    kidney stones  . Neuritis or radiculitis due to rupture of lumbar intervertebral disc 05/29/2015  . Thyroid disease      Past Surgical History:  Procedure Laterality Date  . COLECTOMY  2006   Abdominal colectomy,  ileostomy, Hartman procedure for perforation of cecum, sigmoid diverticulitis  . HERNIA REPAIR  2008?   [place]  . ILEOSTOMY  2007  . JOINT REPLACEMENT  2014   Hip Replacement Hessie Knows  . KNEE SURGERY Right 2011    Social  History   Socioeconomic History  . Marital status: Divorced    Spouse name: Not on file  . Number of children: Not on file  . Years of education: Not on file  . Highest education level: Not on file  Social Needs  . Financial resource strain: Not on file  . Food insecurity - worry: Not on file  . Food insecurity - inability: Not on file  . Transportation needs - medical: Not on file  . Transportation needs - non-medical: Not on file  Occupational History  . Not on file  Tobacco Use  . Smoking status: Former Smoker    Types: Cigarettes    Last attempt to quit: 1998    Years since quitting: 20.9  . Smokeless tobacco: Never Used  Substance and Sexual Activity  . Alcohol use: No    Alcohol/week: 0.0 oz  . Drug use: No  . Sexual activity: Not on file  Other Topics Concern  . Not on file  Social History Narrative   ** Merged History Encounter **        Family History  Problem Relation Age of Onset  . Cancer Father        cancer of lung, brain  and lymphoma  . Parkinson's disease Brother   . Mesothelioma Brother   . Kidney disease Neg Hx   . Prostate cancer Neg Hx   . Kidney cancer Neg Hx   . Bladder Cancer Neg Hx      Current Outpatient Medications:  .  aspirin 81 MG chewable tablet, Chew by mouth., Disp: , Rfl:  .  atorvastatin (LIPITOR) 10 MG tablet, Take by mouth., Disp: , Rfl:  .  calcium-vitamin D (OSCAL WITH D) 500-200 MG-UNIT tablet, Take 2 tablets by mouth daily with breakfast. , Disp: , Rfl:  .  celecoxib (CELEBREX) 200 MG capsule, 200 mg daily. , Disp: , Rfl:  .  diphenoxylate-atropine (LOMOTIL) 2.5-0.025 MG tablet, use as directed, Disp: , Rfl:  .  hydrochlorothiazide (HYDRODIURIL) 25 MG tablet, Take 25 mg by mouth daily. , Disp: , Rfl:  .  levothyroxine (SYNTHROID, LEVOTHROID) 137 MCG tablet, , Disp: , Rfl: 0 .  loperamide (IMODIUM) 2 MG capsule, Take by mouth as needed for diarrhea or loose stools., Disp: , Rfl:  .  loratadine (CLARITIN REDITABS) 10 MG  dissolvable tablet, Take 10 mg by mouth., Disp: , Rfl:  .  Lutein 20 MG CAPS, Take 1 capsule by mouth daily., Disp: , Rfl:  .  montelukast (SINGULAIR) 10 MG tablet, Take 10 mg by mouth at bedtime. , Disp: , Rfl:  .  Multiple Vitamin (MULTI VITAMIN DAILY PO), Take 1 tablet by mouth daily., Disp: , Rfl:  .  Omega-3 Fatty Acids (FISH OIL PO), Take 1,200 tablets by mouth. Daily, Disp: , Rfl:  .  pantoprazole (PROTONIX) 40 MG tablet, Take by mouth., Disp: , Rfl:  .  potassium citrate (UROCIT-K) 10 MEQ (1080 MG) SR tablet, Take 10 mEq by mouth 3 (three) times daily with meals. , Disp: , Rfl:  .  sucralfate (CARAFATE) 1 g tablet, Take by mouth., Disp: , Rfl:  .  vitamin B-12 (CYANOCOBALAMIN) 1000 MCG tablet, Take 1,000 mcg by mouth  daily., Disp: , Rfl:   Physical exam:  Vitals:   08/24/17 1147  BP: (!) 147/96  Pulse: 71  Resp: 16  Temp: 98.1 F (36.7 C)  TempSrc: Tympanic  Weight: 188 lb (85.3 kg)   Physical Exam  Constitutional: He is oriented to person, place, and time and well-developed, well-nourished, and in no distress.  HENT:  Head: Normocephalic and atraumatic.  Eyes: EOM are normal. Pupils are equal, round, and reactive to light.  Neck: Normal range of motion.  Cardiovascular: Normal rate, regular rhythm and normal heart sounds.  Pulmonary/Chest: Effort normal and breath sounds normal.  Abdominal: Soft. Bowel sounds are normal.  Neurological: He is alert and oriented to person, place, and time.  Skin: Skin is warm and dry.     CMP Latest Ref Rng & Units 03/24/2017  Glucose 65 - 99 mg/dL 100(H)  BUN 6 - 20 mg/dL 21(H)  Creatinine 0.61 - 1.24 mg/dL 1.21  Sodium 135 - 145 mmol/L 139  Potassium 3.5 - 5.1 mmol/L 4.0  Chloride 101 - 111 mmol/L 106  CO2 22 - 32 mmol/L 28  Calcium 8.9 - 10.3 mg/dL 10.3  Total Protein 6.5 - 8.1 g/dL 7.0  Total Bilirubin 0.3 - 1.2 mg/dL 0.8  Alkaline Phos 38 - 126 U/L 75  AST 15 - 41 U/L 42(H)  ALT 17 - 63 U/L 47   CBC Latest Ref Rng & Units  08/24/2017  WBC 3.8 - 10.6 K/uL 5.5  Hemoglobin 13.0 - 18.0 g/dL 17.0  Hematocrit 40.0 - 52.0 % 49.2  Platelets 150 - 440 K/uL 170     Assessment and plan- Patient is a 77 y.o. male who sees me for following issues:  Thrombocytopenia: Resolved.  Secondary polycythemia: Etiology unclear.  Hemoglobin between 15 and 17 over the last 4 years.  Jak 2 testing negative any Po levels are normal.  He therefore does not have primary polycythemia.  I will see him back in 1 years time with a CBC with differential.  He will get an interim CBC with differential at 6 months     Visit Diagnosis 1. Secondary polycythemia      Dr. Randa Evens, MD, MPH [facility] Pager- [phone] 08/24/2017 1:18 PM

## 2018-02-22 ENCOUNTER — Inpatient Hospital Stay: Payer: Medicare Other | Attending: Oncology

## 2018-02-22 DIAGNOSIS — D751 Secondary polycythemia: Secondary | ICD-10-CM | POA: Diagnosis present

## 2018-02-22 LAB — CBC WITH DIFFERENTIAL/PLATELET
BASOS ABS: 0 10*3/uL (ref 0–0.1)
BASOS PCT: 1 %
EOS PCT: 4 %
Eosinophils Absolute: 0.2 10*3/uL (ref 0–0.7)
HCT: 48.2 % (ref 40.0–52.0)
Hemoglobin: 16.9 g/dL (ref 13.0–18.0)
Lymphocytes Relative: 24 %
Lymphs Abs: 1.2 10*3/uL (ref 1.0–3.6)
MCH: 31.3 pg (ref 26.0–34.0)
MCHC: 35 g/dL (ref 32.0–36.0)
MCV: 89.4 fL (ref 80.0–100.0)
MONO ABS: 0.5 10*3/uL (ref 0.2–1.0)
Monocytes Relative: 10 %
Neutro Abs: 3 10*3/uL (ref 1.4–6.5)
Neutrophils Relative %: 61 %
PLATELETS: 162 10*3/uL (ref 150–440)
RBC: 5.39 MIL/uL (ref 4.40–5.90)
RDW: 13.5 % (ref 11.5–14.5)
WBC: 4.9 10*3/uL (ref 3.8–10.6)

## 2018-03-21 NOTE — Progress Notes (Deleted)
03/22/2018 7:23 PM   [name] July 19, 1941 [id number]  Referring provider: Jodi Marble, MD [place], [place] [address]  No chief complaint on file.   HPI: 77 yo WM with a history of hematuria, nephrolithiasis and BPH with LU TS who presents today for a one year follow up.    History of hematuria Remote 20PY smoker.   Eval 2016 - CT Urogram - nephrolithiasis, cysto with very large prostate no episodes of gross hematuria  Nephrolithiasis Pre 2016 - medicla passage x several, URS x 1 08/2015 - medical passage left 27mm ureteral stone, LLP 1cm non-obstructing as well. No right sided stones. Composition Uric Acid. UpH 5.5--> KCit 20 BID  KUB noted increased in size of bilateral stones Cr 1.6  BPH WITH LUTS  (prostate and/or bladder) Previous IPSS score was 2/1. His major complaints today are mild intermittency and a weak stream.  He has had these symptoms for several years.  He denies any dysuria, hematuria or suprapubic pain.  He also denies any recent fevers, chills, nausea or vomiting.    PMH: Past Medical History:  Diagnosis Date  . Arthritis   . Asthma   . Bowel obstruction ([place]) 1194,1740  . Depression   . Diverticulitis 2006  . Enlarged prostate   . Gallstones 07/04/2014  . HLD (hyperlipidemia)   . HTN (hypertension)   . Kidney stone    kidney stones  . Neuritis or radiculitis due to rupture of lumbar intervertebral disc 05/29/2015  . Thyroid disease     Surgical History: Past Surgical History:  Procedure Laterality Date  . COLECTOMY  2006   Abdominal colectomy, ileostomy, Hartman procedure for perforation of cecum, sigmoid diverticulitis  . HERNIA REPAIR  2008?   [place]  . ILEOSTOMY  2007  . JOINT REPLACEMENT  2014   Hip Replacement Hessie Knows  . KNEE SURGERY Right 2011    Home Medications:  Allergies as of 03/22/2018      Reactions   Morphine And Related Other (See Comments)   Severe  hallucinations   Other Other (See Comments)   Silk tape - Makes skin peel   Tape Other (See Comments)   Silk Tape per patient "peels off my skin"      Medication List        Accurate as of 03/21/18  7:23 PM. Always use your most recent med list.          aspirin 81 MG chewable tablet Chew by mouth.   atorvastatin 10 MG tablet Commonly known as:  LIPITOR Take by mouth.   calcium-vitamin D 500-200 MG-UNIT tablet Commonly known as:  OSCAL WITH D Take 2 tablets by mouth daily with breakfast.   celecoxib 200 MG capsule Commonly known as:  CELEBREX 200 mg daily.   FISH OIL PO Take 1,200 tablets by mouth. Daily   hydrochlorothiazide 25 MG tablet Commonly known as:  HYDRODIURIL Take 25 mg by mouth daily.   levothyroxine 137 MCG tablet Commonly known as:  SYNTHROID, LEVOTHROID   LOMOTIL 2.5-0.025 MG tablet Generic drug:  diphenoxylate-atropine use as directed   loperamide 2 MG capsule Commonly known as:  IMODIUM Take by mouth as needed for diarrhea or loose stools.   loratadine 10 MG dissolvable tablet Commonly known as:  CLARITIN REDITABS Take 10 mg by mouth.   Lutein 20 MG Caps Take 1 capsule by mouth daily.   montelukast 10 MG tablet Commonly known as:  SINGULAIR Take  10 mg by mouth at bedtime.   MULTI VITAMIN DAILY PO Take 1 tablet by mouth daily.   pantoprazole 40 MG tablet Commonly known as:  PROTONIX Take by mouth.   potassium citrate 10 MEQ (1080 MG) SR tablet Commonly known as:  UROCIT-K Take 10 mEq by mouth 3 (three) times daily with meals.   sucralfate 1 g tablet Commonly known as:  CARAFATE Take by mouth.   vitamin B-12 1000 MCG tablet Commonly known as:  CYANOCOBALAMIN Take 1,000 mcg by mouth daily.       Allergies:  Allergies  Allergen Reactions  . Morphine And Related Other (See Comments)    Severe hallucinations  . Other Other (See Comments)    Silk tape - Makes skin peel  . Tape Other (See Comments)    Silk Tape per  patient "peels off my skin"    Family History: Family History  Problem Relation Age of Onset  . Cancer Father        cancer of lung, brain  and lymphoma  . Parkinson's disease Brother   . Mesothelioma Brother   . Kidney disease Neg Hx   . Prostate cancer Neg Hx   . Kidney cancer Neg Hx   . Bladder Cancer Neg Hx     Social History:  reports that he quit smoking about 21 years ago. His smoking use included cigarettes. He has never used smokeless tobacco. He reports that he does not drink alcohol or use drugs.  ROS:                                        Physical Exam: There were no vitals taken for this visit.  Constitutional: Well nourished. Alert and oriented, No acute distress. HEENT: [place] AT, moist mucus membranes. Trachea midline, no masses. Cardiovascular: No clubbing, cyanosis, or edema. Respiratory: Normal respiratory effort, no increased work of breathing. GI: Abdomen is soft, non tender, non distended, no abdominal masses. Liver and spleen not palpable.  No hernias appreciated.  Stool sample for occult testing is not indicated.   GU: No CVA tenderness.  No bladder fullness or masses.  Patient with circumcised phallus.  Urethral meatus is patent.  No penile discharge. No penile lesions or rashes. Scrotum without lesions, cysts, rashes and/or edema.  Testicles are located scrotally bilaterally. No masses are appreciated in the testicles. Left and right epididymis are normal. Rectal: Patient with  normal sphincter tone. Anus and perineum without scarring or rashes. No rectal masses are appreciated. Prostate is approximately 60 grams, no nodules are appreciated. Seminal vesicles are normal. Skin: No rashes, bruises or suspicious lesions. Lymph: No cervical or inguinal adenopathy. Neurologic: Grossly intact, no focal deficits, moving all 4 extremities. Psychiatric: Normal mood and affect.   Laboratory data Results for orders placed or performed in visit  on 02/22/18  CBC with Differential  Result Value Ref Range   WBC 4.9 3.8 - 10.6 K/uL   RBC 5.39 4.40 - 5.90 MIL/uL   Hemoglobin 16.9 13.0 - 18.0 g/dL   HCT 48.2 40.0 - 52.0 %   MCV 89.4 80.0 - 100.0 fL   MCH 31.3 26.0 - 34.0 pg   MCHC 35.0 32.0 - 36.0 g/dL   RDW 13.5 11.5 - 14.5 %   Platelets 162 150 - 440 K/uL   Neutrophils Relative % 61 %   Neutro Abs 3.0 1.4 - 6.5 K/uL  Lymphocytes Relative 24 %   Lymphs Abs 1.2 1.0 - 3.6 K/uL   Monocytes Relative 10 %   Monocytes Absolute 0.5 0.2 - 1.0 K/uL   Eosinophils Relative 4 %   Eosinophils Absolute 0.2 0 - 0.7 K/uL   Basophils Relative 1 %   Basophils Absolute 0.0 0 - 0.1 K/uL   I have reviewed the labs  Pertinent Imaging: *** I have independently reviewed the films.    Assessment & Plan:   1. History of hematuria Hematuria work up completed 2017 - no malignancy is found No reports of gross hematuria RTC  in one year for symptom recheck  2. Bilateral nephrolithiasis  - increasing in size  - discussed treatment options with the patient and he would like to defer any treatment at this time  - KUB in one year  3. BPH with LUTS  - IPSS score is 2/1  - Continue conservative management, avoiding bladder irritants and timed voiding's  - most bothersome symptoms is/are intermittency and a weak stream  - RTC in 12 months for IPSS and exam      No follow-ups on file.  These notes generated with voice recognition software. I apologize for typographical errors.  Zara Council, PA-C  [facility] [address] [place], [place] [address] [phone]

## 2018-03-22 ENCOUNTER — Ambulatory Visit
Admission: RE | Admit: 2018-03-22 | Discharge: 2018-03-22 | Disposition: A | Discharge status: Home / Self Care | Payer: Medicare Other | Source: Ambulatory Visit | Attending: Urology | Admitting: Urology

## 2018-03-22 ENCOUNTER — Ambulatory Visit: Payer: Medicare Other | Admitting: Urology

## 2018-03-22 DIAGNOSIS — N200 Calculus of kidney: Secondary | ICD-10-CM

## 2018-03-27 NOTE — Progress Notes (Signed)
03/28/2018 3:03 PM   [name] [birth year] [id number]  Referring provider: Jodi Marble, MD [place], [place] [address]  Chief Complaint  Patient presents with  . Nephrolithiasis    1year    HPI: 77 yo WM with a history of hematuria, nephrolithiasis and BPH with LU TS who presents today for a one year follow up.    History of hematuria Remote 20PY smoker.   Eval 2016 - CT Urogram - nephrolithiasis, cysto with very large prostate no episodes of gross hematuria  Nephrolithiasis Pre 2016 - medicla passage x several, URS x 1 08/2015 - medical passage left 13mm ureteral stone, LLP 1cm non-obstructing as well. No right sided stones. Composition Uric Acid. UpH 5.5--> KCit 20 BID  KUB noted increased in size of bilateral stones Cr 1.6 KUB on 03/22/2018 noted nephrolithiasis, grossly stable. If further investigation desired, consider CT urography.  BPH WITH LUTS  (prostate and/or bladder) Previous IPSS score was 2/1. His major complaints today are mild intermittency and a weak stream.  He has had these symptoms for several years.  He denies any dysuria, hematuria or suprapubic pain.  He also denies any recent fevers, chills, nausea or vomiting.    PMH: Past Medical History:  Diagnosis Date  . Arthritis   . Asthma   . Bowel obstruction ([place]) [id number]  . Depression   . Diverticulitis 2006  . Enlarged prostate   . Gallstones 07/04/2014  . HLD (hyperlipidemia)   . HTN (hypertension)   . Kidney stone    kidney stones  . Neuritis or radiculitis due to rupture of lumbar intervertebral disc 05/29/2015  . Thyroid disease     Surgical History: Past Surgical History:  Procedure Laterality Date  . COLECTOMY  2006   Abdominal colectomy, ileostomy, Hartman procedure for perforation of cecum, sigmoid diverticulitis  . HERNIA REPAIR  2008?   [place]  . ILEOSTOMY  2007  . JOINT REPLACEMENT  2014   Hip Replacement [facility]  .  KNEE SURGERY Right 2011    Home Medications:  Allergies as of 03/28/2018      Reactions   Morphine And Related Other (See Comments)   Severe hallucinations   Other Other (See Comments)   Silk tape - Makes skin peel   Tape Other (See Comments)   Silk Tape per patient "peels off my skin"      Medication List        Accurate as of 03/28/18  3:03 PM. Always use your most recent med list.          aspirin 81 MG chewable tablet Chew by mouth.   atorvastatin 10 MG tablet Commonly known as:  LIPITOR Take by mouth.   calcium-vitamin D 500-200 MG-UNIT tablet Commonly known as:  OSCAL WITH D Take 2 tablets by mouth daily with breakfast.   celecoxib 200 MG capsule Commonly known as:  CELEBREX 200 mg daily.   FISH OIL PO Take 1,200 tablets by mouth. Daily   hydrochlorothiazide 25 MG tablet Commonly known as:  HYDRODIURIL Take 25 mg by mouth daily.   levothyroxine 137 MCG tablet Commonly known as:  SYNTHROID, LEVOTHROID   LOMOTIL 2.5-0.025 MG tablet Generic drug:  diphenoxylate-atropine use as directed   loperamide 2 MG capsule Commonly known as:  IMODIUM Take by mouth as needed for diarrhea or loose stools.   loratadine 10 MG dissolvable tablet Commonly known as:  CLARITIN REDITABS Take 10 mg by mouth.  Lutein 20 MG Caps Take 1 capsule by mouth daily.   montelukast 10 MG tablet Commonly known as:  SINGULAIR Take 10 mg by mouth at bedtime.   MULTI VITAMIN DAILY PO Take 1 tablet by mouth daily.   pantoprazole 40 MG tablet Commonly known as:  PROTONIX Take by mouth.   potassium citrate 10 MEQ (1080 MG) SR tablet Commonly known as:  UROCIT-K Take 10 mEq by mouth 3 (three) times daily with meals.   sucralfate 1 g tablet Commonly known as:  CARAFATE Take by mouth.   vitamin B-12 1000 MCG tablet Commonly known as:  CYANOCOBALAMIN Take 1,000 mcg by mouth daily.       Allergies:  Allergies  Allergen Reactions  . Morphine And Related Other (See  Comments)    Severe hallucinations  . Other Other (See Comments)    Silk tape - Makes skin peel  . Tape Other (See Comments)    Silk Tape per patient "peels off my skin"    Family History: Family History  Problem Relation Age of Onset  . Cancer Father        cancer of lung, brain  and lymphoma  . Parkinson's disease Brother   . Mesothelioma Brother   . Kidney disease Neg Hx   . Prostate cancer Neg Hx   . Kidney cancer Neg Hx   . Bladder Cancer Neg Hx     Social History:  reports that he quit smoking about 21 years ago. His smoking use included cigarettes. He has never used smokeless tobacco. He reports that he does not drink alcohol or use drugs.  ROS: UROLOGY Frequent Urination?: No Hard to postpone urination?: No Burning/pain with urination?: No Get up at night to urinate?: No Leakage of urine?: No Urine stream starts and stops?: No Trouble starting stream?: No Do you have to strain to urinate?: No Blood in urine?: No Urinary tract infection?: No Sexually transmitted disease?: No Injury to kidneys or bladder?: No Painful intercourse?: No Weak stream?: No Erection problems?: No Penile pain?: No  Gastrointestinal Nausea?: No Vomiting?: No Indigestion/heartburn?: No Diarrhea?: No Constipation?: No  Constitutional Fever: No Night sweats?: No Weight loss?: No Fatigue?: No  Skin Skin rash/lesions?: No Itching?: No  Eyes Blurred vision?: No Double vision?: No  Ears/Nose/Throat Sore throat?: No Sinus problems?: No  Hematologic/Lymphatic Swollen glands?: No Easy bruising?: No  Cardiovascular Leg swelling?: No Chest pain?: No  Respiratory Cough?: No Shortness of breath?: No  Endocrine Excessive thirst?: No  Musculoskeletal Back pain?: No Joint pain?: No  Neurological Headaches?: No Dizziness?: No  Psychologic Depression?: No Anxiety?: No  Physical Exam: BP (!) 162/84   Pulse 81   Ht 5\' 10"  (1.778 m)   Wt 185 lb (83.9 kg)   BMI  26.54 kg/m   Constitutional: Well nourished. Alert and oriented, No acute distress. HEENT: [place] AT, moist mucus membranes. Trachea midline, no masses. Cardiovascular: No clubbing, cyanosis, or edema. Respiratory: Normal respiratory effort, no increased work of breathing. GI: Abdomen is soft, non tender, non distended, no abdominal masses. Liver and spleen not palpable.  No hernias appreciated.  Stool sample for occult testing is not indicated.   GU: No CVA tenderness.  No bladder fullness or masses.  Patient with circumcised phallus.  Urethral meatus is patent.  No penile discharge. No penile lesions or rashes. Scrotum without lesions, cysts, rashes and/or edema.  Testicles are located scrotally bilaterally. No masses are appreciated in the testicles. Left and right epididymis are normal. Rectal:  Patient with  normal sphincter tone. Anus and perineum without scarring or rashes. No rectal masses are appreciated. Prostate is approximately 60 grams, no nodules are appreciated. Seminal vesicles are normal. Skin: No rashes, bruises or suspicious lesions. Lymph: No cervical or inguinal adenopathy. Neurologic: Grossly intact, no focal deficits, moving all 4 extremities. Psychiatric: Normal mood and affect.   Laboratory data Results for orders placed or performed in visit on 02/22/18  CBC with Differential  Result Value Ref Range   WBC 4.9 3.8 - 10.6 K/uL   RBC 5.39 4.40 - 5.90 MIL/uL   Hemoglobin 16.9 13.0 - 18.0 g/dL   HCT 48.2 40.0 - 52.0 %   MCV 89.4 80.0 - 100.0 fL   MCH 31.3 26.0 - 34.0 pg   MCHC 35.0 32.0 - 36.0 g/dL   RDW 13.5 11.5 - 14.5 %   Platelets 162 150 - 440 K/uL   Neutrophils Relative % 61 %   Neutro Abs 3.0 1.4 - 6.5 K/uL   Lymphocytes Relative 24 %   Lymphs Abs 1.2 1.0 - 3.6 K/uL   Monocytes Relative 10 %   Monocytes Absolute 0.5 0.2 - 1.0 K/uL   Eosinophils Relative 4 %   Eosinophils Absolute 0.2 0 - 0.7 K/uL   Basophils Relative 1 %   Basophils Absolute 0.0 0 - 0.1  K/uL   I have reviewed the labs  Pertinent Imaging: CLINICAL DATA:  Kidney stones, no problems RIGHT now.  EXAM: ABDOMEN - 1 VIEW  COMPARISON:  03/20/2017  FINDINGS: BILATERAL renal calculi are redemonstrated, appear grossly unchanged from prior radiograph 2018. Degenerative scoliosis, convex LEFT mid lumbar region. RIGHT THA. Surgical staples in the pelvis. Vascular calcification.  IMPRESSION: Nephrolithiasis, grossly stable. If further investigation desired, consider CT urography.   Electronically Signed   By: Staci Righter M.D.   On: 03/22/2018 14:39 I have independently reviewed the films.    Assessment & Plan:   1. History of hematuria Hematuria work up completed 2017 - no malignancy is found No reports of gross hematuria RTC  in one year for symptom recheck  2. Bilateral nephrolithiasis Increasing in size Discussed treatment options with the patient and he would like to defer any treatment at this time KUB in one year Advised to contact our office or seek treatment in the ED if becomes febrile or pain/ vomiting are difficult control in order to arrange for emergent/urgent intervention  3. BPH with LUTS Continue conservative management, avoiding bladder irritants and timed voiding's RTC in 12 months for exam   Return in about 1 year (around 03/29/2019) for KUB and exam.  These notes generated with voice recognition software. I apologize for typographical errors.  Zara Council, PA-C  [facility] [address] [place], [place] [address] [phone]

## 2018-03-28 ENCOUNTER — Ambulatory Visit (INDEPENDENT_AMBULATORY_CARE_PROVIDER_SITE_OTHER): Payer: Medicare Other | Admitting: Urology

## 2018-03-28 ENCOUNTER — Encounter: Payer: Self-pay | Admitting: Urology

## 2018-03-28 VITALS — BP 162/84 | HR 81 | Ht 70.0 in | Wt 185.0 lb

## 2018-03-28 DIAGNOSIS — Z87448 Personal history of other diseases of urinary system: Secondary | ICD-10-CM | POA: Diagnosis not present

## 2018-03-28 DIAGNOSIS — N138 Other obstructive and reflux uropathy: Secondary | ICD-10-CM | POA: Diagnosis not present

## 2018-03-28 DIAGNOSIS — N200 Calculus of kidney: Secondary | ICD-10-CM

## 2018-03-28 DIAGNOSIS — N401 Enlarged prostate with lower urinary tract symptoms: Secondary | ICD-10-CM | POA: Diagnosis not present

## 2018-06-28 DIAGNOSIS — M6530 Trigger finger, unspecified finger: Secondary | ICD-10-CM | POA: Insufficient documentation

## 2018-08-13 ENCOUNTER — Ambulatory Visit (INDEPENDENT_AMBULATORY_CARE_PROVIDER_SITE_OTHER): Payer: Medicare Other | Admitting: Podiatry

## 2018-08-13 ENCOUNTER — Encounter: Payer: Self-pay | Admitting: Podiatry

## 2018-08-13 DIAGNOSIS — L600 Ingrowing nail: Secondary | ICD-10-CM

## 2018-08-13 DIAGNOSIS — L089 Local infection of the skin and subcutaneous tissue, unspecified: Secondary | ICD-10-CM

## 2018-08-13 DIAGNOSIS — S90821A Blister (nonthermal), right foot, initial encounter: Secondary | ICD-10-CM

## 2018-08-13 NOTE — Progress Notes (Signed)
This patient presents to the office for an evaluation of his big toe right foot.  He says there was severe pain noted along the inside border right big toe last night.  He says he is painfree right now.  He says he has been experiencing pain on and off for three weeks.  He says he believes he has a blister under his nail at the site of his previous pain.  He has provided no self treatment nor sought professional help.  He presents to the office for evaluation and treatment.  Vascular  Dorsalis pedis and posterior tibial pulses are palpable  B/L.  Capillary return  WNL.  Temperature gradient is  WNL.  Skin turgor  WNL  Sensorium  Senn Weinstein monofilament wire  WNL. Normal tactile sensation.  Nail Exam  Patient has normal nails with no evidence of bacterial or fungal infection. Patient has marked incurvation noted in the medial aspect right great toenail.    Orthopedic  Exam  Muscle tone and muscle strength  WNL.  No limitations of motion feet  B/L.  No crepitus or joint effusion noted.  Foot type is unremarkable and digits show no abnormalities.  Bony prominences are unremarkable.  Skin  No open lesions.  Normal skin texture and turgor.  Ingrowing nail.  Onychomycosis  Hallux  Right foot.  IE>  Debride nail border medial border right great toe.  Subungual blister noted.  Discussed this condition with this patient.  We chose to perform nail surgery in future if this problem persists.  He will soak and peroxide this toe until healed or he returns for continued treatment    Gardiner Barefoot DPM

## 2018-08-23 ENCOUNTER — Inpatient Hospital Stay: Payer: Medicare Other | Attending: Oncology

## 2018-08-23 ENCOUNTER — Inpatient Hospital Stay: Payer: Medicare Other | Admitting: Oncology

## 2018-11-02 ENCOUNTER — Other Ambulatory Visit: Payer: Self-pay | Admitting: Student

## 2018-11-02 DIAGNOSIS — M545 Low back pain, unspecified: Secondary | ICD-10-CM

## 2018-11-02 DIAGNOSIS — M419 Scoliosis, unspecified: Secondary | ICD-10-CM

## 2018-11-02 DIAGNOSIS — G9519 Other vascular myelopathies: Secondary | ICD-10-CM

## 2018-11-02 DIAGNOSIS — M48062 Spinal stenosis, lumbar region with neurogenic claudication: Secondary | ICD-10-CM

## 2018-11-11 ENCOUNTER — Ambulatory Visit
Admission: RE | Admit: 2018-11-11 | Discharge: 2018-11-11 | Disposition: A | Discharge status: Home / Self Care | Payer: Medicare Other | Source: Ambulatory Visit | Attending: Student | Admitting: Student

## 2018-11-11 ENCOUNTER — Other Ambulatory Visit: Payer: Self-pay

## 2018-11-11 DIAGNOSIS — M419 Scoliosis, unspecified: Secondary | ICD-10-CM | POA: Diagnosis present

## 2018-11-11 DIAGNOSIS — M48062 Spinal stenosis, lumbar region with neurogenic claudication: Secondary | ICD-10-CM | POA: Insufficient documentation

## 2018-11-11 DIAGNOSIS — M545 Low back pain, unspecified: Secondary | ICD-10-CM

## 2018-11-11 DIAGNOSIS — G9519 Other vascular myelopathies: Secondary | ICD-10-CM

## 2019-03-08 ENCOUNTER — Other Ambulatory Visit: Payer: Self-pay

## 2019-03-08 ENCOUNTER — Encounter
Admission: RE | Admit: 2019-03-08 | Discharge: 2019-03-08 | Disposition: A | Discharge status: Home / Self Care | Payer: Medicare Other | Source: Ambulatory Visit | Attending: Neurosurgery | Admitting: Neurosurgery

## 2019-03-08 DIAGNOSIS — Z01810 Encounter for preprocedural cardiovascular examination: Secondary | ICD-10-CM | POA: Diagnosis not present

## 2019-03-08 DIAGNOSIS — R9431 Abnormal electrocardiogram [ECG] [EKG]: Secondary | ICD-10-CM | POA: Diagnosis not present

## 2019-03-08 DIAGNOSIS — Z1159 Encounter for screening for other viral diseases: Secondary | ICD-10-CM | POA: Diagnosis not present

## 2019-03-08 DIAGNOSIS — I440 Atrioventricular block, first degree: Secondary | ICD-10-CM | POA: Insufficient documentation

## 2019-03-08 DIAGNOSIS — Z01818 Encounter for other preprocedural examination: Secondary | ICD-10-CM | POA: Diagnosis present

## 2019-03-08 HISTORY — DX: Personal history of other diseases of the digestive system: Z87.19

## 2019-03-08 HISTORY — DX: Personal history of urinary calculi: Z87.442

## 2019-03-08 LAB — PROTIME-INR
INR: 1 (ref 0.8–1.2)
Prothrombin Time: 12.9 seconds (ref 11.4–15.2)

## 2019-03-08 LAB — URINALYSIS, ROUTINE W REFLEX MICROSCOPIC
Bilirubin Urine: NEGATIVE
Glucose, UA: NEGATIVE mg/dL
Hgb urine dipstick: NEGATIVE
Ketones, ur: NEGATIVE mg/dL
Leukocytes,Ua: NEGATIVE
Nitrite: NEGATIVE
Protein, ur: NEGATIVE mg/dL
Specific Gravity, Urine: 1.018 (ref 1.005–1.030)
pH: 5 (ref 5.0–8.0)

## 2019-03-08 LAB — DIFFERENTIAL
Abs Immature Granulocytes: 0.01 10*3/uL (ref 0.00–0.07)
Basophils Absolute: 0 10*3/uL (ref 0.0–0.1)
Basophils Relative: 1 %
Eosinophils Absolute: 0.3 10*3/uL (ref 0.0–0.5)
Eosinophils Relative: 7 %
Immature Granulocytes: 0 %
Lymphocytes Relative: 24 %
Lymphs Abs: 1.1 10*3/uL (ref 0.7–4.0)
Monocytes Absolute: 0.4 10*3/uL (ref 0.1–1.0)
Monocytes Relative: 9 %
Neutro Abs: 2.7 10*3/uL (ref 1.7–7.7)
Neutrophils Relative %: 59 %

## 2019-03-08 LAB — BASIC METABOLIC PANEL
Anion gap: 7 (ref 5–15)
BUN: 22 mg/dL (ref 8–23)
CO2: 23 mmol/L (ref 22–32)
Calcium: 9.2 mg/dL (ref 8.9–10.3)
Chloride: 109 mmol/L (ref 98–111)
Creatinine, Ser: 1.02 mg/dL (ref 0.61–1.24)
GFR calc Af Amer: >60 mL/min (ref >60)
GFR calc non Af Amer: >60 mL/min (ref >60)
Glucose, Bld: 96 mg/dL (ref 70–99)
Potassium: 3.9 mmol/L (ref 3.5–5.1)
Sodium: 139 mmol/L (ref 135–145)

## 2019-03-08 LAB — TYPE AND SCREEN
ABO/RH(D): B POS
Antibody Screen: NEGATIVE

## 2019-03-08 LAB — SURGICAL PCR SCREEN
MRSA, PCR: NEGATIVE
Staphylococcus aureus: POSITIVE — AB

## 2019-03-08 LAB — CBC
HCT: 42.5 % (ref 39.0–52.0)
Hemoglobin: 14.9 g/dL (ref 13.0–17.0)
MCH: 30.2 pg (ref 26.0–34.0)
MCHC: 35.1 g/dL (ref 30.0–36.0)
MCV: 86.2 fL (ref 80.0–100.0)
Platelets: 156 10*3/uL (ref 150–400)
RBC: 4.93 MIL/uL (ref 4.22–5.81)
RDW: 13 % (ref 11.5–15.5)
WBC: 4.6 10*3/uL (ref 4.0–10.5)
nRBC: 0 % (ref 0.0–0.2)

## 2019-03-08 LAB — APTT: aPTT: 36 seconds (ref 24–36)

## 2019-03-08 NOTE — Pre-Procedure Instructions (Signed)
Faxed PCR results to [facility] office.

## 2019-03-08 NOTE — Patient Instructions (Signed)
Your procedure is scheduled on: Wednesday 03/13/19 Report to [facility]. To find out your arrival time please call [phone] between 1PM - 3PM on Tuesday 03/12/19.  Remember: Instructions that are not followed completely may result in serious medical risk, up to and including death, or upon the discretion of your surgeon and anesthesiologist your surgery may need to be rescheduled.     _X__ 1. Do not eat food after midnight the night before your procedure.                 No gum chewing or hard candies. You may drink clear liquids up to 2 hours                 before you are scheduled to arrive for your surgery- DO not drink clear                 liquids within 2 hours of the start of your surgery.                 Clear Liquids include:  water, apple juice without pulp, clear carbohydrate                 drink such as Clearfast or Gatorade, Black Coffee or Tea (Do not add                 anything to coffee or tea). Diabetics water only  __X__2.  On the morning of surgery brush your teeth with toothpaste and water, you                 may rinse your mouth with mouthwash if you wish.  Do not swallow any              toothpaste of mouthwash.     _X__ 3.  No Alcohol for 24 hours before or after surgery.   _X__ 4.  Do Not Smoke or use e-cigarettes For 24 Hours Prior to Your Surgery.                 Do not use any chewable tobacco products for at least 6 hours prior to                 surgery.  ____  5.  Bring all medications with you on the day of surgery if instructed.   __X__  6.  Notify your doctor if there is any change in your medical condition      (cold, fever, infections).     Do not wear jewelry, make-up, hairpins, clips or nail polish. Do not wear lotions, powders, or perfumes.  Do not shave 48 hours prior to surgery. Men may shave face and neck. Do not bring valuables to the hospital.    [facility] is not responsible for  any belongings or valuables.  Contacts, dentures/partials or body piercings may not be worn into surgery. Bring a case for your contacts, glasses or hearing aids, a denture cup will be supplied. Leave your suitcase in the car. After surgery it may be brought to your room. For patients admitted to the hospital, discharge time is determined by your treatment team.   Patients discharged the day of surgery will not be allowed to drive home.   Please read over the following fact sheets that you were given:   MRSA Information  __X__ Take these medicines the morning of surgery with A SIP OF WATER:  1. atorvastatin (LIPITOR)   2. levothyroxine (SYNTHROID  3. loratadine (CLARITIN)  4. montelukast (SINGULAIR)   5. pantoprazole (PROTONIX)   6.  ____ Fleet Enema (as directed)   __X__ Use CHG Soap/SAGE wipes as directed  __X__ Use inhalers on the day of surgery  ____ Stop metformin/Janumet/Farxiga 2 days prior to surgery    ____ Take 1/2 of usual insulin dose the night before surgery. No insulin the morning          of surgery.   ____ Stop Blood Thinners Coumadin/Plavix/Xarelto/Pleta/Pradaxa/Eliquis/Effient/Aspirin  on   Or contact your Surgeon, Cardiologist or Medical Doctor regarding  ability to stop your blood thinners  __X__ Stop Anti-inflammatories 7 days before surgery such as Advil, Ibuprofen, Motrin,  BC or Goodies Powder, Naprosyn, Naproxen, Aleve, Aspirin    __X__ Stop all herbal supplements, fish oil or vitamin E until after surgery.    ____ Bring C-Pap to the hospital.

## 2019-03-09 LAB — SARS CORONAVIRUS 2 (TAT 6-24 HRS): SARS Coronavirus 2: NEGATIVE

## 2019-03-11 NOTE — Pre-Procedure Instructions (Signed)
No change in antibiotic orders r/t PCR staph +. Per Annell Greening at MD office. Note in chart.

## 2019-03-13 ENCOUNTER — Encounter
Admission: RE | Disposition: A | Discharge status: Home Health | Payer: Self-pay | Source: Ambulatory Visit | Attending: Neurosurgery

## 2019-03-13 ENCOUNTER — Inpatient Hospital Stay: Payer: Medicare Other

## 2019-03-13 ENCOUNTER — Inpatient Hospital Stay: Payer: Medicare Other | Admitting: Certified Registered Nurse Anesthetist

## 2019-03-13 ENCOUNTER — Encounter: Payer: Self-pay | Admitting: *Deleted

## 2019-03-13 ENCOUNTER — Other Ambulatory Visit: Payer: Self-pay

## 2019-03-13 ENCOUNTER — Inpatient Hospital Stay
Admission: RE | Admit: 2019-03-13 | Discharge: 2019-03-16 | DRG: 455 | Disposition: A | Discharge status: Home Health | Payer: Medicare Other | Source: Ambulatory Visit | Attending: Neurosurgery | Admitting: Neurosurgery

## 2019-03-13 DIAGNOSIS — M4316 Spondylolisthesis, lumbar region: Secondary | ICD-10-CM | POA: Diagnosis present

## 2019-03-13 DIAGNOSIS — M5136 Other intervertebral disc degeneration, lumbar region: Secondary | ICD-10-CM | POA: Diagnosis present

## 2019-03-13 DIAGNOSIS — M545 Low back pain: Secondary | ICD-10-CM | POA: Diagnosis present

## 2019-03-13 DIAGNOSIS — E785 Hyperlipidemia, unspecified: Secondary | ICD-10-CM | POA: Diagnosis present

## 2019-03-13 DIAGNOSIS — Z96641 Presence of right artificial hip joint: Secondary | ICD-10-CM | POA: Diagnosis present

## 2019-03-13 DIAGNOSIS — Z91048 Other nonmedicinal substance allergy status: Secondary | ICD-10-CM

## 2019-03-13 DIAGNOSIS — Z79899 Other long term (current) drug therapy: Secondary | ICD-10-CM

## 2019-03-13 DIAGNOSIS — Z885 Allergy status to narcotic agent status: Secondary | ICD-10-CM | POA: Diagnosis not present

## 2019-03-13 DIAGNOSIS — Z8249 Family history of ischemic heart disease and other diseases of the circulatory system: Secondary | ICD-10-CM

## 2019-03-13 DIAGNOSIS — M48062 Spinal stenosis, lumbar region with neurogenic claudication: Principal | ICD-10-CM | POA: Diagnosis present

## 2019-03-13 DIAGNOSIS — Z981 Arthrodesis status: Secondary | ICD-10-CM

## 2019-03-13 DIAGNOSIS — E039 Hypothyroidism, unspecified: Secondary | ICD-10-CM | POA: Diagnosis present

## 2019-03-13 DIAGNOSIS — I10 Essential (primary) hypertension: Secondary | ICD-10-CM | POA: Diagnosis present

## 2019-03-13 DIAGNOSIS — Z87442 Personal history of urinary calculi: Secondary | ICD-10-CM

## 2019-03-13 DIAGNOSIS — M419 Scoliosis, unspecified: Secondary | ICD-10-CM | POA: Diagnosis present

## 2019-03-13 DIAGNOSIS — Z79890 Hormone replacement therapy (postmenopausal): Secondary | ICD-10-CM | POA: Diagnosis not present

## 2019-03-13 DIAGNOSIS — Z419 Encounter for procedure for purposes other than remedying health state, unspecified: Secondary | ICD-10-CM

## 2019-03-13 HISTORY — PX: POSTERIOR LUMBAR FUSION 4 LEVEL: SHX6037

## 2019-03-13 LAB — ABO/RH: ABO/RH(D): B POS

## 2019-03-13 SURGERY — POSTERIOR LUMBAR FUSION 4 LEVEL
Anesthesia: General | Site: Back

## 2019-03-13 MED ORDER — PROPOFOL 10 MG/ML IV BOLUS
INTRAVENOUS | Status: DC | PRN
  Administered 2019-03-13: 130 mg via INTRAVENOUS

## 2019-03-13 MED ORDER — ROCURONIUM BROMIDE 100 MG/10ML IV SOLN
INTRAVENOUS | Status: DC | PRN
  Administered 2019-03-13: 5 mg via INTRAVENOUS

## 2019-03-13 MED ORDER — ATORVASTATIN CALCIUM 10 MG PO TABS
10.0000 mg | ORAL_TABLET | Freq: Every day | ORAL | Status: DC
  Administered 2019-03-14 – 2019-03-15 (×2): 10 mg via ORAL
  Filled 2019-03-13 (×2): qty 1

## 2019-03-13 MED ORDER — SENNA 8.6 MG PO TABS
1.0000 | ORAL_TABLET | Freq: Two times a day (BID) | ORAL | Status: DC
  Administered 2019-03-13 – 2019-03-15 (×3): 8.6 mg via ORAL
  Filled 2019-03-13 (×6): qty 1

## 2019-03-13 MED ORDER — DIAZEPAM 5 MG PO TABS
5.0000 mg | ORAL_TABLET | Freq: Once | ORAL | Status: DC
  Filled 2019-03-13: qty 1

## 2019-03-13 MED ORDER — SODIUM CHLORIDE 0.9 % IV SOLN
INTRAVENOUS | Status: DC | PRN
  Administered 2019-03-13: 40 mL

## 2019-03-13 MED ORDER — SODIUM CHLORIDE 0.9 % IV SOLN
INTRAVENOUS | Status: DC
  Administered 2019-03-13: 21:00:00 via INTRAVENOUS

## 2019-03-13 MED ORDER — BUPIVACAINE HCL (PF) 0.5 % IJ SOLN
INTRAMUSCULAR | Status: DC | PRN
  Administered 2019-03-13: 20 mL

## 2019-03-13 MED ORDER — PHENOL 1.4 % MT LIQD
1.0000 | OROMUCOSAL | Status: DC | PRN
  Filled 2019-03-13: qty 177

## 2019-03-13 MED ORDER — REMIFENTANIL HCL 1 MG IV SOLR
INTRAVENOUS | Status: AC
  Filled 2019-03-13: qty 2000

## 2019-03-13 MED ORDER — ACETAMINOPHEN 10 MG/ML IV SOLN
INTRAVENOUS | Status: AC
  Filled 2019-03-13: qty 100

## 2019-03-13 MED ORDER — BISACODYL 5 MG PO TBEC: 5.0000 mg | DELAYED_RELEASE_TABLET | Freq: Every day | ORAL | Status: DC | PRN

## 2019-03-13 MED ORDER — BUPIVACAINE-EPINEPHRINE (PF) 0.5% -1:200000 IJ SOLN
INTRAMUSCULAR | Status: AC
  Filled 2019-03-13: qty 30

## 2019-03-13 MED ORDER — DEXAMETHASONE SODIUM PHOSPHATE 10 MG/ML IJ SOLN
INTRAMUSCULAR | Status: AC
  Filled 2019-03-13: qty 1

## 2019-03-13 MED ORDER — HYDROCHLOROTHIAZIDE 25 MG PO TABS
25.0000 mg | ORAL_TABLET | Freq: Every day | ORAL | Status: DC
  Administered 2019-03-14 – 2019-03-16 (×3): 25 mg via ORAL
  Filled 2019-03-13 (×3): qty 1

## 2019-03-13 MED ORDER — PHENYLEPHRINE HCL (PRESSORS) 10 MG/ML IV SOLN
INTRAVENOUS | Status: DC | PRN
  Administered 2019-03-13 (×3): 100 ug via INTRAVENOUS

## 2019-03-13 MED ORDER — LIDOCAINE HCL (CARDIAC) PF 100 MG/5ML IV SOSY
PREFILLED_SYRINGE | INTRAVENOUS | Status: DC | PRN
  Administered 2019-03-13: 80 mg via INTRAVENOUS

## 2019-03-13 MED ORDER — ONDANSETRON HCL 4 MG/2ML IJ SOLN
INTRAMUSCULAR | Status: AC
  Filled 2019-03-13: qty 2

## 2019-03-13 MED ORDER — LACTATED RINGERS IV SOLN
INTRAVENOUS | Status: DC
  Administered 2019-03-13 (×2): via INTRAVENOUS

## 2019-03-13 MED ORDER — PROPOFOL 500 MG/50ML IV EMUL
INTRAVENOUS | Status: AC
  Filled 2019-03-13: qty 100

## 2019-03-13 MED ORDER — CEFAZOLIN SODIUM-DEXTROSE 2-4 GM/100ML-% IV SOLN
2.0000 g | Freq: Once | INTRAVENOUS | Status: AC
  Administered 2019-03-13 (×2): 2 g via INTRAVENOUS

## 2019-03-13 MED ORDER — SODIUM CHLORIDE 0.9 % IV SOLN: 250.0000 mL | INTRAVENOUS | Status: DC

## 2019-03-13 MED ORDER — CEFAZOLIN SODIUM 1 G IJ SOLR
INTRAMUSCULAR | Status: AC
  Filled 2019-03-13: qty 20

## 2019-03-13 MED ORDER — EPHEDRINE SULFATE 50 MG/ML IJ SOLN
INTRAMUSCULAR | Status: DC | PRN
  Administered 2019-03-13: 5 mg via INTRAVENOUS
  Administered 2019-03-13: 10 mg via INTRAVENOUS

## 2019-03-13 MED ORDER — ROCURONIUM BROMIDE 50 MG/5ML IV SOLN
INTRAVENOUS | Status: AC
  Filled 2019-03-13: qty 1

## 2019-03-13 MED ORDER — METHOCARBAMOL 750 MG PO TABS
750.0000 mg | ORAL_TABLET | Freq: Four times a day (QID) | ORAL | Status: DC
  Administered 2019-03-13 – 2019-03-14 (×2): 750 mg via ORAL
  Filled 2019-03-13 (×5): qty 1

## 2019-03-13 MED ORDER — LIDOCAINE HCL 4 % MT SOLN
OROMUCOSAL | Status: DC | PRN
  Administered 2019-03-13: 4 mL via TOPICAL

## 2019-03-13 MED ORDER — VANCOMYCIN HCL IN DEXTROSE 1-5 GM/200ML-% IV SOLN
INTRAVENOUS | Status: AC
  Administered 2019-03-13: 10:00:00 1000 mg via INTRAVENOUS
  Filled 2019-03-13: qty 200

## 2019-03-13 MED ORDER — BACITRACIN 50000 UNITS IM SOLR
INTRAMUSCULAR | Status: AC
  Filled 2019-03-13: qty 1

## 2019-03-13 MED ORDER — ACETAMINOPHEN 10 MG/ML IV SOLN
INTRAVENOUS | Status: DC | PRN
  Administered 2019-03-13: 1000 mg via INTRAVENOUS

## 2019-03-13 MED ORDER — POLYETHYLENE GLYCOL 3350 17 G PO PACK: 17.0000 g | PACK | Freq: Every day | ORAL | Status: DC | PRN

## 2019-03-13 MED ORDER — SUCRALFATE 1 G PO TABS
1.0000 g | ORAL_TABLET | Freq: Two times a day (BID) | ORAL | Status: DC
  Administered 2019-03-13 – 2019-03-16 (×6): 1 g via ORAL
  Filled 2019-03-13 (×6): qty 1

## 2019-03-13 MED ORDER — BUPIVACAINE-EPINEPHRINE 0.5% -1:200000 IJ SOLN
INTRAMUSCULAR | Status: DC | PRN
  Administered 2019-03-13: 10 mL
  Administered 2019-03-13: 20 mL

## 2019-03-13 MED ORDER — BUPIVACAINE LIPOSOME 1.3 % IJ SUSP
INTRAMUSCULAR | Status: AC
  Filled 2019-03-13: qty 20

## 2019-03-13 MED ORDER — PANTOPRAZOLE SODIUM 40 MG PO TBEC
40.0000 mg | DELAYED_RELEASE_TABLET | Freq: Every day | ORAL | Status: DC
  Administered 2019-03-14 – 2019-03-16 (×3): 40 mg via ORAL
  Filled 2019-03-13 (×3): qty 1

## 2019-03-13 MED ORDER — SODIUM CHLORIDE 0.9 % IV SOLN
INTRAVENOUS | Status: DC | PRN
  Administered 2019-03-13: .1 ug/kg/min via INTRAVENOUS

## 2019-03-13 MED ORDER — ACETAMINOPHEN 325 MG PO TABS: 650.0000 mg | ORAL_TABLET | ORAL | Status: DC | PRN

## 2019-03-13 MED ORDER — VANCOMYCIN HCL IN DEXTROSE 1-5 GM/200ML-% IV SOLN
1000.0000 mg | Freq: Once | INTRAVENOUS | Status: AC
  Administered 2019-03-13: 10:00:00 1000 mg via INTRAVENOUS

## 2019-03-13 MED ORDER — KETOROLAC TROMETHAMINE 30 MG/ML IJ SOLN
INTRAMUSCULAR | Status: AC
  Filled 2019-03-13: qty 1

## 2019-03-13 MED ORDER — LEVOTHYROXINE SODIUM 50 MCG PO TABS
150.0000 ug | ORAL_TABLET | Freq: Every day | ORAL | Status: DC
  Administered 2019-03-14 – 2019-03-16 (×3): 150 ug via ORAL
  Filled 2019-03-13 (×3): qty 1

## 2019-03-13 MED ORDER — SODIUM CHLORIDE 0.9% FLUSH
3.0000 mL | Freq: Two times a day (BID) | INTRAVENOUS | Status: DC
  Administered 2019-03-15 (×2): 3 mL via INTRAVENOUS

## 2019-03-13 MED ORDER — KETOROLAC TROMETHAMINE 30 MG/ML IJ SOLN
30.0000 mg | Freq: Once | INTRAMUSCULAR | Status: AC
  Administered 2019-03-13: 30 mg via INTRAVENOUS

## 2019-03-13 MED ORDER — MENTHOL 3 MG MT LOZG
1.0000 | LOZENGE | OROMUCOSAL | Status: DC | PRN
  Filled 2019-03-13: qty 9

## 2019-03-13 MED ORDER — SODIUM CHLORIDE 0.9 % IR SOLN
Status: DC | PRN
  Administered 2019-03-13: 1000 mL

## 2019-03-13 MED ORDER — SUCCINYLCHOLINE CHLORIDE 20 MG/ML IJ SOLN
INTRAMUSCULAR | Status: AC
  Filled 2019-03-13: qty 1

## 2019-03-13 MED ORDER — MONTELUKAST SODIUM 10 MG PO TABS
10.0000 mg | ORAL_TABLET | Freq: Every day | ORAL | Status: DC
  Administered 2019-03-14 – 2019-03-15 (×2): 10 mg via ORAL
  Filled 2019-03-13 (×2): qty 1

## 2019-03-13 MED ORDER — PROPOFOL 500 MG/50ML IV EMUL
INTRAVENOUS | Status: AC
  Filled 2019-03-13: qty 50

## 2019-03-13 MED ORDER — BUPIVACAINE HCL (PF) 0.5 % IJ SOLN
INTRAMUSCULAR | Status: AC
  Filled 2019-03-13: qty 30

## 2019-03-13 MED ORDER — HYDROMORPHONE HCL 1 MG/ML IJ SOLN
0.5000 mg | INTRAMUSCULAR | Status: DC | PRN
  Administered 2019-03-14 (×3): 0.5 mg via INTRAVENOUS
  Filled 2019-03-13 (×3): qty 1

## 2019-03-13 MED ORDER — SODIUM CHLORIDE 0.9% FLUSH: 3.0000 mL | INTRAVENOUS | Status: DC | PRN

## 2019-03-13 MED ORDER — CEFAZOLIN SODIUM-DEXTROSE 2-4 GM/100ML-% IV SOLN
INTRAVENOUS | Status: AC
  Filled 2019-03-13: qty 100

## 2019-03-13 MED ORDER — ONDANSETRON HCL 4 MG/2ML IJ SOLN
INTRAMUSCULAR | Status: DC | PRN
  Administered 2019-03-13: 4 mg via INTRAVENOUS

## 2019-03-13 MED ORDER — FENTANYL CITRATE (PF) 100 MCG/2ML IJ SOLN: 25.0000 ug | INTRAMUSCULAR | Status: DC | PRN

## 2019-03-13 MED ORDER — DEXAMETHASONE SODIUM PHOSPHATE 10 MG/ML IJ SOLN
INTRAMUSCULAR | Status: DC | PRN
  Administered 2019-03-13: 10 mg via INTRAVENOUS

## 2019-03-13 MED ORDER — ACETAMINOPHEN 650 MG RE SUPP: 650.0000 mg | RECTAL | Status: DC | PRN

## 2019-03-13 MED ORDER — THROMBIN 5000 UNITS EX SOLR
CUTANEOUS | Status: DC | PRN
  Administered 2019-03-13: 5000 [IU] via TOPICAL

## 2019-03-13 MED ORDER — FENTANYL CITRATE (PF) 100 MCG/2ML IJ SOLN
INTRAMUSCULAR | Status: AC
  Filled 2019-03-13: qty 2

## 2019-03-13 MED ORDER — FENTANYL CITRATE (PF) 100 MCG/2ML IJ SOLN
INTRAMUSCULAR | Status: DC | PRN
  Administered 2019-03-13 (×2): 50 ug via INTRAVENOUS

## 2019-03-13 MED ORDER — PROPOFOL 500 MG/50ML IV EMUL
INTRAVENOUS | Status: DC | PRN
  Administered 2019-03-13: 130 ug/kg/min via INTRAVENOUS

## 2019-03-13 MED ORDER — OXYCODONE HCL 5 MG PO TABS
10.0000 mg | ORAL_TABLET | ORAL | Status: DC | PRN
  Administered 2019-03-14 – 2019-03-16 (×8): 10 mg via ORAL
  Filled 2019-03-13 (×8): qty 2

## 2019-03-13 MED ORDER — KETOROLAC TROMETHAMINE 15 MG/ML IJ SOLN
15.0000 mg | Freq: Four times a day (QID) | INTRAMUSCULAR | Status: DC
  Administered 2019-03-14 – 2019-03-15 (×5): 15 mg via INTRAVENOUS
  Filled 2019-03-13 (×5): qty 1

## 2019-03-13 MED ORDER — SODIUM CHLORIDE 0.9 % IV SOLN
INTRAVENOUS | Status: DC | PRN
  Administered 2019-03-13: 20 ug/min via INTRAVENOUS

## 2019-03-13 MED ORDER — MAGNESIUM CITRATE PO SOLN
1.0000 | Freq: Once | ORAL | Status: DC | PRN
  Filled 2019-03-13: qty 296

## 2019-03-13 MED ORDER — SODIUM CHLORIDE FLUSH 0.9 % IV SOLN
INTRAVENOUS | Status: AC
  Filled 2019-03-13: qty 10

## 2019-03-13 MED ORDER — SUCCINYLCHOLINE CHLORIDE 20 MG/ML IJ SOLN
INTRAMUSCULAR | Status: DC | PRN
  Administered 2019-03-13: 100 mg via INTRAVENOUS

## 2019-03-13 MED ORDER — METHOCARBAMOL 1000 MG/10ML IJ SOLN
500.0000 mg | Freq: Four times a day (QID) | INTRAVENOUS | Status: DC
  Filled 2019-03-13: qty 5

## 2019-03-13 MED ORDER — ONDANSETRON HCL 4 MG PO TABS: 4.0000 mg | ORAL_TABLET | Freq: Four times a day (QID) | ORAL | Status: DC | PRN

## 2019-03-13 MED ORDER — OXYCODONE HCL 5 MG PO TABS
5.0000 mg | ORAL_TABLET | ORAL | Status: DC | PRN
  Administered 2019-03-13: 5 mg via ORAL
  Filled 2019-03-13: qty 1

## 2019-03-13 MED ORDER — THROMBIN 5000 UNITS EX SOLR
CUTANEOUS | Status: AC
  Filled 2019-03-13: qty 5000

## 2019-03-13 MED ORDER — PROPOFOL 10 MG/ML IV BOLUS
INTRAVENOUS | Status: AC
  Filled 2019-03-13: qty 20

## 2019-03-13 MED ORDER — LIDOCAINE HCL (PF) 2 % IJ SOLN
INTRAMUSCULAR | Status: AC
  Filled 2019-03-13: qty 10

## 2019-03-13 MED ORDER — ONDANSETRON HCL 4 MG/2ML IJ SOLN: 4.0000 mg | Freq: Four times a day (QID) | INTRAMUSCULAR | Status: DC | PRN

## 2019-03-13 MED ORDER — LOPERAMIDE HCL 2 MG PO CAPS
4.0000 mg | ORAL_CAPSULE | Freq: Three times a day (TID) | ORAL | Status: DC
  Administered 2019-03-14 – 2019-03-16 (×6): 4 mg via ORAL
  Filled 2019-03-13 (×9): qty 2

## 2019-03-13 MED ORDER — ACETAMINOPHEN 500 MG PO TABS
1000.0000 mg | ORAL_TABLET | Freq: Four times a day (QID) | ORAL | Status: AC
  Administered 2019-03-13 – 2019-03-14 (×3): 1000 mg via ORAL
  Filled 2019-03-13 (×3): qty 2

## 2019-03-13 MED ORDER — LACTATED RINGERS IV SOLN
INTRAVENOUS | Status: DC | PRN
  Administered 2019-03-13: 11:00:00 via INTRAVENOUS

## 2019-03-13 MED ORDER — PROPOFOL 10 MG/ML IV BOLUS
INTRAVENOUS | Status: AC
  Filled 2019-03-13: qty 40

## 2019-03-13 MED ORDER — KETAMINE HCL 50 MG/ML IJ SOLN
INTRAMUSCULAR | Status: DC | PRN
  Administered 2019-03-13: 50 mg via INTRAMUSCULAR

## 2019-03-13 MED ORDER — REMIFENTANIL HCL 1 MG IV SOLR
INTRAVENOUS | Status: AC
  Filled 2019-03-13: qty 1000

## 2019-03-13 MED ORDER — LORATADINE 10 MG PO TABS
10.0000 mg | ORAL_TABLET | Freq: Every day | ORAL | Status: DC
  Administered 2019-03-14 – 2019-03-16 (×3): 10 mg via ORAL
  Filled 2019-03-13 (×3): qty 1

## 2019-03-13 SURGICAL SUPPLY — 95 items
ADH SKN CLS APL DERMABOND .7 (GAUZE/BANDAGES/DRESSINGS) ×2
AGENT HMST MTR 8 SURGIFLO (HEMOSTASIS) ×1
APL PRP STRL LF DISP 70% ISPRP (MISCELLANEOUS) ×4
BONE VIVIGEN FORMABLE 5.4CC (Bone Implant) ×3 IMPLANT
BULB RESERV EVAC DRAIN JP 100C (MISCELLANEOUS) ×2 IMPLANT
BUR NEURO DRILL SOFT 3.0X3.8M (BURR) ×3 IMPLANT
CAGE MODULUS XL 8X18X50 - 10 (Cage) ×2 IMPLANT
CAGE MODULUS XLW 8X22X50 - 10 (Cage) ×2 IMPLANT
CANISTER SUCT 1200ML W/VALVE (MISCELLANEOUS) ×6 IMPLANT
CHLORAPREP W/TINT 26 (MISCELLANEOUS) ×12 IMPLANT
CORD BIP STRL DISP 12FT (MISCELLANEOUS) ×3 IMPLANT
COUNTER NEEDLE 20/40 LG (NEEDLE) ×3 IMPLANT
COVER LIGHT HANDLE STERIS (MISCELLANEOUS) ×12 IMPLANT
COVER WAND RF STERILE (DRAPES) ×3 IMPLANT
CRADLE LAMINECT ARM (MISCELLANEOUS) ×6 IMPLANT
CUP MEDICINE 2OZ PLAST GRAD ST (MISCELLANEOUS) ×3 IMPLANT
DERMABOND ADVANCED (GAUZE/BANDAGES/DRESSINGS) ×4
DERMABOND ADVANCED .7 DNX12 (GAUZE/BANDAGES/DRESSINGS) ×2 IMPLANT
DRAIN CHANNEL JP 10F RND 20C F (MISCELLANEOUS) ×2 IMPLANT
DRAPE C-ARM 42X72 X-RAY (DRAPES) ×8 IMPLANT
DRAPE C-ARMOR (DRAPES) ×6 IMPLANT
DRAPE INCISE IOBAN 66X45 STRL (DRAPES) ×6 IMPLANT
DRAPE LAPAROTOMY 100X77 ABD (DRAPES) ×6 IMPLANT
DRAPE MICROSCOPE SPINE 48X150 (DRAPES) ×3 IMPLANT
DRAPE POUCH INSTRU U-SHP 10X18 (DRAPES) ×3 IMPLANT
DRAPE SURG 17X11 SM STRL (DRAPES) ×24 IMPLANT
DRAPE TABLE BACK 80X90 (DRAPES) ×3 IMPLANT
DRSG OPSITE POSTOP 4X6 (GAUZE/BANDAGES/DRESSINGS) IMPLANT
DRSG TEGADERM 4X10 (GAUZE/BANDAGES/DRESSINGS) ×2 IMPLANT
DRSG TEGADERM 4X4.75 (GAUZE/BANDAGES/DRESSINGS) ×2 IMPLANT
ELECT CAUTERY BLADE TIP 2.5 (TIP) ×6
ELECT EZSTD 165MM 6.5IN (MISCELLANEOUS) ×3
ELECT REM PT RETURN 9FT ADLT (ELECTROSURGICAL) ×6
ELECTRODE CAUTERY BLDE TIP 2.5 (TIP) ×2 IMPLANT
ELECTRODE EZSTD 165MM 6.5IN (MISCELLANEOUS) ×1 IMPLANT
ELECTRODE REM PT RTRN 9FT ADLT (ELECTROSURGICAL) ×2 IMPLANT
FEE INTRAOP MONITOR IMPULS NCS (MISCELLANEOUS) IMPLANT
FRAME EYE SHIELD (PROTECTIVE WEAR) ×6 IMPLANT
GLOVE BIOGEL PI IND STRL 7.0 (GLOVE) ×2 IMPLANT
GLOVE BIOGEL PI INDICATOR 7.0 (GLOVE) ×4
GLOVE SURG SYN 7.0 (GLOVE) ×12 IMPLANT
GLOVE SURG SYN 7.0 PF PI (GLOVE) ×4 IMPLANT
GLOVE SURG SYN 8.5  E (GLOVE) ×12
GLOVE SURG SYN 8.5 E (GLOVE) ×6 IMPLANT
GLOVE SURG SYN 8.5 PF PI (GLOVE) ×6 IMPLANT
GOWN SRG XL LVL 3 NONREINFORCE (GOWNS) ×2 IMPLANT
GOWN STRL NON-REIN TWL XL LVL3 (GOWNS) ×6
GOWN STRL REUS W/TWL MED LVL3 (GOWN DISPOSABLE) ×6 IMPLANT
GRADUATE 1200CC STRL 31836 (MISCELLANEOUS) ×3 IMPLANT
GRAFT BNE MATRIX VG FRMBL MD 5 (Bone Implant) IMPLANT
GUIDEWIRE NITINOL BEVEL TIP (WIRE) ×20 IMPLANT
INTRAOP MONITOR FEE IMPULS NCS (MISCELLANEOUS)
INTRAOP MONITOR FEE IMPULSE (MISCELLANEOUS)
KIT DILATOR XLIF 5 (KITS) ×1 IMPLANT
KIT MAXCESS (KITS) ×2 IMPLANT
KIT NDL NVM5 EMG ELECT (KITS) IMPLANT
KIT NEEDLE NVM5 EMG ELECT (KITS) ×2 IMPLANT
KIT NEEDLE NVM5 EMG ELECTRODE (KITS) ×1
KIT SPINAL PRONEVIEW (KITS) ×3 IMPLANT
KIT SURGICAL ACCESS MAXCESS 4 (KITS) ×2 IMPLANT
KIT TURNOVER KIT A (KITS) ×3 IMPLANT
KIT XLIF (KITS) ×1
KNIFE BAYONET SHORT DISCETOMY (MISCELLANEOUS) IMPLANT
MARKER SKIN DUAL TIP RULER LAB (MISCELLANEOUS) ×9 IMPLANT
MODULUS XLW 10X22X50MM 10DEG (Spine Construct) ×2 IMPLANT
MODULUS XLW 10X22X50MM 15DEG (Spine Construct) ×2 IMPLANT
NDL I PASS (NEEDLE) IMPLANT
NDL SAFETY ECLIPSE 18X1.5 (NEEDLE) ×1 IMPLANT
NEEDLE HYPO 18GX1.5 SHARP (NEEDLE) ×6
NEEDLE HYPO 22GX1.5 SAFETY (NEEDLE) ×3 IMPLANT
NEEDLE I PASS (NEEDLE) ×3 IMPLANT
PACK LAMINECTOMY NEURO (CUSTOM PROCEDURE TRAY) ×3 IMPLANT
PAD ARMBOARD 7.5X6 YLW CONV (MISCELLANEOUS) ×3 IMPLANT
PAD TELFA 2X3 NADH STRL (GAUZE/BANDAGES/DRESSINGS) ×4 IMPLANT
PENCIL ELECTRO HAND CTR (MISCELLANEOUS) ×3 IMPLANT
PUTTY DBM PROPEL LRG (Putty) ×1 IMPLANT
PUTTY PROPEL LRG (Putty) ×1 IMPLANT
ROD RELINE LORDOTIC 5.5X140 (Rod) ×2 IMPLANT
ROD RELINE MAS LORD 5.5X130 (Rod) ×2 IMPLANT
SCREW LOCK RELINE 5.5 TULIP (Screw) ×20 IMPLANT
SCREW RELINE MAS RED 5.5X45MM (Screw) ×4 IMPLANT
SCREW RELINE RED 6.5X45MM POLY (Screw) ×16 IMPLANT
SPOGE SURGIFLO 8M (HEMOSTASIS) ×2
SPONGE SURGIFLO 8M (HEMOSTASIS) ×1 IMPLANT
SUT DVC VLOC 3-0 CL 6 P-12 (SUTURE) ×3 IMPLANT
SUT VIC AB 0 CT1 18XCR BRD 8 (SUTURE) IMPLANT
SUT VIC AB 0 CT1 27 (SUTURE) ×3
SUT VIC AB 0 CT1 27XCR 8 STRN (SUTURE) ×1 IMPLANT
SUT VIC AB 0 CT1 8-18 (SUTURE) ×3
SUT VIC AB 2-0 CT1 18 (SUTURE) ×5 IMPLANT
SYR 30ML LL (SYRINGE) ×6 IMPLANT
TOWEL OR 17X26 4PK STRL BLUE (TOWEL DISPOSABLE) ×9 IMPLANT
TRAY FOLEY MTR SLVR 16FR STAT (SET/KITS/TRAYS/PACK) IMPLANT
TUBING CONNECTING 10 (TUBING) ×6 IMPLANT
TUBING CONNECTING 10' (TUBING) ×3

## 2019-03-13 NOTE — Anesthesia Postprocedure Evaluation (Signed)
Anesthesia Post Note  Patient: Stephen Cervantes  Procedure(s) Performed: POSTERIOR LUMBAR FUSION 4 LEVEL L1-5 (N/A Back)  Patient location during evaluation: PACU Anesthesia Type: General Level of consciousness: awake and alert Pain management: pain level controlled Vital Signs Assessment: post-procedure vital signs reviewed and stable Respiratory status: spontaneous breathing and respiratory function stable Cardiovascular status: stable Anesthetic complications: no     Last Vitals:  Vitals:   03/13/19 1826 03/13/19 1828  BP:  118/78  Pulse: 71 69  Resp: (!) 27 (!) 24  Temp:    SpO2: 98% 96%    Last Pain:  Vitals:   03/13/19 1730  TempSrc:   PainSc: Asleep                 Jatinder Mcdonagh K

## 2019-03-13 NOTE — Progress Notes (Signed)
Notified [name], pt's contact, that he will be in his room in 10 minutes. Offered to give her his phone number in room and number to nurse station if she has any questions or wants patient update. She voiced appreciation but did not want the phone numbers. [name] appreciative of the phone call.

## 2019-03-13 NOTE — Progress Notes (Signed)
Pt opening eyes, moving arms and legs and answering questions appropriately but appears to continue to be groggy. Vital signs stable. 99% on 2L oxygen. Spoke with Dr. Ronelle Nigh to update. Per MD, pt may proceed to the floor. Will keep in PACU and continue to assess.  Notified Marin Olp, PA, of patient condition. No additional orders from [place]. She states she will update Dr. Cari Caraway.

## 2019-03-13 NOTE — Op Note (Signed)
Indications: Stephen Cervantes is a 78 yo male who presented with spondylolisthesis, acquired scoliosis and neurogenic claudication. He failed conservative management and elected for surgical intervention.    Findings: correction of deformity  Preoperative Diagnosis: Spondylolisthesis, acquired scoliosis, neurogenic claudication Postoperative Diagnosis: same   EBL: 200 ml IVF: 1300 ml Drains: 1 placed Disposition: Extubated and Stable to PACU Complications: none  No foley catheter was placed.   Preoperative Note:   Risks of surgery discussed include: infection, bleeding, stroke, coma, death, paralysis, CSF leak, nerve/spinal cord injury, numbness, tingling, weakness, complex regional pain syndrome, recurrent stenosis and/or disc herniation, vascular injury, development of instability, neck/back pain, need for further surgery, persistent symptoms, development of deformity, and the risks of anesthesia. The patient understood these risks and agreed to proceed.  NAME OF ANTERIOR PROCEDURE:               1. Anterior lumbar interbody fusion via a right lateral retroperitoneal approach at L1/2, L2/3, L3/4, L4/5 2. Placement of a Lordotic Modulus  8x18x50 interbody cage, filled with Demineralized Bone Matrix at L1/2 3. Placement of a Lordotic Modulus  10x22x50 interbody cage, filled with Demineralized Bone Matrix at L2/3 4. Placement of a Lordotic Modulus  8x22x50 interbody cage, filled with Demineralized Bone Matrix at L3/4 5. Placement of a Lordotic Modulus  10x22x50 interbody cage, filled with Demineralized Bone Matrix at L4/5  NAME OF POSTERIOR PROCEDURE: 1. Posterior instrumentation using   Nuvasive Reline Instrumentation 2. Posterolateral fusion, L1-5      PROCEDURE:  Patient was brought to the operating room, intubated, turned to the lateral position.  All pressure points were checked and double-checked.  The patient was prepped and draped in the standard fashion. Prior to prepping,  fluoroscopy was brought in and the patient was positioned with a large bump under the contralateral side between the iliac crest and rib cage, allowing the area between the iliac crest and the lateral aspect of the rib cage to open and increase the ability to reach inferiorly, to facilitate entry into the disc space.  The incision was marked upon the skin both the location of the disc space as well as the superior most aspect of the iliac crest.  Based on the identification of the disc space an incision was prepared, marked upon the skin and eventually was used for our lateral incision.  2 incisions were marked, one for L3-5 and one for L1-3. The fluoroscopy was turned into a cross table A/P image in order to confirm that the patient's spine remained in a perpendicular trajectory to the floor without rotation.  Once confirming that all the pressure points were checked and double-checked and the patient remained in sturdy position strapped down in this slightly jack-knifed lateral position, the patient was prepped and draped in standard fashion.  The skin was injected with local anesthetic, then incised until the abdominal wall fascia was noted.  I bluntly dissected posteriorly until we were able to identify the posterior musculature near petit's triangle.  At this point, using primarily blunt dissection with our finger aided with a metzenbaum scissor, were able to enter the retroperitoneal cavity.  The retroperitoneal potential space was opened further until palpating out the psoas muscle, the medial aspect of the iliac crest, the medial aspect of the last rib and continued to define the retroperitoneal space with blunt dissection in order to facilitate safe placement of our dilators.  Each incision was opened in the fashion and connected in the retroperitoneum.  While protecting by  dissecting directly onto a finger in the retroperitoneum, the retroperitoneal space was entered safely from the lateral incision  and the initial dilator placed onto the muscle belly of the psoas.  While directly stimulating the dilator and after radiographically confirming our location relative to the disc space, I placed the dilator through the psoas.  The dilators were stimulated to ensure remaining safely away from any of the lumbar plexus nerves; the dilators were repositioned until no pathologic stimulation was appreciated.  Once I had confirmed the location of our initial dilator radiographically, a K-wire secured the dilator into the L4/5 disc space and confirmed position under A/P and lateral fluoroscopy.  At this point, I dilated up with direct stimulation to confirm lack of pathologic stimulation.  Once all the dilators were in position, I placed in the retractor and secured it onto the table, locked into position and confirmed under A/P and lateral fluoroscopy to confirm our approach angle to the disc space as well as location relative to the disc space.  I then placed the muscle stimulator in through the working channel down to the vertebral body, stimulating the entire lateral surface of the vertebral body and any of the visualized psoas muscle that was adjacent to the retractor, confirming again the safe passage to the psoas before we began performing the discectomy.  At this point, we began our discectomy at L4/5.  The disc was incised laterally throughout the extent of our exposure. Using a combination of pituitary rongeurs, Kerrison rongeurs, rasps, curettes of various sorts, we were able to begin to clean out the disc space.  Once we had cleaned out the majority of the disc space, we then cut the lateral annulus with a cob, breaking the lateral annual attachments on the contralateral side by subtly working the cob through the annulus while using flouroscopy.  Care was taken not to extend further than required after cutting the annular attachments.  After this had been performed, we prepared the endplates for placement of  our graft, sized a graft to the disc space by serially dilating up in trial sizes until we confirmed that our graft would be well positioned, allowing distraction while maintaining good grip.  This was confirmed under A/P and lateral fluoroscopy in order to ensure its placement as an eventual trial for placement of our final graft.  We irrigated with bacteriostatic saline.  Once confirmed placement, the Modulus implant filled with allograft was impacted into position at L4/5.   Through a combination of intradiscal distraction and anterior releasing, we were able to correct the anterior deformity during disc preparation and placement of the graft.  After performing the lateral lumbar interbody procedure at L4/5, attention was moved to the L3/4 level.   While protecting by dissecting directly onto a finger in the retroperitoneum, the retroperitoneal space was entered safely from the lateral incision and the initial dilator placed onto the muscle belly of the psoas.  While directly stimulating the dilator and after radiographically confirming our location relative to the disc space, I placed the dilator through the psoas.  The dilators were stimulated to ensure remaining safely away from any of the lumbar plexus nerves; the dilators were repositioned until no pathologic stimulation was appreciated.  Once I had confirmed the location of our initial dilator radiographically, a K-wire secured the dilator into the L3/4 disc space and confirmed position under A/P and lateral fluoroscopy.  At this point, I dilated up with direct stimulation to confirm lack of pathologic stimulation.  Once all the dilators were in position, I placed in the retractor (while stimulating the posterior blade) and this was secured onto the table, locked into position and confirmed under A/P and lateral fluoroscopy to confirm our approach angle to the disc space as well as location relative to the disc space.  I then placed the muscle  stimulator in through the working channel down to the vertebral body, stimulating the entire lateral surface of the vertebral body and any of the visualized psoas muscle that was adjacent to the retractor, confirming again the safe passage to the psoas before we began performing the discectomy.  At this point, we began our discectomy at L3/4.  Through a combination of intradiscal distraction and anterior releasing, we were able to correct the anterior deformity.  The disc was incised laterally throughout the extent of our exposure. Using a combination of pituitary rongeurs, Kerrison rongeurs, rasps, curettes of various sorts, we were able to begin to clean out the disc space.  Once we had cleaned out the majority of the disc space, we then cut the lateral annulus with a cob, breaking the lateral annual attachments on the contralateral side by subtly working the cob through the annulus while using flouroscopy.  Care was taken not to extend further than required after cutting the annular attachments.  After this had been performed, we prepared the endplates for placement of our graft, sized a graft to the disc space by serially dilating up in trial sizes until we confirmed that our graft would be well positioned, allowing distraction while maintaining good grip.  This was confirmed under A/P and lateral fluoroscopy in order to ensure its placement as an eventual trial for placement of our final graft.  We irrigated with bacteriostatic saline.  Once confirmed placement, the Modulus graft was impacted into position at L3/4.          After performing the lateral lumbar interbody procedure at L3/4, attention was moved to the L1/2 level.   While protecting by dissecting directly onto a finger in the retroperitoneum, the retroperitoneal space was entered safely from the lateral incision and the initial dilator placed onto the muscle belly of the psoas.  While directly stimulating the dilator and after  radiographically confirming our location relative to the disc space, I placed the dilator through the psoas.  The dilators were stimulated to ensure remaining safely away from any of the lumbar plexus nerves; the dilators were repositioned until no pathologic stimulation was appreciated.  Once I had confirmed the location of our initial dilator radiographically, a K-wire secured the dilator into the L1/2 disc space and confirmed position under A/P and lateral fluoroscopy.  At this point, I dilated up with direct stimulation to confirm lack of pathologic stimulation.  Once all the dilators were in position, I placed in the retractor (while stimulating the posterior blade) and this was secured onto the table, locked into position and confirmed under A/P and lateral fluoroscopy to confirm our approach angle to the disc space as well as location relative to the disc space.  I then placed the muscle stimulator in through the working channel down to the vertebral body, stimulating the entire lateral surface of the vertebral body and any of the visualized psoas muscle that was adjacent to the retractor, confirming again the safe passage to the psoas before we began performing the discectomy.  At this point, we began our discectomy at L1/2.  Through a combination of intradiscal distraction and anterior releasing, we  were able to correct the anterior deformity.  The disc was incised laterally throughout the extent of our exposure. Using a combination of pituitary rongeurs, Kerrison rongeurs, rasps, curettes of various sorts, we were able to begin to clean out the disc space.  Once we had cleaned out the majority of the disc space, we then cut the lateral annulus with a cob, breaking the lateral annual attachments on the contralateral side by subtly working the cob through the annulus while using flouroscopy.  Care was taken not to extend further than required after cutting the annular attachments.  After this had been  performed, we prepared the endplates for placement of our graft, sized a graft to the disc space by serially dilating up in trial sizes until we confirmed that our graft would be well positioned, allowing distraction while maintaining good grip.  This was confirmed under A/P and lateral fluoroscopy in order to ensure its placement as an eventual trial for placement of our final graft.  We irrigated with bacteriostatic saline.  Once confirmed placement, the Modulus graft was impacted into position at L1/2.    After performing the lateral lumbar interbody procedure at L1/2, attention was moved to the L2/3 level.   While protecting by dissecting directly onto a finger in the retroperitoneum, the retroperitoneal space was entered safely from the lateral incision and the initial dilator placed onto the muscle belly of the psoas.  While directly stimulating the dilator and after radiographically confirming our location relative to the disc space, I placed the dilator through the psoas.  The dilators were stimulated to ensure remaining safely away from any of the lumbar plexus nerves; the dilators were repositioned until no pathologic stimulation was appreciated.  Once I had confirmed the location of our initial dilator radiographically, a K-wire secured the dilator into the L2/3 disc space and confirmed position under A/P and lateral fluoroscopy.  At this point, I dilated up with direct stimulation to confirm lack of pathologic stimulation.  Once all the dilators were in position, I placed in the retractor (while stimulating the posterior blade) and this was secured onto the table, locked into position and confirmed under A/P and lateral fluoroscopy to confirm our approach angle to the disc space as well as location relative to the disc space.  I then placed the muscle stimulator in through the working channel down to the vertebral body, stimulating the entire lateral surface of the vertebral body and any of the  visualized psoas muscle that was adjacent to the retractor, confirming again the safe passage to the psoas before we began performing the discectomy.  At this point, we began our discectomy at L2/3.  Through a combination of intradiscal distraction and anterior releasing, we were able to correct the anterior deformity.  The disc was incised laterally throughout the extent of our exposure. Using a combination of pituitary rongeurs, Kerrison rongeurs, rasps, curettes of various sorts, we were able to begin to clean out the disc space.  Once we had cleaned out the majority of the disc space, we then cut the lateral annulus with a cob, breaking the lateral annual attachments on the contralateral side by subtly working the cob through the annulus while using flouroscopy.  Care was taken not to extend further than required after cutting the annular attachments.  After this had been performed, we prepared the endplates for placement of our graft, sized a graft to the disc space by serially dilating up in trial sizes until we confirmed that  our graft would be well positioned, allowing distraction while maintaining good grip.  This was confirmed under A/P and lateral fluoroscopy in order to ensure its placement as an eventual trial for placement of our final graft.  We irrigated with bacteriostatic saline.  Once confirmed placement, the Modulus graft was impacted into position at L2/3.              At this point, final radiographs were performed, and we began closure.  The wound was closed using 0 Vicryl interrupted suture in the fascia and 2-0 Vicryl inverted suture were placed in the subcutaneous tissue and dermis. 3-0 monocryl was used for final closure. Dermabond was used to close the skin.     After closing the anterior part in layers, the patient was repositioned into prone position.  All pressure points were checked and double-checked and we brought in fluoroscopy to confirm our approach angles for  putting in percutaneous pedicle screws.  The pedicles were marked using true AP flouroscopy, adjusting the angle at each level.  We then prepped and draped the patient in the standard fashion.  At this point, incisions were made for placing percutaneous pedicle screw instrumentation at L1-5.  Starting at L1, a Jamsheedi needle was used to cannulate the pedicle bilaterally using AP flouroscopy. Direct stimulation was used on the needle without any low (<15 mAmp) stimulation thresholds. After cannulation of the pedicle to 30 mm, a K-wire was placed through the [place] approximately and secured.   Using a similar technique, the pedicles at L1-L5 were cannulated and K wires secured. The K wires were then checked using lateral flouroscopy to ensure placement into the vertebral bodies. After confirming placement of K wires, cannulated pedicle screws were introduced over the K wires at each level.  After advancing each screw into the vertebral body approximately 25-30 mm, the K wire was removed.At L2-5, 6.5x11mm Nuvasive Reline pedicle screws were placed under lateral flouroscopy. At L1, 5.5x45 mm screws were placed. Once the screws were placed, the screw extensions were then linked, a path was formed for the rod and a rod was utilized to connect the screws.  We then compressed, torqued / counter-torqued and removed the screw assembly. Once performed on each side, confirmatory AP and lateral x-rays were taken and the case was completed.   Again we confirmed radiographically and began our closure.  The wound was closed using 0 Vicryl interrupted suture in the fascia, 2-0 Vicryl inverted suture were placed in the subcutaneous tissue and dermis. Staples were used for final closure.   Needle, lap and all counts were correct at the end of the case.    Marin Olp PA assisted in the entire procedure.  Meade Maw MD Neurosurgery

## 2019-03-13 NOTE — Anesthesia Preprocedure Evaluation (Signed)
Anesthesia Evaluation  Patient identified by MRN, date of birth, ID band Patient awake    Reviewed: Allergy & Precautions, H&P , NPO status , Patient's Chart, lab work & pertinent test results, reviewed documented beta blocker date and time   History of Anesthesia Complications Negative for: history of anesthetic complications  Airway Mallampati: I  TM Distance: >3 FB Neck ROM: full    Dental  (+) Dental Advidsory Given, Caps Permanent bridge:   Pulmonary neg shortness of breath, asthma , neg COPD, neg recent URI, former smoker,           Cardiovascular Exercise Tolerance: Good hypertension, (-) angina(-) Past MI (-) dysrhythmias (-) Valvular Problems/Murmurs     Neuro/Psych PSYCHIATRIC DISORDERS Depression negative neurological ROS     GI/Hepatic negative GI ROS, Neg liver ROS,   Endo/Other  neg diabetesHypothyroidism   Renal/GU Renal disease (kidney stones)  negative genitourinary   Musculoskeletal   Abdominal   Peds  Hematology negative hematology ROS (+)   Anesthesia Other Findings Past Medical History: No date: Arthritis No date: Asthma 2011,2015: Bowel obstruction ([place]) No date: Depression 2006: Diverticulitis No date: Enlarged prostate 07/04/2014: Gallstones No date: History of gastrointestinal ulcer No date: History of kidney stones No date: HLD (hyperlipidemia) No date: HTN (hypertension) No date: Kidney stone     Comment:  kidney stones 05/29/2015: Neuritis or radiculitis due to rupture of lumbar  intervertebral disc No date: Thyroid disease   Reproductive/Obstetrics negative OB ROS                             Anesthesia Physical Anesthesia Plan  ASA: II  Anesthesia Plan: General   Post-op Pain Management:    Induction: Intravenous  PONV Risk Score and Plan: Propofol infusion, TIVA, Ondansetron, Dexamethasone and Treatment may vary due to age or medical  condition  Airway Management Planned: Oral ETT  Additional Equipment:   Intra-op Plan:   Post-operative Plan: Extubation in OR  Informed Consent: I have reviewed the patients History and Physical, chart, labs and discussed the procedure including the risks, benefits and alternatives for the proposed anesthesia with the patient or authorized representative who has indicated his/her understanding and acceptance.     Dental Advisory Given  Plan Discussed with: Anesthesiologist, CRNA and Surgeon  Anesthesia Plan Comments:         Anesthesia Quick Evaluation

## 2019-03-13 NOTE — H&P (Signed)
History of Present Illness: 03/13/2019 [name] presents with continued back pain and leg pain.  He would like to move forward with surgical intervention.  01/10/2019  [name] has tried physical therapy for his back. He has not had any improvements. He is here to discuss surgical intervention.  11/22/2018 [name] is here today with a chief complaint of back pain. This is reviewed below. He has pain in the right side of his back that worsens the longer he is standing or walking. He is also noticed some changes in his posture and is now talking in his pelvis more than he was previously. His wife is noticed that he leans on countertops when he comes in particular rooms. He also uses the shopping cart whenever he is in a store to help support him. He describes pain that goes into the right side of his groin and then can extend further down his leg longer he stands up. He has tried extensive conservative management including injections as listed below. He is about to start physical therapy.  11/01/2018 from [place] visit: Since his last visit, he has received multiple injections with Dr. Sharlet Salina that sometimes provide relief and sometimes provide minimal relief. Only has complaints of right low lumbar pain that can radiate into the buttock/hip. Complains of inability to walk any significant length of distance without having to stop and rest. On further evaluation this is mainly limited to back pain but he does feel that upper part of his legs can get achy. When walking also states fatigue and lower extremities. Complains of cramping but feels this may be due to dehydration. Denies any lower extremity weakness. Works out on a regular basis at [employer].  Denies bladder/bowel dysfunction, saddle paresthesia, lower extremity shooting pain/numbness/tingling. Still has not attempted physical therapy  Procedures: 10/04/2018: Right L3-4 transforaminal ESI 06/26/2018: Right L3-4 transforaminal  ESI(moderate to good relief) 10/20/2017: Right L3-4 transforaminalESI(good relief) 06/15/2017: Right L3-4 transforaminalESI 05/21/2017: Right L3-4 transforaminalESI 06/26/2015: Right L3-4 transforaminalESI(good relief) 05/29/2015: Right L3-4 transforaminalESI  05/23/2017 Stephen Cervantes is a 78 y.o. male with history of DDD and scoliosis who presents with the chief complaint of chronic back pain and right hip pain. Unable to walk more than 100 yards before pain starts.  Duration: few years Location: Back: right low lumbar, right hip Quality: aching Severity: at worse 2-4/10  Precipitating: aggravated by exercise, standing, walking Modifying factors: made better by laying down, rest, hot showers Weakness: yes in right leg, no recent falls.  Timing: intermittent Bowel/Bladder Dysfunction: none. Denies saddle paresthesia  Conservative measures:  Physical therapy: has not received  Multimodal medical therapy including regular antiinflammatories: Ibuprofen, gym Injections: Received epidural steroid injections in 2016 with Dr. Sharlet Salina with benefit after 2nd injection. Has received another injection 2 weeks ago.   Past Spinal Surgery: denies  Review of Systems:  A 10 point review of systems is negative, except for the pertinent positives and negatives detailed in the HPI.  Past Medical History: Past Medical History:  Diagnosis Date  . Arthritis  . Colonic disease  specifics unclear  . Epigastric pain 05/22/2017  . History of duodenal ulcer 05/22/2017  . Hyperlipidemia  . Hypertension  . Hypothyroidism  . Kidney stones  . Seasonal allergies  . Thyroid disease   Past Surgical History: Past Surgical History:  Procedure Laterality Date  . Colectomy  . Hernia repair  . JOINT REPLACEMENT  . Kidney stone surgery  . Partial medial and lateral meniscectomy 01/01/2010  .  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 Morphine Hallucination 03/28/2014  . Other Other (See Comments) 03/28/2014   Medications: Outpatient Encounter Medications as of 01/10/2019  Medication Sig Dispense Refill  . atorvastatin (LIPITOR) 10 MG tablet Take 10 mg by mouth once daily.  . calcium carbonate-vitamin D3 (OS-CAL 500+D) 500 mg(1,250mg ) -200 unit tablet Take 1 tablet by mouth once daily.  . celecoxib (CELEBREX) 200 MG capsule Take 1 capsule by mouth once daily  . cyanocobalamin (VITAMIN B-12) 2000 MCG tablet Take 2,000 mcg by mouth once daily.  . hydrochlorothiazide (HYDRODIURIL) 25 MG tablet Take 1 tablet by mouth once a day [fluid]  . levothyroxine (SYNTHROID, LEVOTHROID) 137 MCG tablet Take 137 mcg by mouth once daily. Take on an empty stomach with a glass of water at least 30-60 minutes before breakfast.   . loperamide (IMODIUM A-D) 2 mg tablet Take 2 mg by mouth 3 (three) times daily as needed for Diarrhea  . loratadine (CLARITIN) 10 mg tablet Take 10 mg by mouth once daily.  Marland Kitchen lutein 20 mg Tab Take by mouth.  . montelukast (SINGULAIR) 10 mg tablet once a day  . multivitamin (MULTIVITAMIN) tablet Administer 1 tablet by mouth once a day  . omega-3 fatty acids-fish oil 360-1,200 mg Cap Take 1 capsule by mouth once daily.   . pantoprazole (PROTONIX) 40 MG DR tablet Take 1 tablet by mouth once daily  . potassium citrate (UROCIT-K) 10 mEq ER tablet Administer 1 tablet by mouth four times a day  . sucralfate (CARAFATE) 1 gram tablet Take 1 g by mouth 2 (two) times daily before meals.   No facility-administered encounter medications on file as of 01/10/2019.   Social History: Social History   Tobacco Use  . Smoking status: Never Smoker  . Smokeless tobacco: Never Used  . Tobacco comment: Does not smoke.  Substance Use Topics  . Alcohol use: No  Alcohol/week: 0.0 standard drinks  . Drug use: No   Family Medical History: Family History  Problem Relation Age of Onset  .  Prostate cancer Father  . High blood pressure (Hypertension) Father  . Cancer Father  . Kidney failure Mother  . Alzheimer's disease Mother  . Migraines Mother  . Cancer Brother  . Cancer Maternal Grandfather   Physical Examination:  Vitals:   03/13/19 0832  BP: (!) 147/78  Pulse: 65  Resp: 18  Temp: 97.8 F (36.6 C)  SpO2: 97%     General: Patient is well developed, well nourished, calm, collected, and in no apparent distress. Attention to examination is appropriate.  Psychiatric: Patient is non-anxious.  Head: Pupils equal, round, and reactive to light.  ENT: Oral mucosa appears well hydrated.  Neck: Supple. Full range of motion.  Respiratory: Patient is breathing without any difficulty.  Extremities: No edema.  Vascular: Palpable dorsal pedal pulses.  Skin: On exposed skin, there are no abnormal skin lesions.  Heart sounds normal no MRG. Chest Clear to Auscultation Bilaterally.   NEUROLOGICAL:   Awake, alert, oriented to person, place, and time. Speech is clear and fluent. Fund of knowledge is appropriate.   Cranial Nerves: Pupils equal round and reactive to light. Facial tone is symmetric. Facial sensation is symmetric. Shoulder shrug is symmetric. Tongue protrusion is midline. There is no pronator drift.  ROM of spine: full but with altered pelvic alignment. Palpation of spine: non tender.   Strength: Side Biceps Triceps Deltoid  Interossei Grip Wrist Ext. Wrist Flex.  R 5 5 5 5 5 5 5   L 5 5 5 5 5 5 5    Side Iliopsoas Quads Hamstring PF DF EHL  R 5 5 5 5 5 5   L 5 5 5 5 5 5    Reflexes are 1+ and symmetric at the biceps, triceps, brachioradialis, patella and achilles. Hoffman's is absent. Clonus is not present. Toes are down-going.  Bilateral upper and lower extremity sensation is intact to light touch.  Gait is antalgic  Rapid alternating movements are normal.   Medical Decision Making  Imaging: MRI L spine 11/12/2018  IMPRESSION: 1. No acute  osseous process. Similar grade 1 L4-5 anterolisthesis without spondylolysis. 2. Moderate canal stenosis L1-2, L3-4 and L4-5. Lateral recess narrowing may affect the traversing RIGHT L3 through RIGHT L5 nerves. 3. Neural foraminal narrowing L1-2 through L5-S1: Severe on the RIGHT at L3-4 and L4-5.  Electronically Signed By: Elon Alas M.D. On: 11/12/2018 01:31  I have personally reviewed the images and agree with the above interpretation.  Assessment and Plan: Mr. Strebeck is a pleasant 78 y.o. 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 He has some lateral listhesis and anterolisthesis as well, leading to neurogenic claudication.   I recommended considering L1-5 lateral lumbar interbody fusion with percutaneous fixation.   We will move forward with surgery today.  Denese Mentink K. Izora Ribas MD, [place] [facility]

## 2019-03-13 NOTE — Progress Notes (Signed)
Reported off to Neoma Laming, Therapist, sports, on 1A. She is aware that patient was very groggy in PACU and should proceed with caution when giving pain medication.

## 2019-03-13 NOTE — Progress Notes (Signed)
Updated Marin Olp, PA, via phone. Scant amount of blood on back bandage. Area marked. Estill Bamberg verbalized understanding with nothing else needed at this time.

## 2019-03-13 NOTE — Anesthesia Procedure Notes (Signed)
Procedure Name: Intubation Date/Time: 03/13/2019 11:12 AM Performed by: Eben Burow, CRNA Pre-anesthesia Checklist: Patient identified, Emergency Drugs available, Suction available and Patient being monitored Patient Re-evaluated:Patient Re-evaluated prior to induction Oxygen Delivery Method: Circle system utilized Preoxygenation: Pre-oxygenation with 100% oxygen Induction Type: IV induction Ventilation: Mask ventilation without difficulty Laryngoscope Size: Mac and 4 Grade View: Grade I Tube type: Oral Tube size: 8.0 mm Number of attempts: 1 Airway Equipment and Method: Stylet and LTA kit utilized Placement Confirmation: ETT inserted through vocal cords under direct vision,  positive ETCO2 and breath sounds checked- equal and bilateral Secured at: 23 cm Tube secured with: Tape Dental Injury: Teeth and Oropharynx as per pre-operative assessment

## 2019-03-13 NOTE — Anesthesia Post-op Follow-up Note (Signed)
Anesthesia QCDR form completed.        

## 2019-03-13 NOTE — Transfer of Care (Signed)
Immediate Anesthesia Transfer of Care Note  Patient: Stephen Cervantes  Procedure(s) Performed: POSTERIOR LUMBAR FUSION 4 LEVEL L1-5 (N/A Back)  Patient Location: PACU  Anesthesia Type:General  Level of Consciousness: sedated  Airway & Oxygen Therapy: Patient Spontanous Breathing and Patient connected to face mask oxygen  Post-op Assessment: Report given to RN and Post -op Vital signs reviewed and stable  Post vital signs: Reviewed and stable  Last Vitals:  Vitals Value Taken Time  BP 88/56 03/13/19 1732  Temp    Pulse 73 03/13/19 1733  Resp 19 03/13/19 1733  SpO2 92 % 03/13/19 1733  Vitals shown include unvalidated device data.  Last Pain:  Vitals:   03/13/19 1715  TempSrc:   PainSc: (P) Asleep         Complications: No apparent anesthesia complications

## 2019-03-14 ENCOUNTER — Inpatient Hospital Stay: Payer: Medicare Other

## 2019-03-14 ENCOUNTER — Encounter: Payer: Self-pay | Admitting: Neurosurgery

## 2019-03-14 MED ORDER — DIAZEPAM 5 MG PO TABS
5.0000 mg | ORAL_TABLET | Freq: Once | ORAL | Status: AC
  Administered 2019-03-14: 22:00:00 5 mg via ORAL
  Filled 2019-03-14: qty 1

## 2019-03-14 MED ORDER — METHOCARBAMOL 1000 MG/10ML IJ SOLN
500.0000 mg | Freq: Four times a day (QID) | INTRAVENOUS | Status: DC
  Filled 2019-03-14: qty 5

## 2019-03-14 MED ORDER — METHOCARBAMOL 500 MG PO TABS
1000.0000 mg | ORAL_TABLET | Freq: Four times a day (QID) | ORAL | Status: DC
  Administered 2019-03-14 – 2019-03-16 (×8): 1000 mg via ORAL
  Filled 2019-03-14 (×8): qty 2

## 2019-03-14 NOTE — Evaluation (Signed)
Occupational Therapy Evaluation Patient Details Name: Stephen Cervantes MRN: [id number] DOB: 08/02/1941 Today's Date: 03/14/2019    History of Present Illness 78 y/o male s/p L1-5 fusion 03/13/19.   Clinical Impression   Pt seen for OT evaluation this date, POD#1 from lumbar fusion. Prior to hospital admission, pt was independent with mobility, ADL, and IADL, but limited 2/2 back pain. No falls in past 12 months. Pt lives with spouse in a single family home. Currently pt is at [facility] for functional transfers, Min A for LB ADL tasks. Pt educated in back precautions with handout provided, self care skills, bed mobility and functional transfer training, AE/DME for bathing, dressing, and toileting needs, and home/routines modifications and falls prevention strategies including pet care considerations to maximize safety and functional independence while minimizing falls risk and maintaining precautions. Pt verbalized understanding of all education/training provided. Handout provided to support recall and carry over of learned precautions/techniques for bed mobility, functional transfers, and self care skills. Pt will benefit from additional OT while in the hospital to maximize carryover of learned techniques for ADL while incorporating back precautions. Upon hospital discharge, pt safe to discharge home.    Follow Up Recommendations  No OT follow up    Equipment Recommendations  3 in 1 bedside commode    Recommendations for Other Services       Precautions / Restrictions Precautions Precautions: Fall;Back Precaution Booklet Issued: Yes (comment) Required Braces or Orthoses: Spinal Brace Spinal Brace: Applied in sitting position Restrictions Weight Bearing Restrictions: No      Mobility Bed Mobility Overal bed mobility: Independent                Transfers Overall transfer level: Needs assistance Equipment used: Rolling walker (2 wheeled) Transfers: Sit to/from Stand Sit to  Stand: Min assist              Balance Overall balance assessment: Modified Independent                                         ADL either performed or assessed with clinical judgement   ADL Overall ADL's : Needs assistance/impaired                                       General ADL Comments: CGA for amb, Min A for functional ADL transfers, Min A for LB ADL, cues for back precautions     Vision Baseline Vision/History: Wears glasses Wears Glasses: Reading only Patient Visual Report: No change from baseline Vision Assessment?: No apparent visual deficits     Perception     Praxis      Pertinent Vitals/Pain Pain Assessment: 0-10 Pain Score: 3  Pain Location: low back pain Pain Descriptors / Indicators: Aching Pain Intervention(s): Limited activity within patient's tolerance;Monitored during session;Repositioned     Hand Dominance     Extremity/Trunk Assessment Upper Extremity Assessment Upper Extremity Assessment: Generalized weakness   Lower Extremity Assessment Lower Extremity Assessment: Overall WFL for tasks assessed;Generalized weakness;Defer to PT evaluation       Communication Communication Communication: No difficulties   Cognition Arousal/Alertness: Awake/alert Behavior During Therapy: WFL for tasks assessed/performed Overall Cognitive Status: Within Functional Limits for tasks assessed  General Comments       Exercises Other Exercises Other Exercises: Pt instructed in back precautions including a handout to support recall and carryover while performing ADL, IADL, and functional mobility   Shoulder Instructions      Home Living Family/patient expects to be discharged to:: Private residence Living Arrangements: Spouse/significant other Available Help at Discharge: Family Type of Home: House Home Access: Ramped entrance               [place]:  [place]: None          Prior Functioning/Environment Level of Independence: Independent        Comments: Pt has been limited with out of home activity recently, able to manage in the home         OT Problem List: Decreased strength;Decreased range of motion;Pain;Decreased knowledge of use of DME or AE;Decreased knowledge of precautions      OT Treatment/Interventions: Self-care/ADL training;Therapeutic exercise;Therapeutic activities;DME and/or AE instruction;Patient/family education    OT Goals(Current goals can be found in the care plan section) Acute Rehab OT Goals Patient Stated Goal: Go home OT Goal Formulation: With patient Time For Goal Achievement: 03/28/19 Potential to Achieve Goals: Good ADL Goals Pt Will Perform Lower Body Dressing: with supervision;sit to/from stand(AE as needed, maintaining back precautions) Pt Will Transfer to Toilet: with supervision;ambulating(LRAD for amb, maintaining back precautions, comfort height) Additional ADL Goal #1: Pt will independently instruct family in compression stocking mgt Additional ADL Goal #2: Pt will independently verbalize back precautions and how to implement during ADL  OT Frequency: Min 1X/week   Barriers to D/C:            Co-evaluation              AM-PAC OT "6 Clicks" Daily Activity     Outcome Measure Help from another person eating meals?: None Help from another person taking care of personal grooming?: None Help from another person toileting, which includes using toliet, bedpan, or urinal?: A Little Help from another person bathing (including washing, rinsing, drying)?: A Little Help from another person to put on and taking off regular upper body clothing?: None Help from another person to put on and taking off regular lower body clothing?: A Little 6 Click Score: 21   End of Session    Activity Tolerance: Patient tolerated treatment well Patient left: in bed;with call  bell/phone within reach;with bed alarm set  OT Visit Diagnosis: Other abnormalities of gait and mobility (R26.89);Pain;Muscle weakness (generalized) (M62.81) Pain - Right/Left: (low back)                Time: 5456-2563 OT Time Calculation (min): 24 min Charges:  OT General Charges $OT Visit: 1 Visit OT Evaluation $OT Eval Low Complexity: 1 Low OT Treatments $Self Care/Home Management : 8-22 mins  Jeni Salles, MPH, MS, OTR/L ascom [phone] 03/14/19, 4:34 PM

## 2019-03-14 NOTE — Evaluation (Signed)
Physical Therapy Evaluation Patient Details Name: Stephen Cervantes MRN: [id number] DOB: [birth year] Today's Date: 03/14/2019   History of Present Illness  78 y/o male s/p L1-5 fusion 03/13/19.  Clinical Impression  Pt is able to perform bed mobility relatively well with good awareness of back protection and positioning.  He was able to rise to sitting w/o direct assist and needed only light assistance with getting to standing, able to ambulate with light RW use to/from the hallway.  Pt c/o moderate pain at rest that increases with activity and caused consistent guarding but was able to manage most mobility tasks w/o excessive assist or cuing required.   Follow Up Recommendations Home health PT;Follow surgeon's recommendation for DC plan and follow-up therapies    Equipment Recommendations  Rolling walker with 5" wheels    Recommendations for Other Services       Precautions / Restrictions Precautions Precautions: Fall;Back Precaution Booklet Issued: Yes (comment) Required Braces or Orthoses: Spinal Brace Spinal Brace: Applied in sitting position Restrictions Weight Bearing Restrictions: No      Mobility  Bed Mobility Overal bed mobility: Independent             General bed mobility comments: Pt showed good awareness of neutral spine and was able to log roll with only cuing and no direct assist  Transfers Overall transfer level: Needs assistance Equipment used: Rolling walker (2 wheeled)(LSO donned) Transfers: Sit to/from Stand Sit to Stand: Min assist         General transfer comment: Pt struggled to rise from low setting of bed, needed light assist to gain fully upright position.  Able to maintain balance with only light use of RW once standing  Ambulation/Gait Ambulation/Gait assistance: Min guard Gait Distance (Feet): 45 Feet Assistive device: Rolling walker (2 wheeled)       General Gait Details: Pt was able to ambulate with only light use of walker,  generally with guarded, slow cadence.  He showed good effort despite pain and did not have any LOBs though he did not feel confident the entire time  Stairs            Wheelchair Mobility    Modified Rankin (Stroke Patients Only)       Balance Overall balance assessment: Modified Independent                                           Pertinent Vitals/Pain Pain Assessment: 0-10 Pain Score: 5  Pain Location: pain increases with any activity    Home Living Family/patient expects to be discharged to:: Private residence Living Arrangements: Spouse/significant other   Type of Home: House Home Access: Ramped entrance       Home Equipment: None      Prior Function Level of Independence: Independent         Comments: Pt has been limited with out of home activity recently, able to manage in the home      Hand Dominance        Extremity/Trunk Assessment   Upper Extremity Assessment Upper Extremity Assessment: Generalized weakness;Overall [facility] for tasks assessed    Lower Extremity Assessment Lower Extremity Assessment: Overall WFL for tasks assessed;Generalized weakness(pain limited, but WFL strength t/o)       Communication   Communication: No difficulties  Cognition Arousal/Alertness: Awake/alert Behavior During Therapy: WFL for tasks assessed/performed Overall Cognitive Status: Within Functional Limits  for tasks assessed                                        General Comments General comments (skin integrity, edema, etc.): Pt on room air t/o the session, O2 remained in the mid 90s at rest and during activity    Exercises     Assessment/Plan    PT Assessment Patient needs continued PT services  PT Problem List Decreased strength;Decreased range of motion;Decreased activity tolerance;Decreased balance;Decreased mobility;Decreased coordination;Decreased knowledge of use of DME;Decreased safety awareness;Decreased  knowledge of precautions;Pain       PT Treatment Interventions Functional mobility training;Therapeutic activities;DME instruction;Gait training;Therapeutic exercise;Balance training;Neuromuscular re-education;Patient/family education    PT Goals (Current goals can be found in the Care Plan section)  Acute Rehab PT Goals Patient Stated Goal: Go home PT Goal Formulation: With patient Time For Goal Achievement: 03/28/19 Potential to Achieve Goals: Good    Frequency 7X/week   Barriers to discharge        Co-evaluation               AM-PAC PT "6 Clicks" Mobility  Outcome Measure Help needed turning from your back to your side while in a flat bed without using bedrails?: A Little Help needed moving from lying on your back to sitting on the side of a flat bed without using bedrails?: A Little Help needed moving to and from a bed to a chair (including a wheelchair)?: A Little Help needed standing up from a chair using your arms (e.g., wheelchair or bedside chair)?: A Little Help needed to walk in hospital room?: A Lot Help needed climbing 3-5 steps with a railing? : A Lot 6 Click Score: 16    End of Session Equipment Utilized During Treatment: Gait belt;Back brace Activity Tolerance: Patient tolerated treatment well;Patient limited by pain Patient left: with chair alarm set;with call bell/phone within reach Nurse Communication: Mobility status PT Visit Diagnosis: Muscle weakness (generalized) (M62.81);Difficulty in walking, not elsewhere classified (R26.2);Pain;Other abnormalities of gait and mobility (R26.89) Pain - part of body: (lumbago)    Time: 3817-7116 PT Time Calculation (min) (ACUTE ONLY): 32 min   Charges:   PT Evaluation $PT Eval Low Complexity: 1 Low PT Treatments $Gait Training: 8-22 mins        Kreg Shropshire, DPT 03/14/2019, 10:44 AM

## 2019-03-15 MED ORDER — ENOXAPARIN SODIUM 40 MG/0.4ML ~~LOC~~ SOLN
40.0000 mg | SUBCUTANEOUS | Status: DC
  Administered 2019-03-15 – 2019-03-16 (×2): 40 mg via SUBCUTANEOUS
  Filled 2019-03-15 (×2): qty 0.4

## 2019-03-15 MED ORDER — CELECOXIB 100 MG PO CAPS
100.0000 mg | ORAL_CAPSULE | Freq: Two times a day (BID) | ORAL | Status: DC
  Administered 2019-03-15 – 2019-03-16 (×3): 100 mg via ORAL
  Filled 2019-03-15 (×4): qty 1

## 2019-03-15 NOTE — TOC Initial Note (Signed)
Transition of Care [facility]) - Initial/Assessment Note    Patient Details  Name: Stephen Cervantes MRN: [id number] Date of Birth: Sep 16, 1940  Transition of Care [facility]) CM/SW Contact:    Su Hilt, RN Phone Number: 03/15/2019, 10:14 AM  Clinical Narrative:                  Met with the patient to discuss DC plan and needs He lives at home with Stephen Cervantes She will provide transportation for him if he is unable to drive He is set up with [facility] and Stephen Cervantes is aware He will need a RW and I notified Stephen Cervantes with [facility] afford medications with no problem PCP is Tejansie and he is up to date with appointment No further needs  Expected Discharge Plan: [place] Barriers to Discharge: Continued Medical Work up   Patient Goals and CMS Choice Patient states their goals for this hospitalization and ongoing recovery are:: go home with home health CMS Medicare.gov Compare Post Acute Care list provided to:: Patient Choice offered to / list presented to : Patient  Expected Discharge Plan and Services Expected Discharge Plan: [place]   Discharge Planning Services: CM Consult Post Acute Care Choice: [place] arrangements for the past 2 months: Single Family Home                 DME Arranged: Walker rolling DME Agency: [facility] Date DME Agency Contacted: 03/15/19 Time DME Agency Contacted: 53 Representative spoke with at DME Agency: Stephen Cervantes: PT [place] Date [place]: 03/15/19 Time [place]: 15 Representative spoke with at [place] Arrangements/Services Living arrangements for the past 2 months: [place] with:: Significant Other Patient language and need for interpreter reviewed:: No Do you feel safe going back to the place where you live?: Yes      Need for Family Participation in Patient Care: No (Comment) Care giver  support system in place?: No (comment)   Criminal Activity/Legal Involvement Pertinent to Current Situation/Hospitalization: No - Comment as needed  Activities of Daily Living Home Assistive Devices/Equipment: Eyeglasses ADL Screening (condition at time of admission) Patient's cognitive ability adequate to safely complete daily activities?: Yes Is the patient deaf or have difficulty hearing?: No Does the patient have difficulty seeing, even when wearing glasses/contacts?: No Does the patient have difficulty concentrating, remembering, or making decisions?: No Patient able to express need for assistance with ADLs?: Yes Does the patient have difficulty dressing or bathing?: No Independently performs ADLs?: Yes (appropriate for developmental age) Does the patient have difficulty walking or climbing stairs?: Yes Weakness of Legs: Right Weakness of Arms/Hands: None  Permission Sought/Granted Permission sought to share information with : Case Manager                Emotional Assessment Appearance:: Appears stated age Attitude/Demeanor/Rapport: Engaged Affect (typically observed): Accepting Orientation: : Oriented to Self, Oriented to Place, Oriented to  Time, Oriented to Situation Alcohol / Substance Use: Not Applicable Psych Involvement: No (comment)  Admission diagnosis:  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 S/P lumbar fusion 03/13/2019  . Kidney stones 08/26/2015  . Left ureteral stone 08/16/2015  . Hydronephrosis with urinary obstruction due to ureteral calculus 08/16/2015  . Microscopic hematuria 08/16/2015  . Neuritis or radiculitis due to rupture of lumbar intervertebral disc 05/29/2015  .  Left flank pain 07/04/2014  . Gallstones 07/04/2014  . Benign prostatic hyperplasia with urinary obstruction 01/09/2013  . Calculi, ureter 09/13/2012  . Calculus of kidney 09/13/2012  . Frank hematuria 09/13/2012  .  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 Renal colic 02/03/5614   PCP:  Jodi Marble, MD Pharmacy:   [facility], [place] [address] Phone: [phone] Fax: [phone]  [id number] [place], [place] [id number]: [phone]: [id number]: [phone]: [phone]    Social Determinants of Health (SDOH) Interventions    Readmission Risk Interventions No flowsheet data found.

## 2019-03-15 NOTE — Progress Notes (Addendum)
Procedure: L1-L5 lateral interbody fusion Procedure date: 03/13/2019 Diagnosis: Neurogenic claudication  History: Stephen Cervantes is s/p L1-L5 lateral interbody fusion for neurogenic claudication  POD 2: Continues to recover well but had a rough night with increase in back discomfort.  Continues to deny lower extremity symptoms such as pain/numbness/tingling/weakness.  Drain output 140 yesterday.  Currently at 50 since this morning.  POD1: Recovering well.  Pain rated 3/10.  Denies any lower extremity symptoms including pain/numbness/tingling/weakness.  Was able to ambulate with physical therapy.  Eating without issue. Drain output yesterday 160.  POD0: Tolerated procedure well.  Evaluated postoperatively still significantly disoriented from anesthesia.  Physical Exam: Vitals:   03/14/19 1650 03/14/19 2331  BP: 140/85 129/72  Pulse: 98 81  Resp: 17 18  Temp: 97.8 F (36.6 C) 98.1 F (36.7 C)  SpO2: 93% 94%    AA Ox3 Strength:5/5 throughout lower extremities Sensation: Intact and symmetric throughout lower extremities Skin:  POD 2: Dressing intact at incision site.   POD 1: Bleedthrough at posterior dressing site.  Dressing change without issue.  No active bleeding noted at incision sites.  No active bleeding noted at drain insertion point.  POD 0: Dressing intact, no active bleeding at drain insertion point.   Data:  No results for input(s): NA, K, CL, CO2, BUN, CREATININE, LABGLOM, GLUCOSE, CALCIUM in the last 168 hours. No results for input(s): AST, ALT, ALKPHOS in the last 168 hours.  Invalid input(s): TBILI   No results for input(s): WBC, HGB, HCT, PLT in the last 168 hours. No results for input(s): APTT, INR in the last 168 hours.       Other tests/results:  EXAM: LUMBAR SPINE - 2-3 VIEW 03/14/2019  COMPARISON:  03/13/2019 and prior radiographs  FINDINGS: Posterior rod and bipedicular screw fixation from L1-L5 noted as well as interbody cage devices at L1-2,  L2-3, L3-4 and L4-5.  No definite complicating features noted.  No acute fracture or spondylolisthesis.  IMPRESSION: Lumbar surgical changes without definite complicating features.  Assessment/Plan:  OWIN VIGNOLA is POD 2 status post L1-L5 lumbar fusion.  Continues to recover well but experienced increase in pain last night.  We will continue to monitor for pain control and drain output.  - monitor drain output - mobilize - pain control   - Toradol completed. Change to celebrex. Continue muscle relaxer, pain medications, tylenol. - DVT prophylaxis - lovenox - PTOT  Marin Olp PA-C Department of Neurosurgery

## 2019-03-15 NOTE — Progress Notes (Signed)
Physical Therapy Treatment Patient Details Name: Stephen Cervantes MRN: [id number] DOB: October 01, 1940 Today's Date: 03/15/2019    History of Present Illness 78 y/o male s/p L1-5 fusion 03/13/19.    PT Comments    Pt did relatively well with most activities and though he is complaining of increased pain this date he ultimately did well.  Again he was able to get to EOB with appropriate spinal alignment and awareness, some minimal near knee buckling, but overall safe for in-home ambulation distances and was able to negotiate up/down steps w/o assist.  Pt feeling more confident about ability to go home safely, though still somewhat pensive.     Follow Up Recommendations  Follow surgeon's recommendation for DC plan and follow-up therapies     Equipment Recommendations  Rolling walker with 5" wheels    Recommendations for Other Services       Precautions / Restrictions Precautions Precautions: Fall;Back Precaution Booklet Issued: Yes (comment) Required Braces or Orthoses: Spinal Brace Restrictions Weight Bearing Restrictions: No    Mobility  Bed Mobility Overal bed mobility: Independent             General bed mobility comments: Pt showed good awareness of neutral spine and was able to log roll with only cuing and no direct assist  Transfers Overall transfer level: Needs assistance Equipment used: Rolling walker (2 wheeled) Transfers: Sit to/from Stand Sit to Stand: Min guard         General transfer comment: Pt reliant on UEs, displayed weakness in LEs with labored push from low bed height but did not require phyiscal assist  Ambulation/Gait Ambulation/Gait assistance: Min guard Gait Distance (Feet): 125 Feet Assistive device: Rolling walker (2 wheeled)       General Gait Details: Pt with slow and cautious gait, but was safe and not overly reliant on the walker.  He did not fatigue nearly like he did yesterday and sats remained in the mid/high 90s on room  air.   Stairs Stairs: Yes Stairs assistance: Min guard Stair Management: One rail Left Number of Stairs: 4     Wheelchair Mobility    Modified Rankin (Stroke Patients Only)       Balance Overall balance assessment: Modified Independent                                          Cognition Arousal/Alertness: Awake/alert Behavior During Therapy: WFL for tasks assessed/performed Overall Cognitive Status: Within Functional Limits for tasks assessed                                        Exercises General Exercises - Lower Extremity Long Arc Quad: Strengthening;10 reps Heel Slides: Strengthening;10 reps Hip ABduction/ADduction: Strengthening;10 reps Hip Flexion/Marching: Strengthening;10 reps    General Comments        Pertinent Vitals/Pain Pain Assessment: 0-10 Pain Score: 5     Home Living                      Prior Function            PT Goals (current goals can now be found in the care plan section) Progress towards PT goals: Progressing toward goals    Frequency    7X/week      PT Plan Current  plan remains appropriate    Co-evaluation              AM-PAC PT "6 Clicks" Mobility   Outcome Measure  Help needed turning from your back to your side while in a flat bed without using bedrails?: None Help needed moving from lying on your back to sitting on the side of a flat bed without using bedrails?: None Help needed moving to and from a bed to a chair (including a wheelchair)?: A Little Help needed standing up from a chair using your arms (e.g., wheelchair or bedside chair)?: A Little Help needed to walk in hospital room?: A Little Help needed climbing 3-5 steps with a railing? : A Little 6 Click Score: 20    End of Session Equipment Utilized During Treatment: Gait belt;Back brace Activity Tolerance: Patient tolerated treatment well;Patient limited by pain Patient left: with chair alarm set;with  call bell/phone within reach Nurse Communication: Mobility status PT Visit Diagnosis: Muscle weakness (generalized) (M62.81);Difficulty in walking, not elsewhere classified (R26.2);Pain;Other abnormalities of gait and mobility (R26.89) Pain - part of body: (lumbago)     Time: 1914-7829 PT Time Calculation (min) (ACUTE ONLY): 44 min  Charges:  $Gait Training: 23-37 mins $Therapeutic Exercise: 8-22 mins                     Kreg Shropshire, DPT 03/15/2019, 1:20 PM

## 2019-03-15 NOTE — Progress Notes (Signed)
OT Cancellation Note  Patient Details Name: YUKIO BISPING MRN: [id number] DOB: [birth year]   Cancelled Treatment:    Reason Eval/Treat Not Completed: Patient declined, no reason specified. Pt politely declines additional OT, verbalizes recall of instruction provided previous date.   Jeni Salles, MPH, MS, OTR/L ascom [phone] 03/15/19, 10:31 AM

## 2019-03-16 MED ORDER — CELECOXIB 200 MG PO CAPS: 200.0000 mg | ORAL_CAPSULE | Freq: Every day | ORAL | 0 refills | Status: DC

## 2019-03-16 MED ORDER — TIZANIDINE HCL 4 MG PO TABS: 4.0000 mg | ORAL_TABLET | Freq: Four times a day (QID) | ORAL | 0 refills | Status: DC | PRN

## 2019-03-16 MED ORDER — ACETAMINOPHEN 325 MG PO TABS: 650.0000 mg | ORAL_TABLET | ORAL | 0 refills | Status: DC | PRN

## 2019-03-16 MED ORDER — OXYCODONE HCL 5 MG PO TABS: 5.0000 mg | ORAL_TABLET | ORAL | 0 refills | Status: DC | PRN

## 2019-03-16 MED ORDER — OXYCODONE HCL 10 MG PO TABS: 5.0000 mg | ORAL_TABLET | ORAL | 0 refills | Status: DC | PRN

## 2019-03-16 NOTE — Discharge Summary (Signed)
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 5/10 back pain. Drain output overnight 65. Continues to deny lower extremity pain/numbness/tingling/wekaness. Ambulating with walker, voiding, BM, and eating without issue.  POD 2: Continues to recover well but had a rough night with increase in back discomfort.  Continues to deny lower extremity symptoms such as pain/numbness/tingling/weakness.  Drain output 140 yesterday.  Currently at 50 since this morning.  POD1: Recovering well.  Pain rated 3/10.  Denies any lower extremity symptoms including pain/numbness/tingling/weakness.  Was able to ambulate with physical therapy.  Eating without issue. Drain output yesterday 160.  POD0: Tolerated procedure well.  Evaluated postoperatively still significantly disoriented from anesthesia.  Physical Exam: Vitals:   03/16/19 0344 03/16/19 0742  BP: 132/72 135/82  Pulse: 86 77  Resp: 14 17  Temp: 98.4 F (36.9 C) 98.6 F (37 C)  SpO2: 92% 95%    AA Ox3 Strength:5/5 throughout lower extremities Sensation: Intact and symmetric throughout lower extremities Skin:   POD3: scant drainage posterior incision dressing. No active bleeding. Glue intact at lateral incision sites. Drain pulled without issue. No active bleeding. Insertion site dressed with gauze and tape.   POD 2: Dressing intact at incision site.   POD 1: Bleedthrough at posterior dressing site.  Dressing change without issue.  No active bleeding noted at incision sites.  No active bleeding noted at drain insertion point.  POD 0: Dressing intact, no active bleeding at drain insertion point.   Data:  No results for input(s): NA, K, CL, CO2, BUN, CREATININE, LABGLOM, GLUCOSE, CALCIUM in the last 168 hours. No results for input(s): AST, ALT, ALKPHOS in the last 168  hours.  Invalid input(s): TBILI   No results for input(s): WBC, HGB, HCT, PLT in the last 168 hours. No results for input(s): APTT, INR in the last 168 hours.       Other tests/results:  EXAM: LUMBAR SPINE - 2-3 VIEW 03/14/2019  COMPARISON:  03/13/2019 and prior radiographs  FINDINGS: Posterior rod and bipedicular screw fixation from L1-L5 noted as well as interbody cage devices at L1-2, L2-3, L3-4 and L4-5.  No definite complicating features noted.  No acute fracture or spondylolisthesis.  IMPRESSION: Lumbar surgical changes without definite complicating features.  Assessment/Plan:  Stephen Cervantes is POD 3 status post L1-L5 lumbar fusion.  He is recovering well. Will continue post op pain control with tylenol, tizanidine, celebrex, and pain medication as needed. Set up with [facility] for home PT services. Discussed wound care and activity restrictions. Wear brace as instructed. He is scheduled to follow up in approximately 2 weeks to monitor progress.   Marin Olp PA-C Department of Neurosurgery

## 2019-03-16 NOTE — Progress Notes (Signed)
DISCHARGE NOTE:  Pt given discharge instructions and script (oxycodone). Back brace on, walker sent with pt, Pt wheeled to car by staff, family providing transportation.

## 2019-03-16 NOTE — Progress Notes (Addendum)
Physical Therapy Treatment Patient Details Name: Stephen Cervantes MRN: [id number] DOB: 1940-09-08 Today's Date: 03/16/2019    History of Present Illness 78 y/o male s/p L1-5 fusion 03/13/19.    PT Comments    Pt agreeable to PT; reports 3/10 back pain currently, but notes it varies with activity (up to 5). Pt continues to demonstrate slow/guarded functional movement, but overall steady without overt weakness/buckling in BLEs. Pt educated in use of abdominal bracing as an exercise and with additional exercises and functional activity. Denies concerns at this time with returning home. Pt to discharge home today with home health services.   Follow Up Recommendations  Follow surgeon's recommendation for DC plan and follow-up therapies     Equipment Recommendations  Rolling walker with 5" wheels    Recommendations for Other Services       Precautions / Restrictions Precautions Precautions: Fall;Back Required Braces or Orthoses: Spinal Brace Restrictions Weight Bearing Restrictions: No    Mobility  Bed Mobility                  Transfers Overall transfer level: Needs assistance Equipment used: Rolling walker (2 wheeled) Transfers: Sit to/from Stand Sit to Stand: Min guard         General transfer comment: slow to rise/guarded, but demonstrates steadiness   Ambulation/Gait Ambulation/Gait assistance: Min guard Gait Distance (Feet): 100 Feet Assistive device: Rolling walker (2 wheeled) Gait Pattern/deviations: Decreased stride length;Step-through pattern Gait velocity: decreased Gait velocity interpretation: <1.8 ft/sec, indicate of risk for recurrent falls General Gait Details: slow/guarded. Demonstrates steadiness without LE knee buckling.    Stairs             Wheelchair Mobility    Modified Rankin (Stroke Patients Only)       Balance                                            Cognition Arousal/Alertness:  Awake/alert Behavior During Therapy: WFL for tasks assessed/performed Overall Cognitive Status: Within Functional Limits for tasks assessed                                        Exercises Other Exercises Other Exercises: abdominal bracing as exercise and educated to use with activity and other exercises    General Comments        Pertinent Vitals/Pain Pain Assessment: 0-10 Pain Score: 3  Pain Location: low back pain Pain Intervention(s): Monitored during session    Home Living                      Prior Function            PT Goals (current goals can now be found in the care plan section) Progress towards PT goals: Progressing toward goals    Frequency    7X/week      PT Plan Current plan remains appropriate    Co-evaluation              AM-PAC PT "6 Clicks" Mobility   Outcome Measure  Help needed turning from your back to your side while in a flat bed without using bedrails?: None Help needed moving from lying on your back to sitting on the side of a flat bed without using bedrails?: A  Little Help needed moving to and from a bed to a chair (including a wheelchair)?: A Little Help needed standing up from a chair using your arms (e.g., wheelchair or bedside chair)?: A Little Help needed to walk in hospital room?: A Little Help needed climbing 3-5 steps with a railing? : A Little 6 Click Score: 19    End of Session Equipment Utilized During Treatment: Gait belt;Back brace Activity Tolerance: Patient tolerated treatment well Patient left: with chair alarm set;with call bell/phone within reach   PT Visit Diagnosis: Muscle weakness (generalized) (M62.81);Difficulty in walking, not elsewhere classified (R26.2);Pain;Other abnormalities of gait and mobility (R26.89)     Time: 8341-9622 PT Time Calculation (min) (ACUTE ONLY): 20 min  Charges:  $Gait Training: 8-22 mins                      Larae Grooms, PTA 03/16/2019, 11:46  AM

## 2019-03-16 NOTE — Discharge Instructions (Signed)
Your surgeon has performed an operation on your lumbar spine (low back) to fuse two or more of the vertebrae (bones) together. This procedure is performed to treat a number of different spinal problems, including narrowing of the spinal canal (stenosis), herniated discs, degenerative changes, and injuries.  ° °Many times, patients feel better immediately after surgery and can "overdo it." Even if you feel well, it is important that you follow these activity guidelines. If you do not let your back heal properly from the surgery, you can increase the chance of return of your symptoms and other complications. The following are instructions to help in your recovery once you have been discharged from the hospital.  ° °* Do not take anti-inflammatory medications for 3 months after surgery (naproxen [Aleve], ibuprofen [Advil, Motrin], celecoxib [Celebrex], etc.). These medications can prevent your bones from healing properly.  ° °Activity  °   °No bending, lifting, or twisting ("BLT"). Avoid lifting objects heavier than 10 pounds (gallon milk jug).  Where possible, avoid household activities that involve lifting, bending, reaching, pushing, or pulling such as laundry, vacuuming, grocery shopping, and childcare. Try to arrange for help from friends and family for these activities while your back heals.  ° °Increase physical activity slowly as tolerated.  Taking short walks is encouraged, but avoid strenuous exercise. Do not jog, run, bicycle, lift weights, or participate in any other exercises unless specifically allowed by your doctor. Avoid prolonged sitting, including car rides.  ° °Talk to your doctor before resuming sexual activity.  ° °You should not drive until cleared by your doctor.  ° °Until released by your doctor, you should not return to work or school.  You should rest at home and let your body heal.  ° °You may shower three days after your surgery.  After showering, lightly dab your incision dry. Do not take  a tub bath or go swimming until approved by your doctor at your follow-up appointment.  ° °If your doctor ordered a lumbar brace for you, you should wear it whenever you are out of bed. You may remove it when lying down or sleeping. You should also wear it when riding in a car. Not all back surgeries require a lumbar brace.  ° °If you smoke, we strongly recommend that you quit.  Smoking has been proven to interfere with normal bone healing and will dramatically reduce the success rate of your surgery. Please contact [facility] (800-QUIT-NOW) and use the resources at www.[facility].com for assistance in stopping smoking.  ° °Surgical Incision  ° °If you have a dressing on your incision, you may remove it two days after your surgery. Keep your incision area clean and dry.  ° °If you have staples or stitches on your incision, you should have a follow up scheduled for removal. If you do not have staples or stitches, you will have steri-strips (small pieces of surgical tape) or Dermabond glue. The steri-strips/glue should begin to peel away within about a week (it is fine if the steri-strips fall off before then). If the strips are still in place one week after your surgery, you may gently remove them.  ° °Diet          ° ° You may return to your usual diet. Be sure to stay hydrated.  ° °When to Contact Us  ° °Although your surgery and recovery will likely be uneventful, you may have some residual numbness, aches, and pains in your back and/or legs. This is normal and should   improve in the next few weeks.  ° °However, should you experience any of the following, contact us immediately:  ° - New numbness or weakness  ° - Pain that is progressively getting worse, and is not relieved by your pain medications or rest  ° - Bleeding, redness, swelling, pain, or drainage from surgical incision  ° - Chills or flu-like symptoms  ° - Fever greater than 101.0 F (38.3 C)  ° - Problems with bowel or bladder functions  ° - Difficulty  breathing or shortness of breath  ° - Warmth, tenderness, or swelling in your calf  °Contact Information  ° - During office hours (Monday-Friday 9 am to 5 pm), please call your physician at [phone] ([place])  ° - After hours and weekends, please call the [facility] Operator at [phone] and ask for the Neurosurgery Resident On Call  ° - For a life-threatening emergency, call 911  °

## 2019-03-17 ENCOUNTER — Other Ambulatory Visit: Payer: Self-pay

## 2019-03-17 ENCOUNTER — Encounter: Payer: Self-pay | Admitting: Emergency Medicine

## 2019-03-17 ENCOUNTER — Emergency Department: Payer: Medicare Other

## 2019-03-17 ENCOUNTER — Emergency Department
Admission: EM | Admit: 2019-03-17 | Discharge: 2019-03-17 | Disposition: A | Discharge status: Home / Self Care | Payer: Medicare Other | Attending: Emergency Medicine | Admitting: Emergency Medicine

## 2019-03-17 DIAGNOSIS — Z96649 Presence of unspecified artificial hip joint: Secondary | ICD-10-CM | POA: Diagnosis not present

## 2019-03-17 DIAGNOSIS — R338 Other retention of urine: Secondary | ICD-10-CM | POA: Diagnosis not present

## 2019-03-17 DIAGNOSIS — I10 Essential (primary) hypertension: Secondary | ICD-10-CM | POA: Diagnosis not present

## 2019-03-17 DIAGNOSIS — Z87891 Personal history of nicotine dependence: Secondary | ICD-10-CM | POA: Insufficient documentation

## 2019-03-17 DIAGNOSIS — Z79899 Other long term (current) drug therapy: Secondary | ICD-10-CM | POA: Insufficient documentation

## 2019-03-17 DIAGNOSIS — R339 Retention of urine, unspecified: Secondary | ICD-10-CM | POA: Diagnosis present

## 2019-03-17 LAB — URINALYSIS, COMPLETE (UACMP) WITH MICROSCOPIC
Bacteria, UA: NONE SEEN
Bilirubin Urine: NEGATIVE
Glucose, UA: NEGATIVE mg/dL
Ketones, ur: NEGATIVE mg/dL
Leukocytes,Ua: NEGATIVE
Nitrite: NEGATIVE
Protein, ur: NEGATIVE mg/dL
Specific Gravity, Urine: 1.018 (ref 1.005–1.030)
Squamous Epithelial / HPF: NONE SEEN (ref 0–5)
pH: 5 (ref 5.0–8.0)

## 2019-03-17 MED ORDER — LIDOCAINE HCL URETHRAL/MUCOSAL 2 % EX GEL
1.0000 "application " | Freq: Once | CUTANEOUS | Status: AC
  Administered 2019-03-17: 1 via URETHRAL
  Filled 2019-03-17: qty 10

## 2019-03-17 MED ORDER — OXYCODONE HCL 5 MG PO TABS
5.0000 mg | ORAL_TABLET | ORAL | Status: AC
  Administered 2019-03-17: 09:00:00 5 mg via ORAL
  Filled 2019-03-17: qty 1

## 2019-03-17 NOTE — ED Provider Notes (Signed)
[facility] Emergency Department Provider Note   ____________________________________________   First MD Initiated Contact with Patient 03/17/19 [phone]     (approximate)  I have reviewed the triage vital signs and the nursing notes.   HISTORY  Chief Complaint Urinary Retention    HPI Stephen Cervantes is a 78 y.o. male here for evaluation of inability urinate  Patient recent lumbar surgery with Dr. Cari Caraway.  He had a catheter after surgery.  He reports that he had some difficulty urinating after it was removed.  He went home yesterday and had difficulty with urination, but since about midnight has not been able to urinate at all.  Not associated any new numbness or weakness in the legs.  No nausea vomiting.  Reports pain like he cannot urinate or empty his bladder.  Reports he thinks he needs a catheter placed again  Denies history of prostate problems or difficulty with urination previous to this.  No fevers or chills.  Lower back is sore, has taken 2 Tylenol today but would be happy to have 1 of his prescribed oxycodone as well which she has not taken yet  Has follow-up planned with Dr. Cari Caraway.  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   Kidney stones 08/26/2015   Left ureteral stone 08/16/2015   Hydronephrosis with urinary obstruction due to ureteral calculus 08/16/2015     Microscopic hematuria 08/16/2015   Neuritis or radiculitis due to rupture of lumbar intervertebral disc 05/29/2015   Left flank pain 07/04/2014   Gallstones 07/04/2014   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   Celebration   ILEOSTOMY  2007   JOINT REPLACEMENT  2014   Hip Replacement Hessie Knows   KNEE SURGERY Right 2011   POSTERIOR LUMBAR FUSION 4 LEVEL N/A 03/13/2019   Procedure: POSTERIOR LUMBAR FUSION 4 LEVEL L1-5;  Surgeon: Meade Maw, MD;  Location: [facility] ORS;  Service: Neurosurgery;  Laterality: N/A;    Prior to Admission medications   Medication Sig Start Date End Date Taking? Authorizing Provider  acetaminophen (TYLENOL) 325 MG tablet Take 2 tablets (650 mg total) by mouth every 4 (four) hours as needed for mild pain ((score 1 to 3) or temp > 100.5). 03/16/19   Marin Olp, PA-C  atorvastatin (LIPITOR) 10 MG tablet Take 10 mg by mouth daily.     [provider]  Calcium-Magnesium-Vitamin D (CALCIUM 1200+D3 PO) Take  1 tablet by mouth 2 (two) times a day.    [provider]  celecoxib (CELEBREX) 200 MG capsule Take 1 capsule (200 mg total) by mouth daily. 03/16/19   Marin Olp, PA-C  Glucosamine 500 MG CAPS Take 500 mg by mouth daily.    [provider]  hydrochlorothiazide (HYDRODIURIL) 25 MG tablet Take 25 mg by mouth daily.  06/26/14   [provider]  levothyroxine (SYNTHROID) 150 MCG tablet Take 150 mcg by mouth daily before breakfast.    [provider]  loperamide (IMODIUM) 2 MG  capsule Take 4 mg by mouth 3 (three) times daily.     [provider]  loratadine (CLARITIN) 10 MG tablet Take 10 mg by mouth daily.    [provider]  Lutein 20 MG CAPS Take 20 mg by mouth daily.     [provider]  Magnesium 250 MG TABS Take 250 mg by mouth daily.    [provider]  montelukast (SINGULAIR) 10 MG tablet Take 10 mg by mouth daily.  06/26/14   [provider]  Multiple Vitamin (MULTIVITAMIN WITH MINERALS) TABS tablet Take 1 tablet by mouth daily.    [provider]  oxyCODONE 10 MG TABS Take 0.5-1 tablets (5-10 mg total) by mouth every 3 (three) hours as needed for severe pain ((score 7 to 10)). 03/16/19   Marin Olp, PA-C  pantoprazole (PROTONIX) 40 MG tablet Take 40 mg by mouth daily before breakfast.  08/09/18   [provider]  potassium chloride (K-DUR) 10 MEQ tablet Take 20 mEq by mouth 2 (two) times a day.  06/11/18   [provider]  sucralfate (CARAFATE) 1 g tablet Take 1 g by mouth 2 (two) times a day.     [provider]  tiZANidine (ZANAFLEX) 4 MG tablet Take 1 tablet (4 mg total) by mouth every 6 (six) hours as needed. 03/16/19   Marin Olp, PA-C  vitamin B-12 (CYANOCOBALAMIN) 500 MCG tablet Take 500 mcg by mouth daily.    [provider]  Zinc 50 MG TABS Take 50 mg by mouth daily.    [provider]    Allergies Morphine and related, Other, and Tape  Family History  Problem Relation Age of Onset   Cancer Father        cancer of lung, brain  and lymphoma   Parkinson's disease Brother    Mesothelioma Brother    Kidney disease Neg Hx    Prostate cancer Neg Hx    Kidney cancer Neg Hx    Bladder Cancer Neg Hx     Social History Social History   Tobacco Use   Smoking status: Former Smoker    Types: Cigarettes    Quit date: 1998    Years since quitting: 22.5   Smokeless tobacco: Never Used  Substance Use Topics   Alcohol use: No     Alcohol/week: 0.0 standard drinks   Drug use: No    Review of Systems Constitutional: No fever/chills Eyes: No visual changes. ENT: No sore throat. Cardiovascular: Denies chest pain. Respiratory: Denies shortness of breath. Gastrointestinal: No abdominal pain except cannot empty his bladder.  Still eating and drinking okay.   Genitourinary: Cannot urinate. Musculoskeletal: Negative for back pain except over his lower back after surgery she reports the pains been pretty steady since the time finishing surgery no acute worsening. Skin: Negative for rash. Neurological: Negative for headaches, areas of focal weakness or numbness.    ____________________________________________  PHYSICAL EXAM:  VITAL SIGNS: ED Triage Vitals  Enc Vitals Group     BP 03/17/19 0833 120/75     Pulse Rate 03/17/19 0833 90     Resp 03/17/19 0833 16     Temp 03/17/19 0833 98.6 F (37 C)     Temp Source 03/17/19 0833 Oral     SpO2 03/17/19 0833 95 %     Weight 03/17/19 0834 180 lb (81.6 kg)     Height 03/17/19 0834 5\' 10"  (1.778 m)     Head Circumference --      Peak Flow --      Pain Score 03/17/19 0833 8     Pain Loc --      Pain Edu? --      Excl. in [place]? --     Constitutional: Alert and oriented. Well appearing but does appear in discomfort, appears in moderate pain reporting feels like his bladder is very swollen and distended Eyes: Conjunctivae are normal. Head: Atraumatic. Nose: No congestion/rhinnorhea. Mouth/Throat: Mucous membranes are moist. Neck: No stridor.  Cardiovascular: Normal rate, regular rhythm. Grossly normal heart sounds.  Good peripheral circulation. Respiratory: Normal respiratory effort.  No retractions. Lungs CTAB. Gastrointestinal: Soft and nontender. No distention. Musculoskeletal: No lower extremity tenderness nor edema.  Cervical and thoracic spine normal.  Lumbar region some slight bruising but no edema drainage or redness, 2 staple lines clean dry and  intact. Normal circumcised penis.  No scrotal swelling or edema. Neurologic:  Normal speech and language. No gross focal neurologic deficits are appreciated.  Skin:  Skin is warm, dry and intact. No rash noted. Psychiatric: Mood and affect are normal. Speech and behavior are normal.  ____________________________________________   LABS (all labs ordered are listed, but only abnormal results are displayed)  Labs Reviewed  URINALYSIS, COMPLETE (UACMP) WITH MICROSCOPIC - Abnormal; Notable for the following components:      Result Value   Color, Urine YELLOW (*)    APPearance CLEAR (*)    Hgb urine dipstick MODERATE (*)    All other components within normal limits   ____________________________________________  EKG   ____________________________________________  RADIOLOGY  Mr Lumbar Spine Wo Contrast  Result Date: 03/17/2019 CLINICAL DATA:  Patient status post lumbar fusion 03/13/2019. He was discharged from the hospital yesterday and is now unable to urinate. EXAM: MRI LUMBAR SPINE WITHOUT CONTRAST TECHNIQUE: Multiplanar, multisequence MR imaging of the lumbar spine was performed. No intravenous contrast was administered. COMPARISON:  MRI lumbar spine 11/11/2018. Plain films lumbar spine 03/14/2019. FINDINGS: Segmentation:  Standard. Alignment: 0.5 cm retrolisthesis L1 on L2 and trace retrolisthesis L2 on L3. Alignment is unchanged. Vertebrae:  No fracture, evidence of discitis, or bone lesion. Conus medullaris and cauda equina: Conus extends to the L1 level. Conus and cauda equina appear normal. Paraspinal and other soft tissues: Negative. Disc levels: T12-L1: Central disc extrusion with cephalad extension is unchanged. Mild central canal narrowing is again seen. Foramina are open. L1-2: Status post discectomy and fusion since the prior exam. Small anterior endplate spur is present. No stenosis. L2-3: Status post discectomy and fusion since the prior MRI. The central canal and foramina are  open. L3-4: Status post discectomy and fusion. Right facet joint effusion is new since the prior MRI. No edema is seen in the right facets. Narrowing in the right subarticular recess and severe right foraminal narrowing appear unchanged. Left foramen is open. L4-5: Status post discectomy and fusion. Small to moderate facet joint effusions, larger on the left,  are new since the prior examination. The central canal and foramina appear open. L5-S1: Facet degenerative disease and a shallow disc bulge. No stenosis or change. IMPRESSION: No finding to explain the patient's inability to urinate. Status post L1-5 discectomy and fusion since the prior MRI. Right subarticular recess and severe right foraminal narrowing at L3-4 appear unchanged. Small right facet joint effusion at L3-4 and small to moderate bilateral facet joint effusions at L4-5 are new since the preoperative examination and nonspecific. There is no marrow edema in the facets suggest osteomyelitis. Electronically Signed   By: Inge Rise M.D.   On: 03/17/2019 11:18    Imaging reviewed, no acute findings regarding urinary retention.  Sent imaging to Dr. Cari Caraway for review ____________________________________________   PROCEDURES  Procedure(s) performed: None  Procedures  Critical Care performed: No  ____________________________________________   INITIAL IMPRESSION / ASSESSMENT AND PLAN / ED COURSE  Pertinent labs & imaging results that were available during my care of the patient were reviewed by me and considered in my medical decision making (see chart for details).   Patient presents for evaluation of inability to void postoperatively.  Differential diagnosis includes induced by anesthesia, medications, prostate, edema after catheter, or felt less likely acute complication such as cord compression after surgery.  Discussed with Dr. Lysle Morales, advises Foley catheter placement we have attempted once and difficulty passing will  have to try a smaller catheter and Urojet now.  Additionally, discussed with patient I will order a lumbar spine MRI to exclude acute complication such as cord compression etiology.  Discussed with Dr. Lacinda Axon, would anticipate discharge with Foley catheter in place to be removed later at office visit    Clinical Course as of Mar 16 1150  Sun Mar 17, 2019  1008 Discussed with Dr. Cari Caraway. Go ahead with MRI but very low risk. Dr. Cari Caraway will have RN setup a voiding trial end of week with Urology.    [MQ]  1027 Urinalysis reviewed negative for infectious findings.  Foley placed, 700 mL's of urine obtained at this time,   [MQ]    Clinical Course User Index [MQ] Delman Kitten, MD    ----------------------------------------- 11:51 AM on 03/17/2019 -----------------------------------------  Patient resting comfortably.  Feels well.  Ready to go home.  Comfortable with close follow-up and Dr. Cari Caraway will arrange for the patient to have close follow-up with his Foley catheter.  No signs or symptoms of infection.  Return precautions and treatment recommendations and follow-up discussed with the patient who is agreeable with the plan.  Catheter draining clear yellow urine.  Patient's girlfriend will be picking him up ____________________________________________   FINAL CLINICAL IMPRESSION(S) / ED DIAGNOSES  Final diagnoses:  Acute urinary retention        Note:  This document was prepared using Dragon voice recognition software and may include unintentional dictation errors       Delman Kitten, MD 03/17/19 1152

## 2019-03-17 NOTE — ED Notes (Signed)
Foley insertion attempt x1 unsuccessful. Called supply for urinary cart for coude catheter. Urojet applied per MD order.

## 2019-03-17 NOTE — ED Notes (Signed)
Bladder scanned pt. Greatest reading was >801 ml. MD and RN notified.

## 2019-03-17 NOTE — ED Triage Notes (Signed)
Pt to ED via POV c/o urinary retention. Pt had surgery on 78/20 and was D/C from hospital yesterday. Pt states that he has been unable to void. Is only passing a very amount of urine. Pt states that he feels like his bladder is full.

## 2019-03-24 ENCOUNTER — Encounter: Payer: Self-pay | Admitting: Emergency Medicine

## 2019-03-24 ENCOUNTER — Emergency Department
Admission: EM | Admit: 2019-03-24 | Discharge: 2019-03-24 | Disposition: A | Discharge status: Home Health | Payer: Medicare Other | Attending: Emergency Medicine | Admitting: Emergency Medicine

## 2019-03-24 ENCOUNTER — Emergency Department: Payer: Medicare Other

## 2019-03-24 ENCOUNTER — Other Ambulatory Visit: Payer: Self-pay

## 2019-03-24 DIAGNOSIS — Z87891 Personal history of nicotine dependence: Secondary | ICD-10-CM | POA: Insufficient documentation

## 2019-03-24 DIAGNOSIS — I10 Essential (primary) hypertension: Secondary | ICD-10-CM | POA: Diagnosis not present

## 2019-03-24 DIAGNOSIS — L539 Erythematous condition, unspecified: Secondary | ICD-10-CM | POA: Diagnosis not present

## 2019-03-24 DIAGNOSIS — L03115 Cellulitis of right lower limb: Secondary | ICD-10-CM | POA: Diagnosis not present

## 2019-03-24 DIAGNOSIS — J45909 Unspecified asthma, uncomplicated: Secondary | ICD-10-CM | POA: Insufficient documentation

## 2019-03-24 DIAGNOSIS — R2243 Localized swelling, mass and lump, lower limb, bilateral: Secondary | ICD-10-CM | POA: Diagnosis present

## 2019-03-24 DIAGNOSIS — Z960 Presence of urogenital implants: Secondary | ICD-10-CM | POA: Diagnosis not present

## 2019-03-24 DIAGNOSIS — M545 Low back pain: Secondary | ICD-10-CM | POA: Diagnosis not present

## 2019-03-24 LAB — CBC WITH DIFFERENTIAL/PLATELET
Abs Immature Granulocytes: 0.13 10*3/uL — ABNORMAL HIGH (ref 0.00–0.07)
Basophils Absolute: 0.1 10*3/uL (ref 0.0–0.1)
Basophils Relative: 0 %
Eosinophils Absolute: 0.2 10*3/uL (ref 0.0–0.5)
Eosinophils Relative: 1 %
HCT: 31.7 % — ABNORMAL LOW (ref 39.0–52.0)
Hemoglobin: 10.4 g/dL — ABNORMAL LOW (ref 13.0–17.0)
Immature Granulocytes: 1 %
Lymphocytes Relative: 6 %
Lymphs Abs: 0.7 10*3/uL (ref 0.7–4.0)
MCH: 29.3 pg (ref 26.0–34.0)
MCHC: 32.8 g/dL (ref 30.0–36.0)
MCV: 89.3 fL (ref 80.0–100.0)
Monocytes Absolute: 0.7 10*3/uL (ref 0.1–1.0)
Monocytes Relative: 5 %
Neutro Abs: 11.3 10*3/uL — ABNORMAL HIGH (ref 1.7–7.7)
Neutrophils Relative %: 87 %
Platelets: 273 10*3/uL (ref 150–400)
RBC: 3.55 MIL/uL — ABNORMAL LOW (ref 4.22–5.81)
RDW: 13.9 % (ref 11.5–15.5)
WBC: 13.1 10*3/uL — ABNORMAL HIGH (ref 4.0–10.5)
nRBC: 0 % (ref 0.0–0.2)

## 2019-03-24 LAB — URINALYSIS, COMPLETE (UACMP) WITH MICROSCOPIC
Bilirubin Urine: NEGATIVE
Glucose, UA: NEGATIVE mg/dL
Ketones, ur: NEGATIVE mg/dL
Nitrite: NEGATIVE
Protein, ur: NEGATIVE mg/dL
Specific Gravity, Urine: 1.004 — ABNORMAL LOW (ref 1.005–1.030)
Squamous Epithelial / HPF: NONE SEEN (ref 0–5)
pH: 7 (ref 5.0–8.0)

## 2019-03-24 LAB — COMPREHENSIVE METABOLIC PANEL
ALT: 31 U/L (ref 0–44)
AST: 35 U/L (ref 15–41)
Albumin: 3.3 g/dL — ABNORMAL LOW (ref 3.5–5.0)
Alkaline Phosphatase: 165 U/L — ABNORMAL HIGH (ref 38–126)
Anion gap: 5 (ref 5–15)
BUN: 14 mg/dL (ref 8–23)
CO2: 25 mmol/L (ref 22–32)
Calcium: 8.9 mg/dL (ref 8.9–10.3)
Chloride: 106 mmol/L (ref 98–111)
Creatinine, Ser: 1.04 mg/dL (ref 0.61–1.24)
GFR calc Af Amer: >60 mL/min (ref >60)
GFR calc non Af Amer: >60 mL/min (ref >60)
Glucose, Bld: 126 mg/dL — ABNORMAL HIGH (ref 70–99)
Potassium: 4 mmol/L (ref 3.5–5.1)
Sodium: 136 mmol/L (ref 135–145)
Total Bilirubin: 1.2 mg/dL (ref 0.3–1.2)
Total Protein: 6.3 g/dL — ABNORMAL LOW (ref 6.5–8.1)

## 2019-03-24 LAB — LACTIC ACID, PLASMA: Lactic Acid, Venous: 1.8 mmol/L (ref 0.5–1.9)

## 2019-03-24 LAB — BRAIN NATRIURETIC PEPTIDE: B Natriuretic Peptide: 68 pg/mL (ref 0.0–100.0)

## 2019-03-24 LAB — TROPONIN I (HIGH SENSITIVITY)
Troponin I (High Sensitivity): 6 ng/L (ref <18)
Troponin I (High Sensitivity): 5 ng/L (ref <18)

## 2019-03-24 MED ORDER — SODIUM CHLORIDE 0.9 % IV SOLN
1.0000 g | Freq: Once | INTRAVENOUS | Status: AC
  Administered 2019-03-24: 15:00:00 1 g via INTRAVENOUS
  Filled 2019-03-24: qty 10

## 2019-03-24 MED ORDER — FUROSEMIDE 10 MG/ML IJ SOLN
40.0000 mg | Freq: Once | INTRAMUSCULAR | Status: AC
  Administered 2019-03-24: 40 mg via INTRAVENOUS
  Filled 2019-03-24: qty 4

## 2019-03-24 MED ORDER — CEPHALEXIN 500 MG PO CAPS: 500.0000 mg | ORAL_CAPSULE | Freq: Four times a day (QID) | ORAL | 0 refills | Status: AC

## 2019-03-24 NOTE — ED Notes (Signed)
Pt st having recent back surgery. Pt when home and started noticing BLE swelling. Pt st "it feels like somebody is pumping me up with water". Pt denies CP/SHOB at this time.

## 2019-03-24 NOTE — Discharge Instructions (Signed)
Follow-up with the urologist this week as planned, and with the neurosurgeon as scheduled.  Take the antibiotic as prescribed and finish the full course.  Return to the ER for new, worsening, or persistent leg swelling, redness or rash, fever, new weakness or numbness, or any new or worsening back pain.

## 2019-03-24 NOTE — ED Notes (Signed)
Pt ambulatory toilet with cane. Pt st he has a "foley" in place and a leg bag. This RN emptied pt's leg bag.

## 2019-03-24 NOTE — ED Notes (Signed)
ED Provider Funke at bedside.

## 2019-03-24 NOTE — ED Provider Notes (Signed)
[facility] Emergency Department Provider Note ____________________________________________   First MD Initiated Contact with Patient 03/24/19 1305     (approximate)  I have reviewed the triage vital signs and the nursing notes.   HISTORY  Chief Complaint Leg Swelling and Back Pain    HPI Stephen Cervantes is a 78 y.o. male with PMH as noted below and status post lumbar fusion on 03/13/2019 who presents with bilateral lower extremity swelling, gradual onset, and worse in the right leg extending all the way up to his hip.  It is associated with some redness on the right leg as well.  The patient developed urinary retention after the surgery and 1 week ago had a Foley catheter placed.  He states that it is still present.  He denies any significant suprapubic pain or fever.  The patient reports lower back pain since the surgery, but states that it has been a steady course and has not worsened at all.  He denies fever, chills, or generalized fatigue.  He has no shortness of breath.  Past Medical History:  Diagnosis Date  . Arthritis   . Asthma   . Bowel obstruction ([place]) [id number]  . Depression   . Diverticulitis 2006  . Enlarged prostate   . Gallstones 07/04/2014  . History of gastrointestinal ulcer   . History of kidney stones   . HLD (hyperlipidemia)   . HTN (hypertension)   . Kidney stone    kidney stones  . Neuritis or radiculitis due to rupture of lumbar intervertebral disc 05/29/2015  . Thyroid disease     Patient Active Problem List   Diagnosis Date Noted  . S/P lumbar fusion 03/13/2019  . Kidney stones 08/26/2015  . Left ureteral stone 08/16/2015  . Hydronephrosis with urinary obstruction due to ureteral calculus 08/16/2015  . Microscopic hematuria 08/16/2015  . Neuritis or radiculitis due to rupture of lumbar intervertebral disc 05/29/2015  . Left flank pain 07/04/2014  . Gallstones 07/04/2014  . Benign prostatic hyperplasia with urinary  obstruction 01/09/2013  . Calculi, ureter 09/13/2012  . Calculus of kidney 09/13/2012  . Frank hematuria 09/13/2012  . 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 Renal colic 56/21/3086    Past Surgical History:  Procedure Laterality Date  . COLECTOMY  2006   Abdominal colectomy, ileostomy, Hartman procedure for perforation of cecum, sigmoid diverticulitis  . HERNIA REPAIR  2008?   [place]  . ILEOSTOMY  2007  . JOINT REPLACEMENT  2014   Hip Replacement Hessie Knows  . KNEE SURGERY Right 2011  . POSTERIOR LUMBAR FUSION 4 LEVEL N/A 03/13/2019   Procedure: POSTERIOR LUMBAR FUSION 4 LEVEL L1-5;  Surgeon: Meade Maw, MD;  Location: [facility] ORS;  Service: Neurosurgery;  Laterality: N/A;    Prior to Admission medications   Medication Sig Start Date End Date Taking? Authorizing Provider  acetaminophen (TYLENOL) 325 MG tablet Take 2 tablets (650 mg total) by mouth every 4 (four) hours as needed for mild pain ((score 1 to 3) or temp > 100.5). 03/16/19   Marin Olp, PA-C  atorvastatin (LIPITOR) 10 MG tablet Take 10 mg by mouth daily.     [provider]  Calcium-Magnesium-Vitamin D (CALCIUM 1200+D3 PO) Take 1 tablet by mouth 2 (two) times a day.    [provider]  celecoxib (CELEBREX) 200 MG capsule Take 1 capsule (200 mg total) by mouth daily. 03/16/19   Marin Olp, PA-C  cephALEXin (KEFLEX) 500 MG  capsule Take 1 capsule (500 mg total) by mouth 4 (four) times daily for 7 days. 03/24/19 03/31/19  Arta Silence, MD  Glucosamine 500 MG CAPS Take 500 mg by mouth daily.    [provider]  hydrochlorothiazide (HYDRODIURIL) 25 MG tablet Take 25 mg by mouth daily.  06/26/14   [provider]  levothyroxine (SYNTHROID) 150 MCG tablet Take 150 mcg by mouth daily before breakfast.    [provider]  loperamide (IMODIUM) 2 MG capsule Take 4 mg by mouth 3 (three) times daily.     [provider]   loratadine (CLARITIN) 10 MG tablet Take 10 mg by mouth daily.    [provider]  Lutein 20 MG CAPS Take 20 mg by mouth daily.     [provider]  Magnesium 250 MG TABS Take 250 mg by mouth daily.    [provider]  montelukast (SINGULAIR) 10 MG tablet Take 10 mg by mouth daily.  06/26/14   [provider]  Multiple Vitamin (MULTIVITAMIN WITH MINERALS) TABS tablet Take 1 tablet by mouth daily.    [provider]  oxyCODONE 10 MG TABS Take 0.5-1 tablets (5-10 mg total) by mouth every 3 (three) hours as needed for severe pain ((score 7 to 10)). 03/16/19   Marin Olp, PA-C  pantoprazole (PROTONIX) 40 MG tablet Take 40 mg by mouth daily before breakfast.  08/09/18   [provider]  potassium chloride (K-DUR) 10 MEQ tablet Take 20 mEq by mouth 2 (two) times a day.  06/11/18   [provider]  sucralfate (CARAFATE) 1 g tablet Take 1 g by mouth 2 (two) times a day.     [provider]  tiZANidine (ZANAFLEX) 4 MG tablet Take 1 tablet (4 mg total) by mouth every 6 (six) hours as needed. 03/16/19   Marin Olp, PA-C  vitamin B-12 (CYANOCOBALAMIN) 500 MCG tablet Take 500 mcg by mouth daily.    [provider]  Zinc 50 MG TABS Take 50 mg by mouth daily.    [provider]    Allergies Morphine and related, Other, and Tape  Family History  Problem Relation Age of Onset  . Cancer Father        cancer of lung, brain  and lymphoma  . Parkinson's disease Brother   . Mesothelioma Brother   . Kidney disease Neg Hx   . Prostate cancer Neg Hx   . Kidney cancer Neg Hx   . Bladder Cancer Neg Hx     Social History Social History   Tobacco Use  . Smoking status: Former Smoker    Types: Cigarettes    Quit date: 1998    Years since quitting: 22.5  . Smokeless tobacco: Never Used  Substance Use Topics  . Alcohol use: No    Alcohol/week: 0.0 standard drinks  . Drug use: No    Review of Systems   Constitutional: No fever/chills. Eyes: No redness. ENT: No sore throat. Cardiovascular: Denies chest pain. Respiratory: Denies shortness of breath. Gastrointestinal: No vomiting or diarrhea.  Genitourinary: Negative for dysuria or suprapubic pain.  Musculoskeletal: Positive for back pain. Skin: Negative for rash. Neurological: Negative for focal weakness or numbness.   ____________________________________________   PHYSICAL EXAM:  VITAL SIGNS: ED Triage Vitals  Enc Vitals Group     BP 03/24/19 1235 136/72     Pulse Rate 03/24/19 1235 74     Resp 03/24/19 1235 20     Temp 03/24/19 1235 97.7  F (36.5 C)     Temp Source 03/24/19 1235 Oral     SpO2 03/24/19 1235 98 %     Weight 03/24/19 1032 180 lb (81.6 kg)     Height 03/24/19 1032 5\' 10"  (1.778 m)     Head Circumference --      Peak Flow --      Pain Score 03/24/19 1032 5     Pain Loc --      Pain Edu? --      Excl. in [place]? --     Constitutional: Alert and oriented.  Slightly uncomfortable appearing but in no acute distress. Eyes: Conjunctivae are normal.  Head: Atraumatic. Nose: No congestion/rhinnorhea. Mouth/Throat: Mucous membranes are moist.   Neck: Normal range of motion.  Cardiovascular: Normal rate, regular rhythm. Good peripheral circulation. Respiratory: Normal respiratory effort.  No retractions.  Gastrointestinal:  No distention.  Musculoskeletal: Trace left lower extremity edema, and approximately 1+ right lower extremity edema.  Faint erythema to the anterior right lower extremity below the knee, and warmer to palpation than the left.  2+ DP pulses bilaterally.  Full range of motion of bilateral hips and knees. Neurologic:  Normal speech and language.  5/5 motor strength and intact sensation to bilateral lower extremities.  No gross focal neurologic deficits are appreciated.  Skin:  Skin is warm and dry.  Faint erythema to anterior right lower extremity as noted above. Psychiatric: Mood and affect are  normal. Speech and behavior are normal.  ____________________________________________   LABS (all labs ordered are listed, but only abnormal results are displayed)  Labs Reviewed  COMPREHENSIVE METABOLIC PANEL - Abnormal; Notable for the following components:      Result Value   Glucose, Bld 126 (*)    Total Protein 6.3 (*)    Albumin 3.3 (*)    Alkaline Phosphatase 165 (*)    All other components within normal limits  CBC WITH DIFFERENTIAL/PLATELET - Abnormal; Notable for the following components:   WBC 13.1 (*)    RBC 3.55 (*)    Hemoglobin 10.4 (*)    HCT 31.7 (*)    Neutro Abs 11.3 (*)    Abs Immature Granulocytes 0.13 (*)    All other components within normal limits  BRAIN NATRIURETIC PEPTIDE  LACTIC ACID, PLASMA  URINALYSIS, COMPLETE (UACMP) WITH MICROSCOPIC  LACTIC ACID, PLASMA  TROPONIN I (HIGH SENSITIVITY)  TROPONIN I (HIGH SENSITIVITY)   ____________________________________________  EKG   ____________________________________________  RADIOLOGY  US venous LE bilateral: No acute DVT CXR: No edema or focal infiltrate  ____________________________________________   PROCEDURES  Procedure(s) performed: No  Procedures  Critical Care performed: No ____________________________________________   INITIAL IMPRESSION / ASSESSMENT AND PLAN / ED COURSE  Pertinent labs & imaging results that were available during my care of the patient were reviewed by me and considered in my medical decision making (see chart for details).  78 year old male with PMH as noted above and status post a lumbar spinal fusion on 03/13/2019 presents with bilateral lower extremity swelling over the last several days which is worse on the right.  He feels like the swelling is going up at least to the hips.  He reports continued back pain since the surgery but states it has not worsened at all.  He denies any numbness or weakness.  I reviewed the past medical records in epic and confirmed  the history of the surgery.  The patient was discharged on 03/16/2019 in stable condition.  He was subsequently seen  in the ED on 03/17/2019 with urinary retention and had a negative lumbar MRI and Foley catheter was placed.  The patient has a follow-up appointment this week with urology.  On exam today the patient is overall relatively well-appearing and his vital signs are normal.  He has mild lower extremity edema which is slightly worse on the right and there is faint erythema to the anterior right lower extremity below the knee.  It also is warmer to palpation than the left.  He has normal motor strength and sensation bilaterally.  The remainder of the exam is unremarkable.  Lab work-up obtained from triage is significant for elevated WBC count but otherwise unremarkable.  BNP and troponin are both negative.  Ultrasound shows no evidence of DVT.  Given that there is no DVT, overall I suspect most likely cellulitis of the right lower extremity versus (or in addition to) dependent edema after surgery.  The patient has no symptoms of acute CHF and the lab work-up and chest x-ray are not suggestive of this.  There is no other obvious source of infection although given the recent Foley placement a UTI is also possible.  There is no evidence of postsurgical complication.  We will obtain urinalysis, lactic acid, and give Lasix and an antibiotic.  The patient would very much like to go home if at all possible, and I anticipate that if his lactic acid is normal he should be able to do so.  ----------------------------------------- 3:16 PM on 03/24/2019 -----------------------------------------  Lactic acid is normal.  We are awaiting the urinalysis.  If it appears consistent with acute infection, the patient may need his Foley catheter change.  Either way he will go home with a course of Keflex to treat for cellulitis.  The patient is comfortable with this plan.  I am signing him out to the oncoming  physician Dr. Jari Pigg. ____________________________________________   FINAL CLINICAL IMPRESSION(S) / ED DIAGNOSES  Final diagnoses:  Cellulitis of right lower extremity      NEW MEDICATIONS STARTED DURING THIS VISIT:  New Prescriptions   CEPHALEXIN (KEFLEX) 500 MG CAPSULE    Take 1 capsule (500 mg total) by mouth 4 (four) times daily for 7 days.     Note:  This document was prepared using Dragon voice recognition software and may include unintentional dictation errors.    Arta Silence, MD 03/24/19 1517

## 2019-03-24 NOTE — ED Triage Notes (Addendum)
Pt presents to ED via POV with c/o BLE swelling and back pain. Pt states D/C recently and started noticing leg swelling. Pt noted to be wearing back brace in triage, c/o back pain as well.   Pt states weight gain of 5lbs in 2 days. Pt denies SOB with weight gain.

## 2019-03-24 NOTE — ED Provider Notes (Signed)
3:10 PM assumed care for patient.   Recent spinal fusion came in with urinary retention last week. RLL cellulitis/edema. Plan to go home with keflex for cellulitis.  Pending UA. If concerning continue the kelfex but replace the catheter here.   White count was slightly elevated at 13.1 is most likely secondary to the cellulitis..  Urine without evidence of UTI. Troponins are stable.  Evaluate patient's leg.  No evidence of crepitus.  Not rapidly progressing.  Patient instructed that if the antibiotics are not working or symptoms are worsening that he should return to the ER.  I discussed the provisional nature of ED diagnosis, the treatment so far, the ongoing plan of care, follow up appointments and return precautions with the patient and any family or support people present. They expressed understanding and agreed with the plan, discharged home.        Vanessa Blomkest, MD 03/24/19 [phone]

## 2019-03-24 NOTE — ED Notes (Signed)
Pt provided with urinal and paced at bedside.

## 2019-03-24 NOTE — ED Notes (Signed)
Pt st he was taking abx for a "leg infection".

## 2019-03-24 NOTE — ED Notes (Signed)
Pt provided with blanket

## 2019-03-27 ENCOUNTER — Other Ambulatory Visit: Payer: Self-pay

## 2019-03-27 ENCOUNTER — Ambulatory Visit (INDEPENDENT_AMBULATORY_CARE_PROVIDER_SITE_OTHER): Payer: Medicare Other

## 2019-03-27 DIAGNOSIS — N138 Other obstructive and reflux uropathy: Secondary | ICD-10-CM

## 2019-03-27 DIAGNOSIS — N401 Enlarged prostate with lower urinary tract symptoms: Secondary | ICD-10-CM | POA: Diagnosis not present

## 2019-03-27 DIAGNOSIS — R339 Retention of urine, unspecified: Secondary | ICD-10-CM | POA: Diagnosis not present

## 2019-03-27 LAB — BLADDER SCAN AMB NON-IMAGING

## 2019-03-27 NOTE — Progress Notes (Signed)
Catheter Removal  Patient is present today for a catheter removal.  85ml of water was drained from the balloon. A 16FR Coude foley cath was removed from the bladder no complications were noted . Patient tolerated well.  Preformed by: Gordy Clement, [place] ([facility])  Follow up/ Additional notes: RTC this afternoon for PVR  Continuous Intermittent Catheterization  Due to urinary retention patient is present today for a teaching of self I & O Catheterization. Patient was given detailed verbal and printed instructions of self catheterization. Patient was cleaned and prepped in a sterile fashion.  With instruction and assistance patient inserted a 14FR Coude and urine return was noted 268ml, urine was dark yellow in color. Patient tolerated well, no complications were noted Patient was given a sample bag with supplies to take home.  Instructions were given per Larene Beach for patient to cath 2 times daily.    Preformed by: Gordy Clement, [place] ([facility])  Additional Notes: Pt returns to clinic and states that he has not been able to void since leaving the office this morning. Pt states that he has been pushing fluids, he estimates he has drank close to a gallon of water. PVR is 190mL. Per Larene Beach, pt taught how to self cath. Pt was able to cath himself successfully. Pt given samples of 14FR Coude x 1 week at which time he will RTC for follow up with provider.

## 2019-03-27 NOTE — Patient Instructions (Signed)

## 2019-03-28 ENCOUNTER — Emergency Department
Admission: EM | Admit: 2019-03-28 | Discharge: 2019-03-28 | Disposition: A | Discharge status: Home / Self Care | Payer: Medicare Other | Attending: Emergency Medicine | Admitting: Emergency Medicine

## 2019-03-28 ENCOUNTER — Encounter: Payer: Self-pay | Admitting: Emergency Medicine

## 2019-03-28 ENCOUNTER — Other Ambulatory Visit: Payer: Self-pay

## 2019-03-28 ENCOUNTER — Telehealth: Payer: Self-pay | Admitting: Urology

## 2019-03-28 DIAGNOSIS — I10 Essential (primary) hypertension: Secondary | ICD-10-CM | POA: Insufficient documentation

## 2019-03-28 DIAGNOSIS — Z79899 Other long term (current) drug therapy: Secondary | ICD-10-CM | POA: Diagnosis not present

## 2019-03-28 DIAGNOSIS — R339 Retention of urine, unspecified: Secondary | ICD-10-CM | POA: Insufficient documentation

## 2019-03-28 DIAGNOSIS — J45909 Unspecified asthma, uncomplicated: Secondary | ICD-10-CM | POA: Diagnosis not present

## 2019-03-28 DIAGNOSIS — Z96649 Presence of unspecified artificial hip joint: Secondary | ICD-10-CM | POA: Insufficient documentation

## 2019-03-28 DIAGNOSIS — Z87891 Personal history of nicotine dependence: Secondary | ICD-10-CM | POA: Insufficient documentation

## 2019-03-28 MED ORDER — LIDOCAINE HCL URETHRAL/MUCOSAL 2 % EX GEL
1.0000 "application " | Freq: Once | CUTANEOUS | Status: AC
  Administered 2019-03-28: 1 via URETHRAL

## 2019-03-28 MED ORDER — TAMSULOSIN HCL 0.4 MG PO CAPS: 0.4000 mg | ORAL_CAPSULE | Freq: Every day | ORAL | 0 refills | Status: DC

## 2019-03-28 NOTE — ED Triage Notes (Signed)
Patient ambulatory to triage with steady gait, without difficulty or distress noted; pt reports recent back surgery; seen Sunday for urinary retention and d/c with instructions to self-cath but unable to insert and has had bleeding since 3pm yesterday

## 2019-03-28 NOTE — Telephone Encounter (Signed)
Rx sent to pharmacy   

## 2019-03-28 NOTE — ED Provider Notes (Addendum)
[facility] Emergency Department Provider Note _   First MD Initiated Contact with Patient 03/28/19 0430     (approximate)  I have reviewed the triage vital signs and the nursing notes.   HISTORY  Chief Complaint Urinary Retention    HPI Stephen Cervantes is a 78 y.o. male with below list of previous medical conditions including  lumbar fusion on 05/08/9029 with complication of urinary retention following.  Patient was seen in the emergency department on 03/24/2019 secondary to urinary retention at which point he was catheterized.  Patient followed up with [facility] urology and was advised to do self-catheterization at home.  Patient presents emergency department tonight stating that he has been unable to catheterize himself since 3 PM yesterday.  Patient states he has had 4 attempts without any success.  Patient states that upon his last attempt he did notice some blood.        Past Medical History:  Diagnosis Date  . Arthritis   . Asthma   . Bowel obstruction ([place]) 0923,3007  . Depression   . Diverticulitis 2006  . Enlarged prostate   . Gallstones 07/04/2014  . History of gastrointestinal ulcer   . History of kidney stones   . HLD (hyperlipidemia)   . HTN (hypertension)   . Kidney stone    kidney stones  . Neuritis or radiculitis due to rupture of lumbar intervertebral disc 05/29/2015  . Thyroid disease     Patient Active Problem List   Diagnosis Date Noted  . S/P lumbar fusion 03/13/2019  . Kidney stones 08/26/2015  . Left ureteral stone 08/16/2015  . Hydronephrosis with urinary obstruction due to ureteral calculus 08/16/2015  . Microscopic hematuria 08/16/2015  . Neuritis or radiculitis due to rupture of lumbar intervertebral disc 05/29/2015  . Left flank pain 07/04/2014  . Gallstones 07/04/2014  . Benign prostatic hyperplasia with urinary obstruction 01/09/2013  . Calculi, ureter 09/13/2012  . Calculus of kidney 09/13/2012  . Frank  hematuria 09/13/2012  . 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 Renal colic 62/26/3335    Past Surgical History:  Procedure Laterality Date  . COLECTOMY  2006   Abdominal colectomy, ileostomy, Hartman procedure for perforation of cecum, sigmoid diverticulitis  . HERNIA REPAIR  2008?   [place]  . ILEOSTOMY  2007  . JOINT REPLACEMENT  2014   Hip Replacement Hessie Knows  . KNEE SURGERY Right 2011  . POSTERIOR LUMBAR FUSION 4 LEVEL N/A 03/13/2019   Procedure: POSTERIOR LUMBAR FUSION 4 LEVEL L1-5;  Surgeon: Meade Maw, MD;  Location: [facility] ORS;  Service: Neurosurgery;  Laterality: N/A;    Prior to Admission medications   Medication Sig Start Date End Date Taking? Authorizing Provider  acetaminophen (TYLENOL) 325 MG tablet Take 2 tablets (650 mg total) by mouth every 4 (four) hours as needed for mild pain ((score 1 to 3) or temp > 100.5). 03/16/19   Marin Olp, PA-C  atorvastatin (LIPITOR) 10 MG tablet Take 10 mg by mouth daily.     [provider]  Calcium-Magnesium-Vitamin D (CALCIUM 1200+D3 PO) Take 1 tablet by mouth 2 (two) times a day.    [provider]  celecoxib (CELEBREX) 200 MG capsule Take 1 capsule (200 mg total) by mouth daily. 03/16/19   Marin Olp, PA-C  cephALEXin (KEFLEX) 500 MG capsule Take 1 capsule (500 mg total) by mouth 4 (four) times daily for 7 days. 03/24/19 03/31/19  Arta Silence, MD  Glucosamine 500 MG CAPS Take 500 mg by mouth daily.    [provider]  hydrochlorothiazide (HYDRODIURIL) 25 MG tablet Take 25 mg by mouth daily.  06/26/14   [provider]  levothyroxine (SYNTHROID) 150 MCG tablet Take 150 mcg by mouth daily before breakfast.    [provider]  loperamide (IMODIUM) 2 MG capsule Take 4 mg by mouth 3 (three) times daily.     [provider]  loratadine (CLARITIN) 10 MG tablet Take 10 mg by mouth daily.    [provider]   Lutein 20 MG CAPS Take 20 mg by mouth daily.     [provider]  Magnesium 250 MG TABS Take 250 mg by mouth daily.    [provider]  montelukast (SINGULAIR) 10 MG tablet Take 10 mg by mouth daily.  06/26/14   [provider]  Multiple Vitamin (MULTIVITAMIN WITH MINERALS) TABS tablet Take 1 tablet by mouth daily.    [provider]  oxyCODONE 10 MG TABS Take 0.5-1 tablets (5-10 mg total) by mouth every 3 (three) hours as needed for severe pain ((score 7 to 10)). 03/16/19   Marin Olp, PA-C  pantoprazole (PROTONIX) 40 MG tablet Take 40 mg by mouth daily before breakfast.  08/09/18   [provider]  potassium chloride (K-DUR) 10 MEQ tablet Take 20 mEq by mouth 2 (two) times a day.  06/11/18   [provider]  sucralfate (CARAFATE) 1 g tablet Take 1 g by mouth 2 (two) times a day.     [provider]  tiZANidine (ZANAFLEX) 4 MG tablet Take 1 tablet (4 mg total) by mouth every 6 (six) hours as needed. 03/16/19   Marin Olp, PA-C  vitamin B-12 (CYANOCOBALAMIN) 500 MCG tablet Take 500 mcg by mouth daily.    [provider]  Zinc 50 MG TABS Take 50 mg by mouth daily.    [provider]    Allergies Morphine and related, Other, and Tape  Family History  Problem Relation Age of Onset  . Cancer Father        cancer of lung, brain  and lymphoma  . Parkinson's disease Brother   . Mesothelioma Brother   . Kidney disease Neg Hx   . Prostate cancer Neg Hx   . Kidney cancer Neg Hx   . Bladder Cancer Neg Hx     Social History Social History   Tobacco Use  . Smoking status: Former Smoker    Types: Cigarettes    Quit date: 1998    Years since quitting: 22.5  . Smokeless tobacco: Never Used  Substance Use Topics  . Alcohol use: No    Alcohol/week: 0.0 standard drinks  . Drug use: No    Review of Systems Constitutional: No fever/chills Eyes: No visual changes. ENT: No sore throat. Cardiovascular: Denies  chest pain. Respiratory: Denies shortness of breath. Gastrointestinal: No abdominal pain.  No nausea, no vomiting.  No diarrhea.  No constipation. Genitourinary: Negative for dysuria. Musculoskeletal: Negative for neck pain.  Negative for back pain. Integumentary: Negative for rash. Neurological: Negative for headaches, focal weakness or numbness.   ____________________________________________   PHYSICAL EXAM:  VITAL SIGNS: ED Triage Vitals  Enc Vitals Group     BP 03/28/19 0359 (!) 141/83     Pulse Rate 03/28/19 0359 88     Resp 03/28/19 0359 16     Temp 03/28/19 0359 98.8 F (37.1 C)     Temp Source 03/28/19 0359  Oral     SpO2 03/28/19 0359 98 %     Weight 03/28/19 0356 81.6 kg (180 lb)     Height 03/28/19 0356 1.778 m (5\' 10" )     Head Circumference --      Peak Flow --      Pain Score 03/28/19 0356 8     Pain Loc --      Pain Edu? --      Excl. in [place]? --     Constitutional: Alert and oriented.  Apparent discomfort. Eyes: Conjunctivae are normal.  Mouth/Throat: Mucous membranes are moist.  Oropharynx non-erythematous. Neck: No stridor.   Cardiovascular: Normal rate, regular rhythm. Good peripheral circulation. Grossly normal heart sounds. Respiratory: Normal respiratory effort.  No retractions. No audible wheezing. Gastrointestinal: Suprapubic distention and tenderness.  Musculoskeletal: No lower extremity tenderness nor edema. No gross deformities of extremities. Neurologic:  Normal speech and language. No gross focal neurologic deficits are appreciated.  Skin:  Skin is warm, dry and intact. No rash noted. Psychiatric: Mood and affect are normal. Speech and behavior are normal.    Procedures   ____________________________________________   INITIAL IMPRESSION / MDM / ASSESSMENT AND PLAN / ED COURSE  As part of my medical decision making, I reviewed the following data within the electronic MEDICAL RECORD NUMBER  78 year old male presented with above-stated  history and physical exam secondary to urinary retention.  I inserted Urojet and subsequently placed a coud catheter with 1200 mL's of urine obtained.  Patient's pain resolved.  Patient referred to [facility] urology for further outpatient evaluation and management  ____________________________________________  FINAL CLINICAL IMPRESSION(S) / ED DIAGNOSES  Final diagnoses:  Urinary retention     MEDICATIONS GIVEN DURING THIS VISIT:  Medications  lidocaine (XYLOCAINE) 2 % jelly 1 application (1 application Urethral Given 03/28/19 0448)     ED Discharge Orders    None      *Please note:  KENTA LASTER was evaluated in Emergency Department on 03/28/2019 for the symptoms described in the history of present illness. He was evaluated in the context of the global COVID-19 pandemic, which necessitated consideration that the patient might be at risk for infection with the SARS-CoV-2 virus that causes COVID-19. Institutional protocols and algorithms that pertain to the evaluation of patients at risk for COVID-19 are in a state of rapid change based on information released by regulatory bodies including the CDC and federal and state organizations. These policies and algorithms were followed during the patient's care in the ED.  Some ED evaluations and interventions may be delayed as a result of limited staffing during the pandemic.*  Note:  This document was prepared using Dragon voice recognition software and may include unintentional dictation errors.   Gregor Hams, MD 03/28/19 0631    Gregor Hams, MD 03/28/19 [phone]

## 2019-03-28 NOTE — Telephone Encounter (Signed)
Patient wants a script for flomax [facility]  [place] per Margretta Ditty

## 2019-03-29 ENCOUNTER — Ambulatory Visit: Payer: Medicare Other | Admitting: Urology

## 2019-04-03 ENCOUNTER — Ambulatory Visit: Payer: Medicare Other | Admitting: Urology

## 2019-04-08 NOTE — Progress Notes (Signed)
04/09/2019 9:56 AM   [name] 05/10/1941 [id number]  Referring provider: Jodi Marble, MD [place],  [place] [address]  Chief Complaint  Patient presents with  . Urinary Retention    HPI: Mr. Reagle is a 78 year old male with a history of hematuria, nephrolithiasis, BPH with LU TS and urinary retention who presents today for a trial of void.    Urinary retention Experienced after back surgery.  Presented to the ED on 07/12 and had Foley place for > 800cc of urine.  He presented to the office on 07/22 for a TOV and was instructed in CIC.  Failed 1 st TOV on 07/22 and had Foley placed for > 1000cc.  He is currently on Flomax.  He presents today for another attempt at a TOV.  He stated he attempted to self cath prior to seeking treatment in the emergency room on July 22.  He stated he tried 4 times to pass a catheter and when he started to see blood he immediately stopped.  Foley is in place today draining clear yellow urine.    History of hematuria Remote 20PY smoker.   Eval 2016 - CT Urogram - nephrolithiasis, cysto with very large prostate    Nephrolithiasis Pre 2016 - medicla passage x several, URS x 1 08/2015 - medical passage left 43mm ureteral stone, LLP 1cm non-obstructing as well. No right sided stones. Composition Uric Acid. UpH 5.5--> KCit 20 BID  KUB noted increased in size of bilateral stones Cr 1.6 KUB on 03/22/2018 noted nephrolithiasis, grossly stable. If further investigation desired, consider CT urography.  BPH WITH LUTS  (prostate and/or bladder) Previous IPSS score was 2/1. His major complaints today are urinary retention.  He denies any dysuria, hematuria or suprapubic pain.  He also denies any recent fevers, chills, nausea or vomiting.  PMH: Past Medical History:  Diagnosis Date  . Arthritis   . Asthma   . Bowel obstruction ([place]) 3329,5188  . Depression   . Diverticulitis 2006  . Enlarged prostate   . Gallstones 07/04/2014   . History of gastrointestinal ulcer   . History of kidney stones   . HLD (hyperlipidemia)   . HTN (hypertension)   . Kidney stone    kidney stones  . Neuritis or radiculitis due to rupture of lumbar intervertebral disc 05/29/2015  . Thyroid disease     Surgical History: Past Surgical History:  Procedure Laterality Date  . COLECTOMY  2006   Abdominal colectomy, ileostomy, Hartman procedure for perforation of cecum, sigmoid diverticulitis  . HERNIA REPAIR  2008?   [place]  . ILEOSTOMY  2007  . JOINT REPLACEMENT  2014   Hip Replacement Hessie Knows  . KNEE SURGERY Right 2011  . POSTERIOR LUMBAR FUSION 4 LEVEL N/A 03/13/2019   Procedure: POSTERIOR LUMBAR FUSION 4 LEVEL L1-5;  Surgeon: Meade Maw, MD;  Location: [facility] ORS;  Service: Neurosurgery;  Laterality: N/A;    Home Medications:  Allergies as of 04/09/2019      Reactions   Morphine And Related Other (See Comments)   Severe hallucinations   Other Other (See Comments)   Silk tape - Makes skin peel   Tape Other (See Comments)   Silk Tape per patient "peels off my skin"      Medication List       Accurate as of April 09, 2019 11:59 PM. If you have any questions, ask your nurse or doctor.  STOP taking these medications   CALCIUM 1200+D3 PO Stopped by: Tafari Humiston, PA-C   cephALEXin 500 MG capsule Commonly known as: KEFLEX Stopped by: Jessamy Torosyan, PA-C   Glucosamine 500 MG Caps Stopped by: Aracelis Ulrey, PA-C   Lutein 20 MG Caps Stopped by: Seba Madole, PA-C   Magnesium 250 MG Tabs Stopped by: Ethelda Deangelo, PA-C   multivitamin with minerals Tabs tablet Stopped by: Jamaree Hosier, PA-C   Oxycodone HCl 10 MG Tabs Stopped by: Idonia Zollinger, PA-C   Zinc 50 MG Tabs Stopped by: Libbi Towner, PA-C     TAKE these medications   acetaminophen 325 MG tablet Commonly known as: TYLENOL Take 2 tablets (650 mg total) by mouth every 4 (four) hours as needed  for mild pain ((score 1 to 3) or temp > 100.5).   atorvastatin 10 MG tablet Commonly known as: LIPITOR Take 10 mg by mouth daily.   celecoxib 200 MG capsule Commonly known as: CELEBREX Take 1 capsule (200 mg total) by mouth daily.   finasteride 5 MG tablet Commonly known as: Proscar Take 1 tablet (5 mg total) by mouth daily. Started by: Zara Council, PA-C   hydrochlorothiazide 25 MG tablet Commonly known as: HYDRODIURIL Take 25 mg by mouth daily.   levothyroxine 150 MCG tablet Commonly known as: SYNTHROID Take 150 mcg by mouth daily before breakfast.   loperamide 2 MG capsule Commonly known as: IMODIUM Take 4 mg by mouth 3 (three) times daily.   loratadine 10 MG tablet Commonly known as: CLARITIN Take 10 mg by mouth daily.   montelukast 10 MG tablet Commonly known as: SINGULAIR Take 10 mg by mouth daily.   pantoprazole 40 MG tablet Commonly known as: PROTONIX Take 40 mg by mouth daily before breakfast.   potassium chloride 10 MEQ tablet Commonly known as: K-DUR Take 20 mEq by mouth 2 (two) times a day.   potassium citrate 10 MEQ (1080 MG) SR tablet Commonly known as: UROCIT-K   sucralfate 1 g tablet Commonly known as: CARAFATE Take 1 g by mouth 2 (two) times a day.   tamsulosin 0.4 MG Caps capsule Commonly known as: FLOMAX Take 1 capsule (0.4 mg total) by mouth daily.   tiZANidine 4 MG tablet Commonly known as: ZANAFLEX Take 1 tablet (4 mg total) by mouth every 6 (six) hours as needed.   vitamin B-12 500 MCG tablet Commonly known as: CYANOCOBALAMIN Take 500 mcg by mouth daily.       Allergies:  Allergies  Allergen Reactions  . Morphine And Related Other (See Comments)    Severe hallucinations  . Other Other (See Comments)    Silk tape - Makes skin peel  . Tape Other (See Comments)    Silk Tape per patient "peels off my skin"    Family History: Family History  Problem Relation Age of Onset  . Cancer Father        cancer of lung, brain   and lymphoma  . Parkinson's disease Brother   . Mesothelioma Brother   . Kidney disease Neg Hx   . Prostate cancer Neg Hx   . Kidney cancer Neg Hx   . Bladder Cancer Neg Hx     Social History:  reports that he quit smoking about 22 years ago. His smoking use included cigarettes. He has never used smokeless tobacco. He reports that he does not drink alcohol or use drugs.  ROS: UROLOGY Frequent Urination?: No Hard to postpone urination?: No Burning/pain with urination?: No Get up  at night to urinate?: No Leakage of urine?: No Urine stream starts and stops?: No Trouble starting stream?: No Do you have to strain to urinate?: No Blood in urine?: No Urinary tract infection?: No Sexually transmitted disease?: No Injury to kidneys or bladder?: No Painful intercourse?: No Weak stream?: No Erection problems?: No Penile pain?: No  Gastrointestinal Nausea?: No Vomiting?: No Indigestion/heartburn?: No Diarrhea?: No Constipation?: No  Constitutional Fever: No Night sweats?: No Weight loss?: No Fatigue?: No  Skin Skin rash/lesions?: No Itching?: No  Eyes Blurred vision?: No Double vision?: No  Ears/Nose/Throat Sore throat?: No Sinus problems?: No  Hematologic/Lymphatic Swollen glands?: No Easy bruising?: No  Cardiovascular Leg swelling?: No Chest pain?: No  Respiratory Cough?: No Shortness of breath?: No  Endocrine Excessive thirst?: No  Musculoskeletal Back pain?: No Joint pain?: No  Neurological Headaches?: No Dizziness?: No  Psychologic Depression?: No Anxiety?: No  Physical Exam: BP 129/83 (BP Location: Right Arm, Patient Position: Sitting, Cuff Size: Normal)   Pulse 81   Ht 5\' 10"  (1.778 m)   Wt 175 lb (79.4 kg)   BMI 25.11 kg/m   Constitutional:  Well nourished. Alert and oriented, No acute distress. HEENT: [place] AT, moist mucus membranes.  Trachea midline, no masses. Cardiovascular: No clubbing, cyanosis, or edema. Respiratory: Normal  respiratory effort, no increased work of breathing. GI: Abdomen is soft, non tender, non distended, no abdominal masses. Liver and spleen not palpable.  No hernias appreciated.  Stool sample for occult testing is not indicated.   GU: No CVA tenderness.  No bladder fullness or masses.  Patient with circumcised phallus.   Urethral meatus is patent.  No penile discharge. No penile lesions or rashes. Scrotum without lesions, cysts, rashes and/or edema.  Testicles are located scrotally bilaterally. No masses are appreciated in the testicles. Left and right epididymis are normal. Rectal: Patient with  normal sphincter tone. Anus and perineum without scarring or rashes. No rectal masses are appreciated.  Prostate is approximately 60 grams, could only palpate the apex and the midportion of the gland, no nodules are appreciated. Seminal vesicles could not be palpated.   Skin: No rashes, bruises or suspicious lesions. Lymph: No inguinal adenopathy. Neurologic: Grossly intact, no focal deficits, moving all 4 extremities. Psychiatric: Normal mood and affect.  Procedure  Catheter Removal Patient is present today for a catheter removal.  10 ml of water was drained from the balloon. A 16 FR foley cath was removed from the bladder no complications were noted . Patient tolerated well.  Performed by: Zara Council, PA-C   Assessment & Plan:   1. Acute urinary retention: Foley catheter removed Voiding trial today   Return this afternoon for a PVR - PVR was found to be 110 mL  Follow-up in one month for I PSS score and PVR.    2. History of hematuria Hematuria work up completed 2017 - no malignancy is found No reports of gross hematuria RTC  in one year for symptom recheck  3. Bilateral nephrolithiasis Increasing in size KUB in one month Advised to contact our office or seek treatment in the ED if becomes febrile or pain/ vomiting are difficult control in order to arrange for emergent/urgent  intervention  4. BPH with LUTS Continue conservative management, avoiding bladder irritants and timed voiding's Continue the tamsulosin 0.4 mg daily and add finasteride 5 mg daily, The side effects of finasteride are discussed with the patient, such as: impotence, loss of interest in sex, or trouble having an orgasm;  abnormal ejaculation; swelling in your hands and/or feet; swelling and/or tenderness in your breasts.  RTC in 1 month for I PSS and PVR   Return in about 1 month (around 05/10/2019) for IPSS and PVR.  These notes generated with voice recognition software. I apologize for typographical errors.  Zara Council, PA-C  [facility] [address] [place], [place] [address] [phone]

## 2019-04-09 ENCOUNTER — Ambulatory Visit: Payer: Medicare Other | Admitting: Urology

## 2019-04-09 ENCOUNTER — Other Ambulatory Visit: Payer: Self-pay

## 2019-04-09 ENCOUNTER — Encounter: Payer: Self-pay | Admitting: Urology

## 2019-04-09 ENCOUNTER — Ambulatory Visit (INDEPENDENT_AMBULATORY_CARE_PROVIDER_SITE_OTHER): Payer: Medicare Other | Admitting: Urology

## 2019-04-09 VITALS — BP 129/83 | HR 81 | Ht 70.0 in | Wt 175.0 lb

## 2019-04-09 DIAGNOSIS — Z87448 Personal history of other diseases of urinary system: Secondary | ICD-10-CM | POA: Diagnosis not present

## 2019-04-09 DIAGNOSIS — R339 Retention of urine, unspecified: Secondary | ICD-10-CM | POA: Diagnosis not present

## 2019-04-09 DIAGNOSIS — N138 Other obstructive and reflux uropathy: Secondary | ICD-10-CM

## 2019-04-09 DIAGNOSIS — N200 Calculus of kidney: Secondary | ICD-10-CM | POA: Diagnosis not present

## 2019-04-09 DIAGNOSIS — N401 Enlarged prostate with lower urinary tract symptoms: Secondary | ICD-10-CM | POA: Diagnosis not present

## 2019-04-09 LAB — BLADDER SCAN AMB NON-IMAGING: Scan Result: 110

## 2019-04-09 MED ORDER — FINASTERIDE 5 MG PO TABS: 5.0000 mg | ORAL_TABLET | Freq: Every day | ORAL | 3 refills | Status: DC

## 2019-04-10 ENCOUNTER — Other Ambulatory Visit: Payer: Self-pay | Admitting: Urology

## 2019-04-10 ENCOUNTER — Encounter: Payer: Self-pay | Admitting: Urology

## 2019-04-10 ENCOUNTER — Ambulatory Visit (INDEPENDENT_AMBULATORY_CARE_PROVIDER_SITE_OTHER): Payer: Medicare Other | Admitting: Urology

## 2019-04-10 VITALS — BP 106/72 | HR 93 | Ht 70.0 in | Wt 175.0 lb

## 2019-04-10 DIAGNOSIS — R300 Dysuria: Secondary | ICD-10-CM | POA: Diagnosis not present

## 2019-04-10 DIAGNOSIS — N138 Other obstructive and reflux uropathy: Secondary | ICD-10-CM

## 2019-04-10 DIAGNOSIS — N401 Enlarged prostate with lower urinary tract symptoms: Secondary | ICD-10-CM

## 2019-04-10 DIAGNOSIS — R339 Retention of urine, unspecified: Secondary | ICD-10-CM

## 2019-04-10 DIAGNOSIS — R350 Frequency of micturition: Secondary | ICD-10-CM

## 2019-04-10 LAB — URINALYSIS, COMPLETE
Bilirubin, UA: NEGATIVE
Glucose, UA: NEGATIVE
Ketones, UA: NEGATIVE
Nitrite, UA: POSITIVE — AB
Specific Gravity, UA: 1.025 (ref 1.005–1.030)
Urobilinogen, Ur: 0.2 mg/dL (ref 0.2–1.0)
pH, UA: 7 (ref 5.0–7.5)

## 2019-04-10 LAB — MICROSCOPIC EXAMINATION
Epithelial Cells (non renal): NONE SEEN /hpf (ref 0–10)
WBC, UA: >30 /hpf — AB (ref 0–5)

## 2019-04-10 LAB — BLADDER SCAN AMB NON-IMAGING: Scan Result: 221

## 2019-04-10 MED ORDER — SULFAMETHOXAZOLE-TRIMETHOPRIM 800-160 MG PO TABS: 1.0000 | ORAL_TABLET | Freq: Two times a day (BID) | ORAL | 0 refills | Status: DC

## 2019-04-10 NOTE — Progress Notes (Signed)
04/10/2019 4:02 PM   Stephen Cervantes [birth year] [id number]  Referring provider: Jodi Marble, MD [place],  [place] [address]  Chief Complaint  Patient presents with  . Urinary Frequency    HPI: Stephen Cervantes is a 78 year old male with a history of hematuria, nephrolithiasis, BPH with LU TS and urinary retention who presents today complaining of difficulty with urination.    Urinary retention Experienced after back surgery.  Presented to the ED on 07/12 and had Foley place for > 800cc of urine.  He presented to the office on 07/22 for a TOV and was instructed in CIC.  Failed 1 st TOV on 07/22 and had Foley placed for > 1000cc.  He is currently on Flomax.  He presents today for another attempt at a TOV.  He stated he attempted to self cath prior to seeking treatment in the emergency room on July 22.  He stated he tried 4 times to pass a catheter and when he started to see blood he immediately stopped.  Foley catheter was removed 04/09/2019.  He returned that afternoon for a bladder scan.  His PVR was 110 mL.    He presents today with a complaint of urgency, dysuria, nocturia and a weak urinary stream.  He states he was up last night about every 45 minutes to urinate.  Patient denies any gross hematuria or suprapubic/flank pain.  Patient denies any fevers, chills, nausea or vomiting.   He has not noted anything that makes the symptoms worse.  He has not noted anything that makes the symptoms better.   His PVR is 221 mL.  His UA is nitrate positive with greater than 30 WBCs, 11-30 RBCs and many bacteria.    History of hematuria Remote 20PY smoker.   Eval 2016 - CT Urogram - nephrolithiasis, cysto with very large prostate.  UA with 11-30 RBCs, most likely due to infection.  Nephrolithiasis Pre 2016 - medicla passage x several, URS x 1 08/2015 - medical passage left 67mm ureteral stone, LLP 1cm non-obstructing as well. No right sided stones. Composition Uric Acid. UpH  5.5--> KCit 20 BID  KUB noted increased in size of bilateral stones Cr 1.6 KUB on 03/22/2018 noted nephrolithiasis, grossly stable. If further investigation desired, consider CT urography.  BPH WITH LUTS  (prostate and/or bladder) Previous IPSS score was 2/1. His major complaints today are urinary retention.  He denies any dysuria, hematuria or suprapubic pain.  He also denies any recent fevers, chills, nausea or vomiting.  PMH: Past Medical History:  Diagnosis Date  . Arthritis   . Asthma   . Bowel obstruction ([place]) 9485,4627  . Depression   . Diverticulitis 2006  . Enlarged prostate   . Gallstones 07/04/2014  . History of gastrointestinal ulcer   . History of kidney stones   . HLD (hyperlipidemia)   . HTN (hypertension)   . Kidney stone    kidney stones  . Neuritis or radiculitis due to rupture of lumbar intervertebral disc 05/29/2015  . Thyroid disease     Surgical History: Past Surgical History:  Procedure Laterality Date  . COLECTOMY  2006   Abdominal colectomy, ileostomy, Hartman procedure for perforation of cecum, sigmoid diverticulitis  . HERNIA REPAIR  2008?   [place]  . ILEOSTOMY  2007  . JOINT REPLACEMENT  2014   Hip Replacement Stephen Cervantes  . KNEE SURGERY Right 2011  . POSTERIOR LUMBAR FUSION 4 LEVEL N/A 03/13/2019  Procedure: POSTERIOR LUMBAR FUSION 4 LEVEL L1-5;  Surgeon: Meade Maw, MD;  Location: [facility] ORS;  Service: Neurosurgery;  Laterality: N/A;    Home Medications:  Allergies as of 04/10/2019      Reactions   Morphine And Related Other (See Comments)   Severe hallucinations   Other Other (See Comments)   Silk tape - Makes skin peel   Tape Other (See Comments)   Silk Tape per patient "peels off my skin"      Medication List       Accurate as of April 10, 2019  4:02 PM. If you have any questions, ask your nurse or doctor.        acetaminophen 325 MG tablet Commonly known as: TYLENOL Take 2 tablets (650 mg  total) by mouth every 4 (four) hours as needed for mild pain ((score 1 to 3) or temp > 100.5).   atorvastatin 10 MG tablet Commonly known as: LIPITOR Take 10 mg by mouth daily.   celecoxib 200 MG capsule Commonly known as: CELEBREX Take 1 capsule (200 mg total) by mouth daily.   finasteride 5 MG tablet Commonly known as: Proscar Take 1 tablet (5 mg total) by mouth daily.   hydrochlorothiazide 25 MG tablet Commonly known as: HYDRODIURIL Take 25 mg by mouth daily.   levothyroxine 150 MCG tablet Commonly known as: SYNTHROID Take 150 mcg by mouth daily before breakfast.   loperamide 2 MG capsule Commonly known as: IMODIUM Take 4 mg by mouth 3 (three) times daily.   loratadine 10 MG tablet Commonly known as: CLARITIN Take 10 mg by mouth daily.   montelukast 10 MG tablet Commonly known as: SINGULAIR Take 10 mg by mouth daily.   pantoprazole 40 MG tablet Commonly known as: PROTONIX Take 40 mg by mouth daily before breakfast.   potassium chloride 10 MEQ tablet Commonly known as: K-DUR Take 20 mEq by mouth 2 (two) times a day.   potassium citrate 10 MEQ (1080 MG) SR tablet Commonly known as: UROCIT-K   sucralfate 1 g tablet Commonly known as: CARAFATE Take 1 g by mouth 2 (two) times a day.   sulfamethoxazole-trimethoprim 800-160 MG tablet Commonly known as: BACTRIM DS Take 1 tablet by mouth every 12 (twelve) hours. Started by: Stephen Council, PA-C   tamsulosin 0.4 MG Caps capsule Commonly known as: FLOMAX Take 1 capsule (0.4 mg total) by mouth daily.   tiZANidine 4 MG tablet Commonly known as: ZANAFLEX Take 1 tablet (4 mg total) by mouth every 6 (six) hours as needed.   vitamin B-12 500 MCG tablet Commonly known as: CYANOCOBALAMIN Take 500 mcg by mouth daily.       Allergies:  Allergies  Allergen Reactions  . Morphine And Related Other (See Comments)    Severe hallucinations  . Other Other (See Comments)    Silk tape - Makes skin peel  . Tape Other  (See Comments)    Silk Tape per patient "peels off my skin"    Family History: Family History  Problem Relation Age of Onset  . Cancer Father        cancer of lung, brain  and lymphoma  . Parkinson's disease Brother   . Mesothelioma Brother   . Kidney disease Neg Hx   . Prostate cancer Neg Hx   . Kidney cancer Neg Hx   . Bladder Cancer Neg Hx     Social History:  reports that he quit smoking about 22 years ago. His smoking use included cigarettes. He  has never used smokeless tobacco. He reports that he does not drink alcohol or use drugs.  ROS: UROLOGY Frequent Urination?: No Hard to postpone urination?: Yes Burning/pain with urination?: Yes Get up at night to urinate?: Yes Leakage of urine?: No Urine stream starts and stops?: No Trouble starting stream?: No Do you have to strain to urinate?: No Blood in urine?: No Urinary tract infection?: No Sexually transmitted disease?: No Injury to kidneys or bladder?: No Painful intercourse?: No Weak stream?: Yes Erection problems?: No Penile pain?: No  Gastrointestinal Nausea?: No Vomiting?: No Indigestion/heartburn?: No Diarrhea?: No Constipation?: No  Constitutional Fever: No Night sweats?: No Weight loss?: No Fatigue?: No  Skin Skin rash/lesions?: No Itching?: No  Eyes Blurred vision?: No Double vision?: No  Ears/Nose/Throat Sore throat?: No Sinus problems?: No  Hematologic/Lymphatic Swollen glands?: No Easy bruising?: No  Cardiovascular Leg swelling?: No Chest pain?: No  Respiratory Cough?: No Shortness of breath?: No  Endocrine Excessive thirst?: No  Musculoskeletal Back pain?: Yes Joint pain?: No  Neurological Headaches?: No Dizziness?: No  Psychologic Depression?: No Anxiety?: No  Physical Exam: BP 106/72 (BP Location: Left Arm, Patient Position: Sitting, Cuff Size: Normal)   Pulse 93   Ht 5\' 10"  (1.778 m)   Wt 175 lb (79.4 kg)   BMI 25.11 kg/m   Constitutional:  Well  nourished. Alert and oriented, No acute distress. HEENT: [place] AT, moist mucus membranes.  Trachea midline, no masses. Cardiovascular: No clubbing, cyanosis, or edema. Respiratory: Normal respiratory effort, no increased work of breathing. Neurologic: Grossly intact, no focal deficits, moving all 4 extremities. Psychiatric: Normal mood and affect.  Assessment & Plan:   1. History of urinary retention: Resolving   2. History of hematuria Hematuria work up completed 2017 - no malignancy is found  3. Bilateral nephrolithiasis Increasing in size KUB in one month Advised to contact our office or seek treatment in the ED if becomes febrile or pain/ vomiting are difficult control in order to arrange for emergent/urgent intervention  4. BPH with LUTS Continue conservative management, avoiding bladder irritants and timed voiding's Continue the tamsulosin 0.4 mg daily and finasteride 5 mg daily RTC in 1 month for I PSS and PVR   5. UTI UA is suspicious for infection, will send for culture and patient is started on Septra DS His urinary symptoms are likely due to the infection and will reassess in 1 month  Return for keep follow up .  These notes generated with voice recognition software. I apologize for typographical errors.  Stephen Council, PA-C  [facility] [address] [place], [place] [address] [phone]

## 2019-04-14 ENCOUNTER — Inpatient Hospital Stay: Payer: Medicare Other

## 2019-04-14 ENCOUNTER — Inpatient Hospital Stay
Admission: EM | Admit: 2019-04-14 | Discharge: 2019-04-17 | DRG: 390 | Disposition: A | Discharge status: Home / Self Care | Payer: Medicare Other | Attending: General Surgery | Admitting: General Surgery

## 2019-04-14 ENCOUNTER — Encounter: Payer: Self-pay | Admitting: Emergency Medicine

## 2019-04-14 ENCOUNTER — Emergency Department: Payer: Medicare Other

## 2019-04-14 ENCOUNTER — Other Ambulatory Visit: Payer: Self-pay

## 2019-04-14 DIAGNOSIS — E860 Dehydration: Secondary | ICD-10-CM | POA: Diagnosis present

## 2019-04-14 DIAGNOSIS — Z96641 Presence of right artificial hip joint: Secondary | ICD-10-CM | POA: Diagnosis present

## 2019-04-14 DIAGNOSIS — E785 Hyperlipidemia, unspecified: Secondary | ICD-10-CM | POA: Diagnosis present

## 2019-04-14 DIAGNOSIS — Z8711 Personal history of peptic ulcer disease: Secondary | ICD-10-CM

## 2019-04-14 DIAGNOSIS — K5651 Intestinal adhesions [bands], with partial obstruction: Principal | ICD-10-CM | POA: Diagnosis present

## 2019-04-14 DIAGNOSIS — Z87442 Personal history of urinary calculi: Secondary | ICD-10-CM | POA: Diagnosis not present

## 2019-04-14 DIAGNOSIS — Z20828 Contact with and (suspected) exposure to other viral communicable diseases: Secondary | ICD-10-CM | POA: Diagnosis present

## 2019-04-14 DIAGNOSIS — K56609 Unspecified intestinal obstruction, unspecified as to partial versus complete obstruction: Secondary | ICD-10-CM

## 2019-04-14 DIAGNOSIS — Z23 Encounter for immunization: Secondary | ICD-10-CM | POA: Diagnosis not present

## 2019-04-14 DIAGNOSIS — Z801 Family history of malignant neoplasm of trachea, bronchus and lung: Secondary | ICD-10-CM | POA: Diagnosis not present

## 2019-04-14 DIAGNOSIS — Z820 Family history of epilepsy and other diseases of the nervous system: Secondary | ICD-10-CM

## 2019-04-14 DIAGNOSIS — Z87891 Personal history of nicotine dependence: Secondary | ICD-10-CM | POA: Diagnosis not present

## 2019-04-14 DIAGNOSIS — R112 Nausea with vomiting, unspecified: Secondary | ICD-10-CM

## 2019-04-14 DIAGNOSIS — Z8673 Personal history of transient ischemic attack (TIA), and cerebral infarction without residual deficits: Secondary | ICD-10-CM | POA: Diagnosis not present

## 2019-04-14 DIAGNOSIS — Z807 Family history of other malignant neoplasms of lymphoid, hematopoietic and related tissues: Secondary | ICD-10-CM | POA: Diagnosis not present

## 2019-04-14 DIAGNOSIS — Z981 Arthrodesis status: Secondary | ICD-10-CM | POA: Diagnosis not present

## 2019-04-14 DIAGNOSIS — J45909 Unspecified asthma, uncomplicated: Secondary | ICD-10-CM | POA: Diagnosis present

## 2019-04-14 DIAGNOSIS — Z9049 Acquired absence of other specified parts of digestive tract: Secondary | ICD-10-CM

## 2019-04-14 DIAGNOSIS — I10 Essential (primary) hypertension: Secondary | ICD-10-CM | POA: Diagnosis present

## 2019-04-14 DIAGNOSIS — K56600 Partial intestinal obstruction, unspecified as to cause: Secondary | ICD-10-CM | POA: Diagnosis present

## 2019-04-14 LAB — URINALYSIS, COMPLETE (UACMP) WITH MICROSCOPIC
Bacteria, UA: NONE SEEN
Bilirubin Urine: NEGATIVE
Glucose, UA: NEGATIVE mg/dL
Ketones, ur: NEGATIVE mg/dL
Leukocytes,Ua: NEGATIVE
Nitrite: NEGATIVE
Protein, ur: NEGATIVE mg/dL
Specific Gravity, Urine: 1.012 (ref 1.005–1.030)
Squamous Epithelial / HPF: NONE SEEN (ref 0–5)
WBC, UA: NONE SEEN WBC/hpf (ref 0–5)
pH: 6 (ref 5.0–8.0)

## 2019-04-14 LAB — CBC
HCT: 37.4 % — ABNORMAL LOW (ref 39.0–52.0)
Hemoglobin: 12 g/dL — ABNORMAL LOW (ref 13.0–17.0)
MCH: 27.6 pg (ref 26.0–34.0)
MCHC: 32.1 g/dL (ref 30.0–36.0)
MCV: 86 fL (ref 80.0–100.0)
Platelets: 188 10*3/uL (ref 150–400)
RBC: 4.35 MIL/uL (ref 4.22–5.81)
RDW: 13.3 % (ref 11.5–15.5)
WBC: 3.6 10*3/uL — ABNORMAL LOW (ref 4.0–10.5)
nRBC: 0 % (ref 0.0–0.2)

## 2019-04-14 LAB — COMPREHENSIVE METABOLIC PANEL
ALT: 19 U/L (ref 0–44)
AST: 29 U/L (ref 15–41)
Albumin: 4 g/dL (ref 3.5–5.0)
Alkaline Phosphatase: 118 U/L (ref 38–126)
Anion gap: 8 (ref 5–15)
BUN: 18 mg/dL (ref 8–23)
CO2: 24 mmol/L (ref 22–32)
Calcium: 9.4 mg/dL (ref 8.9–10.3)
Chloride: 105 mmol/L (ref 98–111)
Creatinine, Ser: 1.28 mg/dL — ABNORMAL HIGH (ref 0.61–1.24)
GFR calc Af Amer: >60 mL/min (ref >60)
GFR calc non Af Amer: 54 mL/min — ABNORMAL LOW (ref >60)
Glucose, Bld: 113 mg/dL — ABNORMAL HIGH (ref 70–99)
Potassium: 4.4 mmol/L (ref 3.5–5.1)
Sodium: 137 mmol/L (ref 135–145)
Total Bilirubin: 0.6 mg/dL (ref 0.3–1.2)
Total Protein: 6.9 g/dL (ref 6.5–8.1)

## 2019-04-14 LAB — LIPASE, BLOOD: Lipase: 34 U/L (ref 11–51)

## 2019-04-14 MED ORDER — LEVOTHYROXINE SODIUM 100 MCG/5ML IV SOLN
75.0000 ug | Freq: Every day | INTRAVENOUS | Status: DC
  Administered 2019-04-15 – 2019-04-17 (×3): 75 ug via INTRAVENOUS
  Filled 2019-04-14 (×3): qty 5

## 2019-04-14 MED ORDER — PANTOPRAZOLE SODIUM 40 MG IV SOLR
40.0000 mg | INTRAVENOUS | Status: DC
  Administered 2019-04-14 – 2019-04-16 (×3): 40 mg via INTRAVENOUS
  Filled 2019-04-14 (×3): qty 40

## 2019-04-14 MED ORDER — KETOROLAC TROMETHAMINE 15 MG/ML IJ SOLN
15.0000 mg | Freq: Four times a day (QID) | INTRAMUSCULAR | Status: DC | PRN
  Administered 2019-04-14 – 2019-04-15 (×2): 15 mg via INTRAVENOUS
  Filled 2019-04-14 (×2): qty 1

## 2019-04-14 MED ORDER — SULFAMETHOXAZOLE-TRIMETHOPRIM 800-160 MG PO TABS
1.0000 | ORAL_TABLET | Freq: Two times a day (BID) | ORAL | Status: DC
  Administered 2019-04-14 – 2019-04-17 (×6): 1 via ORAL
  Filled 2019-04-14 (×8): qty 1

## 2019-04-14 MED ORDER — ENOXAPARIN SODIUM 40 MG/0.4ML ~~LOC~~ SOLN
40.0000 mg | SUBCUTANEOUS | Status: DC
  Administered 2019-04-14 – 2019-04-16 (×3): 40 mg via SUBCUTANEOUS
  Filled 2019-04-14 (×3): qty 0.4

## 2019-04-14 MED ORDER — FINASTERIDE 5 MG PO TABS
5.0000 mg | ORAL_TABLET | Freq: Every day | ORAL | Status: DC
  Administered 2019-04-14 – 2019-04-16 (×3): 5 mg via ORAL
  Filled 2019-04-14 (×4): qty 1

## 2019-04-14 MED ORDER — ONDANSETRON 4 MG PO TBDP: 4.0000 mg | ORAL_TABLET | Freq: Four times a day (QID) | ORAL | Status: DC | PRN

## 2019-04-14 MED ORDER — ACETAMINOPHEN 325 MG PO TABS
650.0000 mg | ORAL_TABLET | Freq: Four times a day (QID) | ORAL | Status: DC | PRN
  Filled 2019-04-14: qty 2

## 2019-04-14 MED ORDER — IOHEXOL 240 MG/ML SOLN
50.0000 mL | Freq: Once | INTRAMUSCULAR | Status: AC
  Administered 2019-04-14: 50 mL via ORAL

## 2019-04-14 MED ORDER — ONDANSETRON HCL 4 MG/2ML IJ SOLN: 4.0000 mg | Freq: Four times a day (QID) | INTRAMUSCULAR | Status: DC | PRN

## 2019-04-14 MED ORDER — SODIUM CHLORIDE 0.9% FLUSH: 3.0000 mL | Freq: Once | INTRAVENOUS | Status: DC

## 2019-04-14 MED ORDER — IOHEXOL 300 MG/ML  SOLN
100.0000 mL | Freq: Once | INTRAMUSCULAR | Status: AC | PRN
  Administered 2019-04-14: 100 mL via INTRAVENOUS

## 2019-04-14 MED ORDER — ACETAMINOPHEN 650 MG RE SUPP: 650.0000 mg | Freq: Four times a day (QID) | RECTAL | Status: DC | PRN

## 2019-04-14 MED ORDER — TIZANIDINE HCL 4 MG PO TABS
4.0000 mg | ORAL_TABLET | Freq: Four times a day (QID) | ORAL | Status: DC | PRN
  Filled 2019-04-14: qty 1

## 2019-04-14 MED ORDER — TAMSULOSIN HCL 0.4 MG PO CAPS
0.4000 mg | ORAL_CAPSULE | Freq: Every day | ORAL | Status: DC
  Administered 2019-04-14 – 2019-04-16 (×3): 0.4 mg via ORAL
  Filled 2019-04-14 (×4): qty 1

## 2019-04-14 MED ORDER — KCL IN DEXTROSE-NACL 40-5-0.45 MEQ/L-%-% IV SOLN
INTRAVENOUS | Status: DC
  Administered 2019-04-14 – 2019-04-17 (×7): via INTRAVENOUS
  Filled 2019-04-14 (×9): qty 1000

## 2019-04-14 MED ORDER — SODIUM CHLORIDE 0.9 % IV BOLUS
1000.0000 mL | Freq: Once | INTRAVENOUS | Status: AC
  Administered 2019-04-14: 1000 mL via INTRAVENOUS

## 2019-04-14 MED ORDER — ONDANSETRON HCL 4 MG/2ML IJ SOLN
4.0000 mg | Freq: Once | INTRAMUSCULAR | Status: AC
  Administered 2019-04-14: 4 mg via INTRAVENOUS
  Filled 2019-04-14: qty 2

## 2019-04-14 NOTE — ED Notes (Addendum)
Warm blanket given to pt. Instructed to let RN know when done with contrast or if is unable to finish.

## 2019-04-14 NOTE — ED Triage Notes (Signed)
Pt to ED via POV, pt states that he is having abdominal pain and abdominal distention. Pt reports that he had a colectomy 13 years ago, typically has 12 BMs a day but has not had one since yesterday. Pt reports nausea without vomiting. Pt is in NAD at this time.

## 2019-04-14 NOTE — ED Notes (Signed)
FIRST NURSE NOTE:  Pt has had bowel surgeries, reports no BM in 18 hours, c/o abd distention. Pt also reports weakness since this morning.

## 2019-04-14 NOTE — H&P (Signed)
Subjective:   Patient is a 78 y.o. male status post colectomy in 2409 due complications from perforated diverticulitis, who presents with concerns for small bowel obstruction.  Patient states he typically has 12 bowel movements a day, but has not had one since yesterday evening.  He does not feel he has passed flatus either.  He reports nausea without vomiting.  No fevers.  Was otherwise feeling well prior to today.  Patient Active Problem List   Diagnosis Date Noted  . Partial small bowel obstruction ([place]) 04/14/2019  . S/P lumbar fusion 03/13/2019  . Acquired trigger finger 06/28/2018  . Epigastric pain 05/22/2017  . History of duodenal ulcer 05/22/2017  . S/P colectomy 05/22/2017  . Transient ischemic attack (TIA) 01/31/2017  . Kidney stones 08/26/2015  . Left ureteral stone 08/16/2015  . Hydronephrosis with urinary obstruction due to ureteral calculus 08/16/2015  . Microscopic hematuria 08/16/2015  . Neuritis or radiculitis due to rupture of lumbar intervertebral disc 05/29/2015  . Left flank pain 07/04/2014  . Gallstones 07/04/2014  . Benign prostatic hyperplasia with urinary obstruction 01/09/2013  . Benign localized hyperplasia of prostate with urinary obstruction and other lower urinary tract symptoms (LUTS)(600.21) 01/09/2013  . Calculi, ureter 09/13/2012  . Calculus of kidney 09/13/2012  . Frank hematuria 09/13/2012  . 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 Renal colic 73/53/2992  . Essential hypertension 07/28/2008   Past Medical History:  Diagnosis Date  . Arthritis   . Asthma   . Bowel obstruction ([place]) [id number]  . Depression   . Diverticulitis 2006  . Enlarged prostate   . Gallstones 07/04/2014  . History of gastrointestinal ulcer   . History of kidney stones   . HLD (hyperlipidemia)   . HTN (hypertension)   . Kidney stone    kidney stones  . Neuritis or radiculitis due to rupture of lumbar intervertebral disc 05/29/2015  . Thyroid  disease     Past Surgical History:  Procedure Laterality Date  . COLECTOMY  2006   Abdominal colectomy, ileostomy, Hartman procedure for perforation of cecum, sigmoid diverticulitis  . HERNIA REPAIR  2008?   [place]  . ILEOSTOMY  2007  . JOINT REPLACEMENT  2014   Hip Replacement Hessie Knows  . KNEE SURGERY Right 2011  . POSTERIOR LUMBAR FUSION 4 LEVEL N/A 03/13/2019   Procedure: POSTERIOR LUMBAR FUSION 4 LEVEL L1-5;  Surgeon: Meade Maw, MD;  Location: [facility] ORS;  Service: Neurosurgery;  Laterality: N/A;    Medications Prior to Admission  Medication Sig Dispense Refill Last Dose  . acetaminophen (TYLENOL) 325 MG tablet Take 2 tablets (650 mg total) by mouth every 4 (four) hours as needed for mild pain ((score 1 to 3) or temp > 100.5). 100 tablet 0 Unknown at PRN  . atorvastatin (LIPITOR) 10 MG tablet Take 10 mg by mouth daily.    04/13/2019 at Unknown time  . celecoxib (CELEBREX) 200 MG capsule Take 1 capsule (200 mg total) by mouth daily. 60 capsule 0 04/13/2019 at Unknown time  . finasteride (PROSCAR) 5 MG tablet Take 1 tablet (5 mg total) by mouth daily. 90 tablet 3 04/13/2019 at Unknown time  . hydrochlorothiazide (HYDRODIURIL) 25 MG tablet Take 25 mg by mouth daily.    04/13/2019 at Unknown time  . levothyroxine (SYNTHROID) 150 MCG tablet Take 150 mcg by mouth daily before breakfast.   04/13/2019 at Unknown time  . loperamide (IMODIUM) 2 MG capsule Take 4 mg by mouth 3 (  three) times daily as needed for diarrhea or loose stools.    Unknown at PRN  . loratadine (CLARITIN) 10 MG tablet Take 10 mg by mouth daily as needed for allergies.    Unknown at PRN  . montelukast (SINGULAIR) 10 MG tablet Take 10 mg by mouth daily.    04/13/2019 at Unknown time  . pantoprazole (PROTONIX) 40 MG tablet Take 40 mg by mouth daily before breakfast.   1 04/13/2019 at Unknown time  . potassium citrate (UROCIT-K) 10 MEQ (1080 MG) SR tablet Take 20 mEq by mouth 2 (two) times daily.     04/13/2019 at Unknown time  . sucralfate (CARAFATE) 1 g tablet Take 1 g by mouth 2 (two) times a day.    04/13/2019 at Unknown time  . sulfamethoxazole-trimethoprim (BACTRIM DS) 800-160 MG tablet Take 1 tablet by mouth every 12 (twelve) hours. 14 tablet 0 04/13/2019 at Unknown time  . tamsulosin (FLOMAX) 0.4 MG CAPS capsule Take 1 capsule (0.4 mg total) by mouth daily. 30 capsule 0 04/13/2019 at Unknown time  . tiZANidine (ZANAFLEX) 4 MG tablet Take 1 tablet (4 mg total) by mouth every 6 (six) hours as needed. (Patient taking differently: Take 4 mg by mouth every 6 (six) hours as needed for muscle spasms. ) 60 tablet 0 Unknown at PRN  . vitamin B-12 (CYANOCOBALAMIN) 500 MCG tablet Take 500 mcg by mouth daily.   04/13/2019 at Unknown time   Allergies  Allergen Reactions  . Morphine And Related Other (See Comments)    Severe hallucinations  . Other Other (See Comments)    Silk tape - Makes skin peel  . Tape Other (See Comments)    Silk Tape per patient "peels off my skin"    Social History   Tobacco Use  . Smoking status: Former Smoker    Types: Cigarettes    Quit date: 1998    Years since quitting: 22.6  . Smokeless tobacco: Never Used  Substance Use Topics  . Alcohol use: No    Alcohol/week: 0.0 standard drinks    Family History  Problem Relation Age of Onset  . Cancer Father        cancer of lung, brain  and lymphoma  . Parkinson's disease Brother   . Mesothelioma Brother   . Kidney disease Neg Hx   . Prostate cancer Neg Hx   . Kidney cancer Neg Hx   . Bladder Cancer Neg Hx     Review of Systems Pertinent items noted in HPI and remainder of comprehensive ROS otherwise negative.  Objective:   Patient Vitals for the past 8 hrs:  BP Temp Temp src Pulse Resp SpO2 Height Weight  04/14/19 1528 139/82 98.2 F (36.8 C) Oral 80 16 99 % - -  04/14/19 1500 (!) 172/102 - - 85 - 96 % - -  04/14/19 1430 (!) 168/103 - - 80 17 96 % - -  04/14/19 1330 (!) 141/83 - - 76 18 95 % - -  04/14/19  1230 (!) 149/89 - - 80 18 98 % - -  04/14/19 1130 (!) 145/91 - - 70 16 98 % - -  04/14/19 1026 - - - - - - 5\' 10"  (1.778 m) 81.6 kg  04/14/19 1025 139/73 98.2 F (36.8 C) Oral 74 16 96 % - -   No intake/output data recorded. Total I/O In: 1046.1 [I.V.:46.1; IV Piggyback:1000] Out: -     General appearance: alert, cooperative, appears stated age and no distress Head:  Normocephalic, without obvious abnormality, atraumatic Eyes: negative, conjunctivae/corneas clear. PERRL, EOM's intact. Nose: Nares normal.No drainage or sinus tenderness., no discharge Throat: lips, mucosa, and tongue normal; teeth and gums normal Lungs: clear to auscultation bilaterally Heart: regular rate and rhythm Abdomen: Abdomen is soft but somewhat distended.  Has multiple surgical scars from prior operations. Extremities: extremities normal, atraumatic, no cyanosis or edema Skin: Skin color, texture, turgor normal. No rashes or lesions  Data Review CBC:  Lab Results  Component Value Date   WBC 3.6 (L) 04/14/2019   RBC 4.35 04/14/2019   BMP:  Lab Results  Component Value Date   GLUCOSE 113 (H) 04/14/2019   GLUCOSE 111 (H) 04/07/2014   GLUCOSE 93 07/28/2008   CO2 24 04/14/2019   CO2 27 04/07/2014   CO2 31 07/28/2008   BUN 18 04/14/2019   BUN 18 03/20/2017   BUN 10 04/07/2014   BUN 17 07/28/2008   CREATININE 1.28 (H) 04/14/2019   CREATININE 1.15 04/07/2014   CREATININE 1.3 (H) 07/28/2008   CALCIUM 9.4 04/14/2019   CALCIUM 8.2 (L) 04/07/2014   CALCIUM 9.5 07/28/2008   Radiology review: I personally reviewed the radiographic imaging as well as the radiologist report.  There is visible mesh present from prior abdominal hernia repair.  Bowel is adherent.  CLINICAL DATA:  Constipation with history of colectomy. Rule out bowel obstruction.  EXAM: CT ABDOMEN AND PELVIS WITH CONTRAST  TECHNIQUE: Multidetector CT imaging of the abdomen and pelvis was performed using the standard protocol  following bolus administration of intravenous contrast.  CONTRAST:  159mL OMNIPAQUE IOHEXOL 300 MG/ML  SOLN  COMPARISON:  August 09, 2015  FINDINGS: Lower chest: No acute abnormality.  Hepatobiliary: No focal liver abnormality is seen. No gallstones, gallbladder wall thickening, or biliary dilatation.  Pancreas: Single coarse calcification within the pancreatic body, nonspecific.  Spleen: Normal in size without focal abnormality.  Adrenals/Urinary Tract: Normal adrenal glands. Bilateral nonobstructive nephrolithiasis with a large 1.3 cm casting calculus within the lower pole of the left kidney and 7 mm calculus in the midpole region of the right kidney. Mild bilateral pelviectasis may be due to marked distention of the urinary bladder. Bilateral cystic renal lesions, too small to be actually characterize by CT.  Stomach/Bowel: Normal stomach and duodenum. Status post subtotal colectomy with intact ileorectal anastomosis. Abnormally dilated fluid-filled small bowel loops in the mid left abdomen measuring up to 4.2 cm with fecalization of contents, evidence of stasis. Abrupt caliber change in the left lower quadrant. The distal ileum leading to the anastomotic site is decompressed. Tethering of small bowel loops to the anterior abdominal wall.  Vascular/Lymphatic: Aortic atherosclerosis. No enlarged abdominal or pelvic lymph nodes.  Reproductive: The prostate gland is mildly enlarged.  Other: Prior anterior abdominal wall repair. No evidence of free gas or free fluid in the abdomen or pelvis.  Musculoskeletal: No acute osseous findings. L1-L5 posterior fusion. L5-S1 spondylosis. Total right hip arthroplasty. The hardware components are intact.  IMPRESSION: 1. Small-bowel obstruction with transition point in the left lower quadrant, possibly due to adhesions. 2. Status post subtotal colectomy with intact ileorectal anastomosis. 3. Bilateral nonobstructive  nephrolithiasis. 4. Mild bilateral pelviectasis may be due to marked distention of the urinary bladder.  Aortic Atherosclerosis (ICD10-I70.0).   Electronically Signed   By: Fidela Salisbury M.D.   On: 04/14/2019 14:13  Assessment:   Active Problems:   Partial small bowel obstruction ([place])   Plan:   This is a 78 year old man who has a fairly  extensive abdominal surgical history.  He presents with approximately 12 hours of abdominal distention accompanied by nausea without vomiting.  Imaging is suggestive of a bowel obstruction.  We will attempt to treat this conservatively with nasoenteric decompression, n.p.o., and IV fluids;  based upon his imaging and reported surgical history, laparotomy would be fraught with risk.  He understands however, that this may be the endpoint, should he fail to improve with conservative measures.

## 2019-04-14 NOTE — ED Notes (Signed)
ED TO INPATIENT HANDOFF REPORT  ED Nurse Name and Phone #: Lauretta Grill Name/Age/Gender [name] 78 y.o. male Room/Bed: ED07A/ED07A  Code Status   Code Status: Full Code  Home/SNF/Other Home Patient oriented to: self, place, time and situation Is this baseline? Yes   Triage Complete: Triage complete  Chief Complaint Weak extended abd  Triage Note Pt to ED via POV, pt states that he is having abdominal pain and abdominal distention. Pt reports that he had a colectomy 13 years ago, typically has 12 BMs a day but has not had one since yesterday. Pt reports nausea without vomiting. Pt is in NAD at this time.    Allergies Allergies  Allergen Reactions  . Morphine And Related Other (See Comments)    Severe hallucinations  . Other Other (See Comments)    Silk tape - Makes skin peel  . Tape Other (See Comments)    Silk Tape per patient "peels off my skin"    Level of Care/Admitting Diagnosis ED Disposition    ED Disposition Condition Mutual: [place] [100120]  Level of Care: Med-Surg [16]  Covid Evaluation: Asymptomatic Screening Protocol (No Symptoms)  Diagnosis: Partial small bowel obstruction [facility]) [[id number]]  Admitting Physician: Darreld Mclean  Attending Physician: Darreld Mclean  Estimated length of stay: 5 - 7 days  Certification:: I certify this patient will need inpatient services for at least 2 midnights  Bed request comments: 2C, if available  PT Class (Do Not Modify): Inpatient [101]  PT Acc Code (Do Not Modify): Private [1]       B Medical/Surgery History Past Medical History:  Diagnosis Date  . Arthritis   . Asthma   . Bowel obstruction ([place]) 6301,6010  . Depression   . Diverticulitis 2006  . Enlarged prostate   . Gallstones 07/04/2014  . History of gastrointestinal ulcer   . History of kidney stones   . HLD (hyperlipidemia)   . HTN (hypertension)   . Kidney stone     kidney stones  . Neuritis or radiculitis due to rupture of lumbar intervertebral disc 05/29/2015  . Thyroid disease    Past Surgical History:  Procedure Laterality Date  . COLECTOMY  2006   Abdominal colectomy, ileostomy, Hartman procedure for perforation of cecum, sigmoid diverticulitis  . HERNIA REPAIR  2008?   [place]  . ILEOSTOMY  2007  . JOINT REPLACEMENT  2014   Hip Replacement Hessie Knows  . KNEE SURGERY Right 2011  . POSTERIOR LUMBAR FUSION 4 LEVEL N/A 03/13/2019   Procedure: POSTERIOR LUMBAR FUSION 4 LEVEL L1-5;  Surgeon: Meade Maw, MD;  Location: [facility] ORS;  Service: Neurosurgery;  Laterality: N/A;     A IV Location/Drains/Wounds Patient Lines/Drains/Airways Status   Active Line/Drains/Airways    Name:   Placement date:   Placement time:   Site:   Days:   Peripheral IV 04/14/19 Left Forearm   04/14/19    1108    Forearm   less than 1   NG/OG Tube Nasogastric 14 Fr. Left nare Xray;Aucultation   04/14/19    1455    Left nare   less than 1   Incision (Closed) 03/13/19 Back Right   03/13/19    1229     32   Incision (Closed) 03/13/19 Back Other (Comment)   03/13/19    1229     32  Intake/Output Last 24 hours  Intake/Output Summary (Last 24 hours) at 04/14/2019 1502 Last data filed at 04/14/2019 1230 Gross per 24 hour  Intake 1000 ml  Output -  Net 1000 ml    Labs/Imaging Results for orders placed or performed during the hospital encounter of 04/14/19 (from the past 48 hour(s))  Lipase, blood     Status: None   Collection Time: 04/14/19 10:29 AM  Result Value Ref Range   Lipase 34 11 - 51 U/L    Comment: Performed at [facility], [address] [place], [place] [address]  Comprehensive metabolic panel     Status: Abnormal   Collection Time: 04/14/19 10:29 AM  Result Value Ref Range   Sodium 137 135 - 145 mmol/L   Potassium 4.4 3.5 - 5.1 mmol/L   Chloride 105 98 - 111 mmol/L   CO2 24 22 - 32 mmol/L    Glucose, Bld 113 (H) 70 - 99 mg/dL   BUN 18 8 - 23 mg/dL   Creatinine, Ser 1.28 (H) 0.61 - 1.24 mg/dL   Calcium 9.4 8.9 - 10.3 mg/dL   Total Protein 6.9 6.5 - 8.1 g/dL   Albumin 4.0 3.5 - 5.0 g/dL   AST 29 15 - 41 U/L   ALT 19 0 - 44 U/L   Alkaline Phosphatase 118 38 - 126 U/L   Total Bilirubin 0.6 0.3 - 1.2 mg/dL   GFR calc non Af Amer 54 (L) >60 mL/min   GFR calc Af Amer >60 >60 mL/min   Anion gap 8 5 - 15    Comment: Performed at [facility], [address] [place], [place] [address]  CBC     Status: Abnormal   Collection Time: 04/14/19 10:29 AM  Result Value Ref Range   WBC 3.6 (L) 4.0 - 10.5 K/uL   RBC 4.35 4.22 - 5.81 MIL/uL   Hemoglobin 12.0 (L) 13.0 - 17.0 g/dL   HCT 37.4 (L) 39.0 - 52.0 %   MCV 86.0 80.0 - 100.0 fL   MCH 27.6 26.0 - 34.0 pg   MCHC 32.1 30.0 - 36.0 g/dL   RDW 13.3 11.5 - 15.5 %   Platelets 188 150 - 400 K/uL   nRBC 0.0 0.0 - 0.2 %    Comment: Performed at [facility], [address] [place], [place] [address]  Urinalysis, Complete w Microscopic     Status: Abnormal   Collection Time: 04/14/19 10:29 AM  Result Value Ref Range   Color, Urine YELLOW (A) YELLOW   APPearance CLEAR (A) CLEAR   Specific Gravity, Urine 1.012 1.005 - 1.030   pH 6.0 5.0 - 8.0   Glucose, UA NEGATIVE NEGATIVE mg/dL   Hgb urine dipstick MODERATE (A) NEGATIVE   Bilirubin Urine NEGATIVE NEGATIVE   Ketones, ur NEGATIVE NEGATIVE mg/dL   Protein, ur NEGATIVE NEGATIVE mg/dL   Nitrite NEGATIVE NEGATIVE   Leukocytes,Ua NEGATIVE NEGATIVE   RBC / HPF 11-20 0 - 5 RBC/hpf   WBC, UA NONE SEEN 0 - 5 WBC/hpf   Bacteria, UA NONE SEEN NONE SEEN   Squamous Epithelial / LPF NONE SEEN 0 - 5   Mucus PRESENT     Comment: Performed at [facility], [address] [place], [place] [address]   Ct Abdomen Pelvis W Contrast  Result Date: 04/14/2019 CLINICAL DATA:  Constipation with history of colectomy. Rule out bowel obstruction. EXAM: CT ABDOMEN AND PELVIS WITH  CONTRAST TECHNIQUE: Multidetector CT imaging of the abdomen  and pelvis was performed using the standard protocol following bolus administration of intravenous contrast. CONTRAST:  147mL OMNIPAQUE IOHEXOL 300 MG/ML  SOLN COMPARISON:  August 09, 2015 FINDINGS: Lower chest: No acute abnormality. Hepatobiliary: No focal liver abnormality is seen. No gallstones, gallbladder wall thickening, or biliary dilatation. Pancreas: Single coarse calcification within the pancreatic body, nonspecific. Spleen: Normal in size without focal abnormality. Adrenals/Urinary Tract: Normal adrenal glands. Bilateral nonobstructive nephrolithiasis with a large 1.3 cm casting calculus within the lower pole of the left kidney and 7 mm calculus in the midpole region of the right kidney. Mild bilateral pelviectasis may be due to marked distention of the urinary bladder. Bilateral cystic renal lesions, too small to be actually characterize by CT. Stomach/Bowel: Normal stomach and duodenum. Status post subtotal colectomy with intact ileorectal anastomosis. Abnormally dilated fluid-filled small bowel loops in the mid left abdomen measuring up to 4.2 cm with fecalization of contents, evidence of stasis. Abrupt caliber change in the left lower quadrant. The distal ileum leading to the anastomotic site is decompressed. Tethering of small bowel loops to the anterior abdominal wall. Vascular/Lymphatic: Aortic atherosclerosis. No enlarged abdominal or pelvic lymph nodes. Reproductive: The prostate gland is mildly enlarged. Other: Prior anterior abdominal wall repair. No evidence of free gas or free fluid in the abdomen or pelvis. Musculoskeletal: No acute osseous findings. L1-L5 posterior fusion. L5-S1 spondylosis. Total right hip arthroplasty. The hardware components are intact. IMPRESSION: 1. Small-bowel obstruction with transition point in the left lower quadrant, possibly due to adhesions. 2. Status post subtotal colectomy with intact ileorectal  anastomosis. 3. Bilateral nonobstructive nephrolithiasis. 4. Mild bilateral pelviectasis may be due to marked distention of the urinary bladder. Aortic Atherosclerosis (ICD10-I70.0). Electronically Signed   By: Fidela Salisbury M.D.   On: 04/14/2019 14:13    Pending Labs Unresulted Labs (From admission, onward)    Start     Ordered   04/21/19 0500  Creatinine, serum  (enoxaparin (LOVENOX)    CrCl >/= 30 ml/min)  Weekly,   STAT    Comments: while on enoxaparin therapy    04/14/19 1441   04/15/19 4235  Basic metabolic panel  Tomorrow morning,   STAT     04/14/19 1441   04/15/19 0500  Phosphorus  Tomorrow morning,   STAT     04/14/19 1441   04/15/19 0500  CBC  Tomorrow morning,   STAT     04/14/19 1441   04/14/19 1436  CBC  (enoxaparin (LOVENOX)    CrCl >/= 30 ml/min)  Once,   STAT    Comments: Baseline for enoxaparin therapy IF NOT ALREADY DRAWN.  Notify MD if PLT < 100 K.    04/14/19 1441   04/14/19 1436  Creatinine, serum  (enoxaparin (LOVENOX)    CrCl >/= 30 ml/min)  Once,   STAT    Comments: Baseline for enoxaparin therapy IF NOT ALREADY DRAWN.    04/14/19 1441   04/14/19 1427  SARS CORONAVIRUS 2 Nasal Swab Aptima Multi Swab  (Asymptomatic/Tier 2 Patients Labs)  ONCE - STAT,   STAT    Question Answer Comment  Is this test for diagnosis or screening Screening   Symptomatic for COVID-19 as defined by CDC No   Hospitalized for COVID-19 No   Admitted to ICU for COVID-19 No   Previously tested for COVID-19 No   Resident in a congregate (group) care setting No   Employed in healthcare setting No      04/14/19 1426  Vitals/Pain Today's Vitals   04/14/19 1230 04/14/19 1231 04/14/19 1314 04/14/19 1330  BP: (!) 149/89   (!) 141/83  Pulse: 80   76  Resp: 18   18  Temp:      TempSrc:      SpO2: 98%   95%  Weight:      Height:      PainSc:  1  2      Isolation Precautions No active isolations  Medications Medications  sodium chloride flush (NS) 0.9 %  injection 3 mL (3 mLs Intravenous Not Given 04/14/19 1038)  finasteride (PROSCAR) tablet 5 mg (has no administration in time range)  tamsulosin (FLOMAX) capsule 0.4 mg (has no administration in time range)  enoxaparin (LOVENOX) injection 40 mg (has no administration in time range)  dextrose 5 % and 0.45 % NaCl with KCl 40 mEq/L infusion (has no administration in time range)  ketorolac (TORADOL) 15 MG/ML injection 15 mg (has no administration in time range)  acetaminophen (TYLENOL) tablet 650 mg (has no administration in time range)    Or  acetaminophen (TYLENOL) suppository 650 mg (has no administration in time range)  ondansetron (ZOFRAN-ODT) disintegrating tablet 4 mg (has no administration in time range)    Or  ondansetron (ZOFRAN) injection 4 mg (has no administration in time range)  levothyroxine (SYNTHROID, LEVOTHROID) injection 75 mcg (has no administration in time range)  pantoprazole (PROTONIX) injection 40 mg (has no administration in time range)  ondansetron (ZOFRAN) injection 4 mg (4 mg Intravenous Given 04/14/19 1116)  sodium chloride 0.9 % bolus 1,000 mL (0 mLs Intravenous Stopped 04/14/19 1230)  iohexol (OMNIPAQUE) 240 MG/ML injection 50 mL (50 mLs Oral Contrast Given 04/14/19 1120)  iohexol (OMNIPAQUE) 300 MG/ML solution 100 mL (100 mLs Intravenous Contrast Given 04/14/19 1249)    Mobility walks Low fall risk   Focused Assessments   R Recommendations: See Admitting Provider Note  Report given to:   Additional Notes: NGT in place

## 2019-04-14 NOTE — ED Notes (Signed)
Patient has completed 1 bottle of contrast. Reports that he cannot drink anymore because his stomach is becoming more distended.

## 2019-04-14 NOTE — ED Notes (Signed)
Pt given phone to call wife.

## 2019-04-14 NOTE — ED Notes (Signed)
CT notified that pt in unable to drink the second bottle of contrast.

## 2019-04-14 NOTE — ED Notes (Signed)
Pt given urine cup for sample. Pt unable to go at this time.   Green and purple top sent to lab

## 2019-04-14 NOTE — ED Provider Notes (Signed)
[facility] Emergency Department Provider Note  ____________________________________________   First MD Initiated Contact with Patient 04/14/19 1046     (approximate)  I have reviewed the triage vital signs and the nursing notes.  History  Chief Complaint Abdominal Pain    HPI Stephen Cervantes is a 78 y.o. male status post colectomy in 7893 due complications from perforated diverticulitis, who presents with concerns for small bowel obstruction.  Patient states he typically has 12 bowel movements a day, but has not had one since yesterday evening.  He does not feel he has passed flatus either.  He reports nausea without vomiting.  No fevers.  Was otherwise feeling well prior to today.         Past Medical Hx Past Medical History:  Diagnosis Date  . Arthritis   . Asthma   . Bowel obstruction ([place]) 8101,7510  . Depression   . Diverticulitis 2006  . Enlarged prostate   . Gallstones 07/04/2014  . History of gastrointestinal ulcer   . History of kidney stones   . HLD (hyperlipidemia)   . HTN (hypertension)   . Kidney stone    kidney stones  . Neuritis or radiculitis due to rupture of lumbar intervertebral disc 05/29/2015  . Thyroid disease     Problem List Patient Active Problem List   Diagnosis Date Noted  . S/P lumbar fusion 03/13/2019  . Acquired trigger finger 06/28/2018  . Epigastric pain 05/22/2017  . History of duodenal ulcer 05/22/2017  . S/P colectomy 05/22/2017  . Transient ischemic attack (TIA) 01/31/2017  . Kidney stones 08/26/2015  . Left ureteral stone 08/16/2015  . Hydronephrosis with urinary obstruction due to ureteral calculus 08/16/2015  . Microscopic hematuria 08/16/2015  . Neuritis or radiculitis due to rupture of lumbar intervertebral disc 05/29/2015  . Left flank pain 07/04/2014  . Gallstones 07/04/2014  . Benign prostatic hyperplasia with urinary obstruction 01/09/2013  . Benign localized hyperplasia of prostate  with urinary obstruction and other lower urinary tract symptoms (LUTS)(600.21) 01/09/2013  . Calculi, ureter 09/13/2012  . Calculus of kidney 09/13/2012  . Frank hematuria 09/13/2012  . 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 Renal colic 25/85/2778  . Essential hypertension 07/28/2008    Past Surgical Hx Past Surgical History:  Procedure Laterality Date  . COLECTOMY  2006   Abdominal colectomy, ileostomy, Hartman procedure for perforation of cecum, sigmoid diverticulitis  . HERNIA REPAIR  2008?   [place]  . ILEOSTOMY  2007  . JOINT REPLACEMENT  2014   Hip Replacement Hessie Knows  . KNEE SURGERY Right 2011  . POSTERIOR LUMBAR FUSION 4 LEVEL N/A 03/13/2019   Procedure: POSTERIOR LUMBAR FUSION 4 LEVEL L1-5;  Surgeon: Meade Maw, MD;  Location: [facility] ORS;  Service: Neurosurgery;  Laterality: N/A;    Medications Prior to Admission medications   Medication Sig Start Date End Date Taking? Authorizing Provider  acetaminophen (TYLENOL) 325 MG tablet Take 2 tablets (650 mg total) by mouth every 4 (four) hours as needed for mild pain ((score 1 to 3) or temp > 100.5). 03/16/19   Marin Olp, PA-C  atorvastatin (LIPITOR) 10 MG tablet Take 10 mg by mouth daily.     [provider]  celecoxib (CELEBREX) 200 MG capsule Take 1 capsule (200 mg total) by mouth daily. 03/16/19   Marin Olp, PA-C  finasteride (PROSCAR) 5 MG tablet Take 1 tablet (5 mg total) by mouth daily. 04/09/19   Zara Council  A, PA-C  hydrochlorothiazide (HYDRODIURIL) 25 MG tablet Take 25 mg by mouth daily.  06/26/14   [provider]  levothyroxine (SYNTHROID) 150 MCG tablet Take 150 mcg by mouth daily before breakfast.    [provider]  loperamide (IMODIUM) 2 MG capsule Take 4 mg by mouth 3 (three) times daily.     [provider]  loratadine (CLARITIN) 10 MG tablet Take 10 mg by mouth daily.    [provider]  montelukast  (SINGULAIR) 10 MG tablet Take 10 mg by mouth daily.  06/26/14   [provider]  pantoprazole (PROTONIX) 40 MG tablet Take 40 mg by mouth daily before breakfast.  08/09/18   [provider]  potassium chloride (K-DUR) 10 MEQ tablet Take 20 mEq by mouth 2 (two) times a day.  06/11/18   [provider]  potassium citrate (UROCIT-K) 10 MEQ (1080 MG) SR tablet  04/04/19   [provider]  sucralfate (CARAFATE) 1 g tablet Take 1 g by mouth 2 (two) times a day.     [provider]  sulfamethoxazole-trimethoprim (BACTRIM DS) 800-160 MG tablet Take 1 tablet by mouth every 12 (twelve) hours. 04/10/19   Zara Council A, PA-C  tamsulosin (FLOMAX) 0.4 MG CAPS capsule Take 1 capsule (0.4 mg total) by mouth daily. 03/28/19   Zara Council A, PA-C  tiZANidine (ZANAFLEX) 4 MG tablet Take 1 tablet (4 mg total) by mouth every 6 (six) hours as needed. 03/16/19   Marin Olp, PA-C  vitamin B-12 (CYANOCOBALAMIN) 500 MCG tablet Take 500 mcg by mouth daily.    [provider]    Allergies Morphine and related, Other, and Tape  Family Hx Family History  Problem Relation Age of Onset  . Cancer Father        cancer of lung, brain  and lymphoma  . Parkinson's disease Brother   . Mesothelioma Brother   . Kidney disease Neg Hx   . Prostate cancer Neg Hx   . Kidney cancer Neg Hx   . Bladder Cancer Neg Hx     Social Hx Social History   Tobacco Use  . Smoking status: Former Smoker    Types: Cigarettes    Quit date: 1998    Years since quitting: 22.6  . Smokeless tobacco: Never Used  Substance Use Topics  . Alcohol use: No    Alcohol/week: 0.0 standard drinks  . Drug use: No     Review of Systems  Constitutional: Negative for fever. Negative for chills. Eyes: Negative for visual changes. ENT: Negative for sore throat. Cardiovascular: Negative for chest pain. Respiratory: Negative for shortness of breath. Gastrointestinal: + abdominal pain,  nausea, decreased BM Genitourinary: Negative for dysuria. Musculoskeletal: Negative for leg swelling. Skin: Negative for rash. Neurological: Negative for for headaches.   Physical Exam  Vital Signs: ED Triage Vitals  Enc Vitals Group     BP 04/14/19 1025 139/73     Pulse Rate 04/14/19 1025 74     Resp 04/14/19 1025 16     Temp 04/14/19 1025 98.2 F (36.8 C)     Temp Source 04/14/19 1025 Oral     SpO2 04/14/19 1025 96 %     Weight 04/14/19 1026 180 lb (81.6 kg)     Height 04/14/19 1026 5\' 10"  (1.778 m)     Head Circumference --      Peak Flow --      Pain Score 04/14/19 1026 3     Pain Loc --  Pain Edu? --      Excl. in [place]? --     Constitutional: Alert and oriented.  Eyes: Conjunctivae clear. Sclera anicteric. Head: Normocephalic. Atraumatic. Nose: No congestion. No rhinorrhea. Mouth/Throat: Mucous membranes are slightly dry.  Neck: No stridor.   Cardiovascular: Normal rate, regular rhythm. No murmurs. Extremities well perfused. Respiratory: Normal respiratory effort.  Lungs CTAB. Gastrointestinal: Soft, mild diffuse tenderness, no focal localizing tenderness, no rebound or guarding.  Mildly distended. Musculoskeletal: No lower extremity edema. Neurologic:  Normal speech and language. No gross focal neurologic deficits are appreciated.  Skin: Skin is warm, dry and intact. No rash noted. Psychiatric: Mood and affect are appropriate for situation.  EKG  N/A   Radiology  CT abdomen/pelvis: IMPRESSION: 1. Small-bowel obstruction with transition point in the left lower quadrant, possibly due to adhesions. 2. Status post subtotal colectomy with intact ileorectal anastomosis. 3. Bilateral nonobstructive nephrolithiasis. 4. Mild bilateral pelviectasis may be due to marked distention of the urinary bladder.     Procedures  Procedure(s) performed (including critical care):  Procedures   Initial Impression / Assessment and Plan / ED Course  78 y.o. male  status post colectomy in 1423 due to complications from perforated diverticulitis who presents to the emergency department with concerns for small bowel obstruction.  Will obtain labs, CT imaging, fluids and symptomatic treatment.  CT scan consistent with small bowel obstruction, transition point in the left lower quadrant, possibly due to adhesions.  Will discuss with surgery.  Discussed with surgery, will place NG and admit.    Final Clinical Impression(s) / ED Diagnosis  Final diagnoses:  Nausea and vomiting in adult  Small bowel obstruction [facility])      Note:  This document was prepared using Dragon voice recognition software and may include unintentional dictation errors.   Lilia Pro., MD 04/14/19 [phone]

## 2019-04-15 ENCOUNTER — Telehealth: Payer: Self-pay | Admitting: Family Medicine

## 2019-04-15 DIAGNOSIS — K56609 Unspecified intestinal obstruction, unspecified as to partial versus complete obstruction: Secondary | ICD-10-CM

## 2019-04-15 LAB — BASIC METABOLIC PANEL
Anion gap: 7 (ref 5–15)
BUN: 14 mg/dL (ref 8–23)
CO2: 21 mmol/L — ABNORMAL LOW (ref 22–32)
Calcium: 8.5 mg/dL — ABNORMAL LOW (ref 8.9–10.3)
Chloride: 107 mmol/L (ref 98–111)
Creatinine, Ser: 1.22 mg/dL (ref 0.61–1.24)
GFR calc Af Amer: >60 mL/min (ref >60)
GFR calc non Af Amer: 57 mL/min — ABNORMAL LOW (ref >60)
Glucose, Bld: 117 mg/dL — ABNORMAL HIGH (ref 70–99)
Potassium: 3.9 mmol/L (ref 3.5–5.1)
Sodium: 135 mmol/L (ref 135–145)

## 2019-04-15 LAB — CBC
HCT: 33.3 % — ABNORMAL LOW (ref 39.0–52.0)
Hemoglobin: 10.7 g/dL — ABNORMAL LOW (ref 13.0–17.0)
MCH: 27.4 pg (ref 26.0–34.0)
MCHC: 32.1 g/dL (ref 30.0–36.0)
MCV: 85.4 fL (ref 80.0–100.0)
Platelets: 160 10*3/uL (ref 150–400)
RBC: 3.9 MIL/uL — ABNORMAL LOW (ref 4.22–5.81)
RDW: 13.2 % (ref 11.5–15.5)
WBC: 3.5 10*3/uL — ABNORMAL LOW (ref 4.0–10.5)
nRBC: 0 % (ref 0.0–0.2)

## 2019-04-15 LAB — CULTURE, URINE COMPREHENSIVE

## 2019-04-15 LAB — SARS CORONAVIRUS 2 (TAT 6-24 HRS): SARS Coronavirus 2: NEGATIVE

## 2019-04-15 LAB — PHOSPHORUS: Phosphorus: 3 mg/dL (ref 2.5–4.6)

## 2019-04-15 MED ORDER — PNEUMOCOCCAL VAC POLYVALENT 25 MCG/0.5ML IJ INJ
0.5000 mL | INJECTION | INTRAMUSCULAR | Status: AC
  Administered 2019-04-16: 0.5 mL via INTRAMUSCULAR
  Filled 2019-04-15: qty 0.5

## 2019-04-15 NOTE — Progress Notes (Addendum)
[facility] SURGICAL PROGRESS NOTE (cpt [phone])  Hospital Day(s): 1.   Post op day(s):  Marland Kitchen   Interval History: Patient seen and examined, no acute events or new complaints overnight. Patient reports that he still has abdominal pain and distension but this is improving. No fever, chills, nausea, emesis. He did endorse two BMs earlier this morning. NGT with 700cs out. He remains NPO. No other acute issues.    Review of Systems:  Constitutional: denies fever, chills  HEENT: denies cough or congestion  Respiratory: denies any shortness of breath  Cardiovascular: denies chest pain or palpitations  Gastrointestinal: + abdominal pain, + abdominal distension, denied N/V, or diarrhea/and bowel function as per interval history Genitourinary: denies burning with urination or urinary frequency   Vital signs in last 24 hours: [min-max] current  Temp:  [98.1 F (36.7 C)-98.5 F (36.9 C)] 98.1 F (36.7 C) (08/10 0442) Pulse Rate:  [68-85] 68 (08/10 0442) Resp:  [16-20] 20 (08/10 0442) BP: (127-172)/(73-103) 132/85 (08/10 0442) SpO2:  [94 %-99 %] 96 % (08/10 0442) Weight:  [81.6 kg] 81.6 kg (08/09 1026)     Height: 5\' 10"  (177.8 cm) Weight: 81.6 kg BMI (Calculated): 25.83   Intake/Output last 2 shifts:  08/09 0701 - 08/10 0700 In: 2176.9 [I.V.:1176.9; IV Piggyback:1000] Out: 1500 [Urine:1050; Emesis/NG output:450]   Physical Exam:  Constitutional: alert, cooperative and no distress  HENT: normocephalic without obvious abnormality, NGT in place  Eyes: PERRL, EOM's grossly intact and symmetric  Respiratory: breathing non-labored at rest  Cardiovascular: regular rate and sinus rhythm  Gastrointestinal: firm, improved tenderness diffusely, still distended but again improved. No rebound or guarding   Labs:  CBC Latest Ref Rng & Units 04/15/2019 04/14/2019 03/24/2019  WBC 4.0 - 10.5 K/uL 3.5(L) 3.6(L) 13.1(H)  Hemoglobin 13.0 - 17.0 g/dL 10.7(L) 12.0(L) 10.4(L)  Hematocrit 39.0 -  52.0 % 33.3(L) 37.4(L) 31.7(L)  Platelets 150 - 400 K/uL 160 188 273   CMP Latest Ref Rng & Units 04/15/2019 04/14/2019 03/24/2019  Glucose 70 - 99 mg/dL 117(H) 113(H) 126(H)  BUN 8 - 23 mg/dL 14 18 14   Creatinine 0.61 - 1.24 mg/dL 1.22 1.28(H) 1.04  Sodium 135 - 145 mmol/L 135 137 136  Potassium 3.5 - 5.1 mmol/L 3.9 4.4 4.0  Chloride 98 - 111 mmol/L 107 105 106  CO2 22 - 32 mmol/L 21(L) 24 25  Calcium 8.9 - 10.3 mg/dL 8.5(L) 9.4 8.9  Total Protein 6.5 - 8.1 g/dL - 6.9 6.3(L)  Total Bilirubin 0.3 - 1.2 mg/dL - 0.6 1.2  Alkaline Phos 38 - 126 U/L - 118 165(H)  AST 15 - 41 U/L - 29 35  ALT 0 - 44 U/L - 19 31     Imaging studies: No new pertinent imaging studies   Assessment/Plan: (ICD-10's: K60.60) 78 y.o. male with small bowel obstruction likely due to post-surgical adhesions following subtotal colectomy secondary to pan-diverticulitis   - Continue NGT decompression for today; monitor output  - NPO + IVF  - Will repeat KUB in the morning  - pain control prn (minimize narcotics); antiemetics prn  - monitor abdominal examination    - Mobilization encouraged   All of the above findings and recommendations were discussed with the patient, and the medical team, and all of patient's questions were answered to his expressed satisfaction.  -- Edison Simon, PA-C [facility] 04/15/2019, 7:40 AM [phone] M-F: 7am - 4pm   I saw and evaluated the patient.  I agree with the above  documentation, exam, and plan, which I have edited where appropriate. Fredirick Maudlin  12:37 PM

## 2019-04-15 NOTE — Telephone Encounter (Signed)
LMOM for patient to return call.

## 2019-04-15 NOTE — Telephone Encounter (Signed)
-----   Message from Nori Riis, PA-C sent at 04/15/2019 11:53 AM EDT ----- Please let Mr. Warren know that his urine culture was positive for infection and the Septra DS that he has taken should kill the bacteria.

## 2019-04-16 ENCOUNTER — Inpatient Hospital Stay: Payer: Medicare Other

## 2019-04-16 LAB — BASIC METABOLIC PANEL
Anion gap: 8 (ref 5–15)
BUN: 9 mg/dL (ref 8–23)
CO2: 20 mmol/L — ABNORMAL LOW (ref 22–32)
Calcium: 8.9 mg/dL (ref 8.9–10.3)
Chloride: 108 mmol/L (ref 98–111)
Creatinine, Ser: 1.01 mg/dL (ref 0.61–1.24)
GFR calc Af Amer: >60 mL/min (ref >60)
GFR calc non Af Amer: >60 mL/min (ref >60)
Glucose, Bld: 109 mg/dL — ABNORMAL HIGH (ref 70–99)
Potassium: 4 mmol/L (ref 3.5–5.1)
Sodium: 136 mmol/L (ref 135–145)

## 2019-04-16 LAB — CBC
HCT: 34.2 % — ABNORMAL LOW (ref 39.0–52.0)
Hemoglobin: 10.9 g/dL — ABNORMAL LOW (ref 13.0–17.0)
MCH: 27.5 pg (ref 26.0–34.0)
MCHC: 31.9 g/dL (ref 30.0–36.0)
MCV: 86.4 fL (ref 80.0–100.0)
Platelets: 163 10*3/uL (ref 150–400)
RBC: 3.96 MIL/uL — ABNORMAL LOW (ref 4.22–5.81)
RDW: 13 % (ref 11.5–15.5)
WBC: 3.5 10*3/uL — ABNORMAL LOW (ref 4.0–10.5)
nRBC: 0 % (ref 0.0–0.2)

## 2019-04-16 NOTE — Progress Notes (Signed)
[facility] SURGICAL PROGRESS NOTE (cpt [phone])  Hospital Day(s): 2.   Interval History: Patient seen and examined, no acute events or new complaints overnight. Patient reports that he is feeling better this morning. He reports improvement in his abdominal discomfort and distension compared to admission. No fever, chills, nausea, and emesis. NGT with around 700 ccs out. He did endorse 4 bowel movements in about the last 12 hours. No other acute issues.   Review of Systems:  Constitutional: denies fever, chills  HEENT: denies cough or congestion  Respiratory: denies any shortness of breath  Cardiovascular: denies chest pain or palpitations  Gastrointestinal: denies abdominal pain, N/V, or diarrhea/and bowel function as per interval history Genitourinary: denies burning with urination or urinary frequency   Vital signs in last 24 hours: [min-max] current  Temp:  [98.5 F (36.9 C)-98.9 F (37.2 C)] 98.6 F (37 C) (08/11 0524) Pulse Rate:  [67-73] 67 (08/11 0524) Resp:  [16-17] 16 (08/11 0524) BP: (134-138)/(87-90) 138/90 (08/11 0524) SpO2:  [95 %-98 %] 95 % (08/11 0524)     Height: 5\' 10"  (177.8 cm) Weight: 81.6 kg BMI (Calculated): 25.83   Intake/Output last 2 shifts:  08/10 0701 - 08/11 0700 In: 2272.9 [I.V.:2272.9] Out: 1075 [Urine:350; Emesis/NG output:725]   Physical Exam:  Constitutional: alert, cooperative and no distress  HENT: normocephalic without obvious abnormality, NGT in place  Respiratory: breathing non-labored at rest  Gastrointestinal: soft, non-tender, and improved distension, no rebound/guarding Musculoskeletal: no edema or wounds, motor and sensation grossly intact, NT    Labs:  CBC Latest Ref Rng & Units 04/16/2019 04/15/2019 04/14/2019  WBC 4.0 - 10.5 K/uL 3.5(L) 3.5(L) 3.6(L)  Hemoglobin 13.0 - 17.0 g/dL 10.9(L) 10.7(L) 12.0(L)  Hematocrit 39.0 - 52.0 % 34.2(L) 33.3(L) 37.4(L)  Platelets 150 - 400 K/uL 163 160 188   CMP Latest Ref Rng &  Units 04/16/2019 04/15/2019 04/14/2019  Glucose 70 - 99 mg/dL 109(H) 117(H) 113(H)  BUN 8 - 23 mg/dL 9 14 18   Creatinine 0.61 - 1.24 mg/dL 1.01 1.22 1.28(H)  Sodium 135 - 145 mmol/L 136 135 137  Potassium 3.5 - 5.1 mmol/L 4.0 3.9 4.4  Chloride 98 - 111 mmol/L 108 107 105  CO2 22 - 32 mmol/L 20(L) 21(L) 24  Calcium 8.9 - 10.3 mg/dL 8.9 8.5(L) 9.4  Total Protein 6.5 - 8.1 g/dL - - 6.9  Total Bilirubin 0.3 - 1.2 mg/dL - - 0.6  Alkaline Phos 38 - 126 U/L - - 118  AST 15 - 41 U/L - - 29  ALT 0 - 44 U/L - - 19    Imaging studies:   KUB (04/16/2019) personally reviewed which appears somewhat improved, and radiologist report pending    Assessment/Plan: (ICD-10's: K44.60) 78 y.o. 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   -- Edison Simon, PA-C [facility] 04/16/2019, 9:16 AM [phone] M-F: 7am - 4pm

## 2019-04-16 NOTE — TOC Progression Note (Signed)
Transition of Care [facility]) - Progression Note    Patient Details  Name: Stephen Cervantes MRN: [id number] Date of Birth: 1940/12/24  [facility] CM/SW Contact  Katrina Stack, RN Phone Number: 04/16/2019, 2:57 PM  Clinical Narrative:  Being treated for small bowel obstruction.  Nasogastric tube to be clamped today.  If residual < 150 cc, tube will be removed and started on clear liquids.  Discussed need to mobilize patient during progression.  At present, no discharge needs identifiedy.          Expected Discharge Plan and Services           Expected Discharge Date: 04/17/19                                     Social Determinants of Health (SDOH) Interventions    Readmission Risk Interventions No flowsheet data found.

## 2019-04-16 NOTE — Progress Notes (Signed)
Patient transferred to Xray by orderly.

## 2019-04-16 NOTE — Progress Notes (Signed)
Per PA okay for RN to remove NG tube if his residual was less than 150 after being clamp for 4 hours and add order for clear liquids diet.

## 2019-04-17 NOTE — Discharge Summary (Addendum)
[facility] SURGICAL DISCHARGE SUMMARY (cpt: [phone])  Patient ID: Stephen Cervantes MRN: [id number] DOB/AGE: 04-Dec-1940 78 y.o.  Admit date: 04/14/2019 Discharge date: 04/17/2019  Discharge Diagnoses Small Bowel Obstruction  Consultants None  Procedures None  HPI: Patient is a 78 y.o. male status post colectomy in 6644 due complications from perforated diverticulitis, who presents with concerns for small bowel obstruction. Patient states he typically has 12 bowel movements a day, but has not had one since yesterday evening. He does not feel he has passed flatus either. He reports nausea without vomiting. No fevers. Was otherwise feeling well prior to today.  Hospital Course: Patient was admitted to general surgery on 08/09 and NGT was placed. NGT remain on LIS suction for 2 hospital days without issues. On 08/11 NGT was clamped after patent endorsed return of bowel function and residuals after 4 hours were less than 150 ccs. NGT was removed and diet initiated. Advancement of patient's diet and ambulation were well-tolerated. The remainder of patient's hospital course was essentially unremarkable, and discharge planning was initiated accordingly with patient safely able to be discharged home with appropriate discharge instructions, pain control, and outpatient follow-up after all of his questions were answered to his expressed satisfaction.  Discharge Condition: Good   Physical Examination:  Constitutional: Well appearing male, NAD Pulmonary: Normal Effort, No respiratory distress Gastrointestinal: Soft, non-tender, non-distended, no rebound/guarding, dull to percussion Skin: warm and dry   Allergies as of 04/17/2019      Reactions   Morphine And Related Other (See Comments)   Severe hallucinations   Other Other (See Comments)   Silk tape - Makes skin peel   Tape Other (See Comments)   Silk Tape per patient "peels off my skin"      Medication List    TAKE  these medications   acetaminophen 325 MG tablet Commonly known as: TYLENOL Take 2 tablets (650 mg total) by mouth every 4 (four) hours as needed for mild pain ((score 1 to 3) or temp > 100.5).   atorvastatin 10 MG tablet Commonly known as: LIPITOR Take 10 mg by mouth daily.   celecoxib 200 MG capsule Commonly known as: CELEBREX Take 1 capsule (200 mg total) by mouth daily.   finasteride 5 MG tablet Commonly known as: Proscar Take 1 tablet (5 mg total) by mouth daily.   hydrochlorothiazide 25 MG tablet Commonly known as: HYDRODIURIL Take 25 mg by mouth daily.   levothyroxine 150 MCG tablet Commonly known as: SYNTHROID Take 150 mcg by mouth daily before breakfast.   loperamide 2 MG capsule Commonly known as: IMODIUM Take 4 mg by mouth 3 (three) times daily as needed for diarrhea or loose stools.   loratadine 10 MG tablet Commonly known as: CLARITIN Take 10 mg by mouth daily as needed for allergies.   montelukast 10 MG tablet Commonly known as: SINGULAIR Take 10 mg by mouth daily.   pantoprazole 40 MG tablet Commonly known as: PROTONIX Take 40 mg by mouth daily before breakfast.   potassium citrate 10 MEQ (1080 MG) SR tablet Commonly known as: UROCIT-K Take 20 mEq by mouth 2 (two) times daily.   sucralfate 1 g tablet Commonly known as: CARAFATE Take 1 g by mouth 2 (two) times a day.   sulfamethoxazole-trimethoprim 800-160 MG tablet Commonly known as: BACTRIM DS Take 1 tablet by mouth every 12 (twelve) hours.   tamsulosin 0.4 MG Caps capsule Commonly known as: FLOMAX Take 1 capsule (0.4 mg total) by mouth daily.  tiZANidine 4 MG tablet Commonly known as: ZANAFLEX Take 1 tablet (4 mg total) by mouth every 6 (six) hours as needed. What changed: reasons to take this   vitamin B-12 500 MCG tablet Commonly known as: CYANOCOBALAMIN Take 500 mcg by mouth daily.       Follow Up: None needed, he was provided our contact information    Time spent on  discharge management including discussion of hospital course, clinical condition, outpatient instructions, prescriptions, and follow up with the patient and members of the medical team: >30 minutes  -- Edison Simon , PA-C [facility]  04/17/2019, 1:52 PM [phone] M-F: 7am - 4pm    I agree with the above documentation, exam, and plan, which I have edited where appropriate. Fredirick Maudlin  3:05 PM

## 2019-04-17 NOTE — Progress Notes (Signed)
[name]  A and O x 4. VSS. Pt tolerating diet well. No complaints of pain or nausea. IV removed intact, no new prescriptions given. Pt voiced understanding of discharge instructions with no further questions. Pt discharged home.   Allergies as of 04/17/2019      Reactions   Morphine And Related Other (See Comments)   Severe hallucinations   Other Other (See Comments)   Silk tape - Makes skin peel   Tape Other (See Comments)   Silk Tape per patient "peels off my skin"      Medication List    TAKE these medications   acetaminophen 325 MG tablet Commonly known as: TYLENOL Take 2 tablets (650 mg total) by mouth every 4 (four) hours as needed for mild pain ((score 1 to 3) or temp > 100.5).   atorvastatin 10 MG tablet Commonly known as: LIPITOR Take 10 mg by mouth daily.   celecoxib 200 MG capsule Commonly known as: CELEBREX Take 1 capsule (200 mg total) by mouth daily.   finasteride 5 MG tablet Commonly known as: Proscar Take 1 tablet (5 mg total) by mouth daily.   hydrochlorothiazide 25 MG tablet Commonly known as: HYDRODIURIL Take 25 mg by mouth daily.   levothyroxine 150 MCG tablet Commonly known as: SYNTHROID Take 150 mcg by mouth daily before breakfast.   loperamide 2 MG capsule Commonly known as: IMODIUM Take 4 mg by mouth 3 (three) times daily as needed for diarrhea or loose stools.   loratadine 10 MG tablet Commonly known as: CLARITIN Take 10 mg by mouth daily as needed for allergies.   montelukast 10 MG tablet Commonly known as: SINGULAIR Take 10 mg by mouth daily.   pantoprazole 40 MG tablet Commonly known as: PROTONIX Take 40 mg by mouth daily before breakfast.   potassium citrate 10 MEQ (1080 MG) SR tablet Commonly known as: UROCIT-K Take 20 mEq by mouth 2 (two) times daily.   sucralfate 1 g tablet Commonly known as: CARAFATE Take 1 g by mouth 2 (two) times a day.   sulfamethoxazole-trimethoprim 800-160 MG tablet Commonly known as:  BACTRIM DS Take 1 tablet by mouth every 12 (twelve) hours.   tamsulosin 0.4 MG Caps capsule Commonly known as: FLOMAX Take 1 capsule (0.4 mg total) by mouth daily.   tiZANidine 4 MG tablet Commonly known as: ZANAFLEX Take 1 tablet (4 mg total) by mouth every 6 (six) hours as needed. What changed: reasons to take this   vitamin B-12 500 MCG tablet Commonly known as: CYANOCOBALAMIN Take 500 mcg by mouth daily.       Vitals:   04/17/19 0521 04/17/19 1229  BP: (!) 150/89 (!) 142/80  Pulse: 69 70  Resp: 20 16  Temp: 98.5 F (36.9 C) 98.2 F (36.8 C)  SpO2: 95% 97%    Francesco Sor

## 2019-04-17 NOTE — Discharge Instructions (Signed)
 [facility] [address] [place],  [place]  [address]  Phone: [phone]   Do not need to follow up with [place], this is to provide you with our contact information

## 2019-04-17 NOTE — Telephone Encounter (Signed)
Left patient a detailed message notifying patient of results per [facility]

## 2019-04-17 NOTE — Care Management Important Message (Signed)
Important Message  Patient Details  Name: Stephen Cervantes MRN: [id number] Date of Birth: [birth year]   Medicare Important Message Given:  Yes  Late entry but Important Message was given at 11:49 am.    Juliann Pulse A Toniette Devera 04/17/2019, 3:43 PM

## 2019-04-17 NOTE — Plan of Care (Signed)
  Problem: Education: Goal: Knowledge of General Education information will improve Description: Including pain rating scale, medication(s)/side effects and non-pharmacologic comfort measures 04/17/2019 1335 by Iver Nestle, Colin Mulders, RN Outcome: Adequate for Discharge 04/17/2019 0744 by Iver Nestle, Colin Mulders, RN Outcome: Progressing   Problem: Health Behavior/Discharge Planning: Goal: Ability to manage health-related needs will improve 04/17/2019 1335 by Francesco Sor, RN Outcome: Adequate for Discharge 04/17/2019 0744 by Iver Nestle, Colin Mulders, RN Outcome: Progressing   Problem: Clinical Measurements: Goal: Ability to maintain clinical measurements within normal limits will improve 04/17/2019 1335 by Francesco Sor, RN Outcome: Adequate for Discharge 04/17/2019 0744 by Iver Nestle, Colin Mulders, RN Outcome: Progressing Goal: Will remain free from infection 04/17/2019 1335 by Francesco Sor, RN Outcome: Adequate for Discharge 04/17/2019 0744 by Iver Nestle, Colin Mulders, RN Outcome: Progressing Goal: Diagnostic test results will improve 04/17/2019 1335 by Francesco Sor, RN Outcome: Adequate for Discharge 04/17/2019 0744 by Francesco Sor, RN Outcome: Progressing Goal: Respiratory complications will improve 04/17/2019 1335 by Francesco Sor, RN Outcome: Adequate for Discharge 04/17/2019 0744 by Francesco Sor, RN Outcome: Progressing Goal: Cardiovascular complication will be avoided 04/17/2019 1335 by Francesco Sor, RN Outcome: Adequate for Discharge 04/17/2019 0744 by Francesco Sor, RN Outcome: Progressing   Problem: Activity: Goal: Risk for activity intolerance will decrease 04/17/2019 1335 by Iver Nestle, Colin Mulders, RN Outcome: Adequate for Discharge 04/17/2019 0744 by Iver Nestle, Colin Mulders, RN Outcome: Progressing   Problem: Nutrition: Goal: Adequate nutrition  will be maintained 04/17/2019 1335 by Francesco Sor, RN Outcome: Adequate for Discharge 04/17/2019 0744 by Iver Nestle, Colin Mulders, RN Outcome: Progressing   Problem: Coping: Goal: Level of anxiety will decrease 04/17/2019 1335 by Francesco Sor, RN Outcome: Adequate for Discharge 04/17/2019 0744 by Iver Nestle, Colin Mulders, RN Outcome: Progressing   Problem: Elimination: Goal: Will not experience complications related to bowel motility 04/17/2019 1335 by Francesco Sor, RN Outcome: Adequate for Discharge 04/17/2019 0744 by Francesco Sor, RN Outcome: Progressing Goal: Will not experience complications related to urinary retention 04/17/2019 1335 by Francesco Sor, RN Outcome: Adequate for Discharge 04/17/2019 0744 by Iver Nestle, Colin Mulders, RN Outcome: Progressing   Problem: Pain Managment: Goal: General experience of comfort will improve 04/17/2019 1335 by Francesco Sor, RN Outcome: Adequate for Discharge 04/17/2019 0744 by Iver Nestle, Colin Mulders, RN Outcome: Progressing   Problem: Safety: Goal: Ability to remain free from injury will improve 04/17/2019 1335 by Francesco Sor, RN Outcome: Adequate for Discharge 04/17/2019 0744 by Francesco Sor, RN Outcome: Progressing   Problem: Skin Integrity: Goal: Risk for impaired skin integrity will decrease 04/17/2019 1335 by Francesco Sor, RN Outcome: Adequate for Discharge 04/17/2019 0744 by Iver Nestle, Colin Mulders, RN Outcome: Progressing

## 2019-04-23 NOTE — Progress Notes (Signed)
04/24/2019 9:52 AM   [name] December 03, 1940 [id number]  Referring provider: Jodi Marble, MD [place],  [place] [address]  Chief Complaint  Patient presents with   Urinary Frequency    HPI: Stephen Cervantes is a 78 year old male with a history of hematuria, nephrolithiasis, BPH with LU TS and urinary retention who presents today complaining of difficulty with urination.    He had been hospitalized for a small bowel obstruction last week.   Urinary retention Experienced after back surgery.  Presented to the ED on 07/12 and had Foley place for > 800cc of urine.  He presented to the office on 07/22 for a TOV and was instructed in CIC.  Failed 1 st TOV on 07/22 and had Foley placed for > 1000cc.  He is currently on Flomax.  He presents today for another attempt at a TOV.  He stated he attempted to self cath prior to seeking treatment in the emergency room on July 22.  He stated he tried 4 times to pass a catheter and when he started to see blood he immediately stopped.  Foley catheter was removed 04/09/2019.  He returned that afternoon for a bladder scan.  His PVR was 110 mL.  Today's PVR is 39 mL.    History of hematuria (high risk) Remote 20PY smoker.   Eval 2016 - CT Urogram - nephrolithiasis, cysto with very large prostate.  No reports of gross hematuria.   Nephrolithiasis Pre 2016 - medicla passage x several, URS x 1 08/2015 - medical passage left 96mm ureteral stone, LLP 1cm non-obstructing as well. No right sided stones. Composition Uric Acid. UpH 5.5--> KCit 20 BID  KUB noted increased in size of bilateral stones Cr 1.6 KUB on 03/22/2018 noted nephrolithiasis, grossly stable. If further investigation desired, consider CT urography. KUB 04/24/2019 revealed bilateral nonobstructing renal calculi He is not having an flank pain, gross hematuria or passage of fragments.    BPH WITH LUTS  (prostate and/or bladder) I PSS score: 7/1       PVR: 29 mL     Previous  IPSS score was 2/1. He has no complaints at this visit.  He denies any dysuria, hematuria or suprapubic pain.  He also denies any recent fevers, chills, nausea or vomiting.  He is taking tamsulosin 0.4 mg and finasteride 5 mg daily.   PMH: Past Medical History:  Diagnosis Date   Arthritis    Asthma    Bowel obstruction ([place]) 8469,6295   Depression    Diverticulitis 2006   Enlarged prostate    Gallstones 07/04/2014   History of gastrointestinal ulcer    History of kidney stones    HLD (hyperlipidemia)    HTN (hypertension)    Kidney stone    kidney stones   Neuritis or radiculitis due to rupture of lumbar intervertebral disc 05/29/2015   Thyroid disease     Surgical History: Past Surgical History:  Procedure Laterality Date   COLECTOMY  2006   Abdominal colectomy, ileostomy, Hartman procedure for perforation of cecum, sigmoid diverticulitis   HERNIA REPAIR  2008?   [place]   ILEOSTOMY  2007   JOINT REPLACEMENT  2014   Hip Replacement Hessie Knows   KNEE SURGERY Right 2011   POSTERIOR LUMBAR FUSION 4 LEVEL N/A 03/13/2019   Procedure: POSTERIOR LUMBAR FUSION 4 LEVEL L1-5;  Surgeon: Meade Maw, MD;  Location: [facility] ORS;  Service: Neurosurgery;  Laterality: N/A;  Home Medications:  Allergies as of 04/24/2019      Reactions   Morphine And Related Other (See Comments)   Severe hallucinations   Other Other (See Comments)   Silk tape - Makes skin peel   Tape Other (See Comments)   Silk Tape per patient "peels off my skin"      Medication List       Accurate as of April 24, 2019  9:52 AM. If you have any questions, ask your nurse or doctor.        acetaminophen 325 MG tablet Commonly known as: TYLENOL Take 2 tablets (650 mg total) by mouth every 4 (four) hours as needed for mild pain ((score 1 to 3) or temp > 100.5).   atorvastatin 10 MG tablet Commonly known as: LIPITOR Take 10 mg by mouth daily.   celecoxib 200  MG capsule Commonly known as: CELEBREX Take 1 capsule (200 mg total) by mouth daily.   finasteride 5 MG tablet Commonly known as: Proscar Take 1 tablet (5 mg total) by mouth daily.   hydrochlorothiazide 25 MG tablet Commonly known as: HYDRODIURIL Take 25 mg by mouth daily.   levothyroxine 150 MCG tablet Commonly known as: SYNTHROID Take 150 mcg by mouth daily before breakfast.   loperamide 2 MG capsule Commonly known as: IMODIUM Take 4 mg by mouth 3 (three) times daily as needed for diarrhea or loose stools.   loratadine 10 MG tablet Commonly known as: CLARITIN Take 10 mg by mouth daily as needed for allergies.   montelukast 10 MG tablet Commonly known as: SINGULAIR Take 10 mg by mouth daily.   pantoprazole 40 MG tablet Commonly known as: PROTONIX Take 40 mg by mouth daily before breakfast.   potassium citrate 10 MEQ (1080 MG) SR tablet Commonly known as: UROCIT-K Take 20 mEq by mouth 2 (two) times daily.   sucralfate 1 g tablet Commonly known as: CARAFATE Take 1 g by mouth 2 (two) times a day.   sulfamethoxazole-trimethoprim 800-160 MG tablet Commonly known as: BACTRIM DS Take 1 tablet by mouth every 12 (twelve) hours.   tamsulosin 0.4 MG Caps capsule Commonly known as: FLOMAX Take 1 capsule (0.4 mg total) by mouth daily.   tiZANidine 4 MG tablet Commonly known as: ZANAFLEX Take 1 tablet (4 mg total) by mouth every 6 (six) hours as needed. What changed: reasons to take this   vitamin B-12 500 MCG tablet Commonly known as: CYANOCOBALAMIN Take 500 mcg by mouth daily.       Allergies:  Allergies  Allergen Reactions   Morphine And Related Other (See Comments)    Severe hallucinations   Other Other (See Comments)    Silk tape - Makes skin peel   Tape Other (See Comments)    Silk Tape per patient "peels off my skin"    Family History: Family History  Problem Relation Age of Onset   Cancer Father        cancer of lung, brain  and lymphoma    Parkinson's disease Brother    Mesothelioma Brother    Kidney disease Neg Hx    Prostate cancer Neg Hx    Kidney cancer Neg Hx    Bladder Cancer Neg Hx     Social History:  reports that he quit smoking about 22 years ago. His smoking use included cigarettes. He has never used smokeless tobacco. He reports that he does not drink alcohol or use drugs.  ROS: UROLOGY Frequent Urination?: No Hard to postpone  urination?: No Burning/pain with urination?: No Get up at night to urinate?: No Leakage of urine?: No Urine stream starts and stops?: No Trouble starting stream?: No Do you have to strain to urinate?: No Blood in urine?: No Urinary tract infection?: No Sexually transmitted disease?: No Injury to kidneys or bladder?: No Painful intercourse?: No Weak stream?: No Erection problems?: No Penile pain?: No  Gastrointestinal Nausea?: No Vomiting?: No Indigestion/heartburn?: No Diarrhea?: No Constipation?: No  Constitutional Fever: No Night sweats?: No Weight loss?: No Fatigue?: No  Skin Skin rash/lesions?: No Itching?: No  Eyes Blurred vision?: No Double vision?: No  Ears/Nose/Throat Sore throat?: No Sinus problems?: No  Hematologic/Lymphatic Swollen glands?: No Easy bruising?: No  Cardiovascular Leg swelling?: Yes Chest pain?: No  Respiratory Cough?: No Shortness of breath?: No  Endocrine Excessive thirst?: No  Musculoskeletal Back pain?: Yes Joint pain?: No  Neurological Headaches?: No Dizziness?: No  Psychologic Depression?: No Anxiety?: No  Physical Exam: BP 125/81 (BP Location: Left Arm, Patient Position: Sitting, Cuff Size: Normal)    Pulse 87    Ht 5\' 10"  (1.778 m)    Wt 172 lb 11.2 oz (78.3 kg)    BMI 24.78 kg/m   Constitutional:  Well nourished. Alert and oriented, No acute distress. HEENT: [place] AT, moist mucus membranes.  Trachea midline, no masses. Cardiovascular: No clubbing, cyanosis, or edema. Respiratory: Normal  respiratory effort, no increased work of breathing. Skin: No rashes, bruises or suspicious lesions. Lymph: No inguinal adenopathy. Neurologic: Grossly intact, no focal deficits, moving all 4 extremities. Psychiatric: Normal mood and affect.   Pertinent Imaging CLINICAL DATA:  Flank pain  EXAM: ABDOMEN - 1 VIEW  COMPARISON:  04/16/2019  FINDINGS: Scattered large and small bowel gas is noted. Bilateral nonobstructing renal calculi are again seen. No ureteral stones are noted. Postsurgical changes in the lumbar spine are seen as well as in the anterior abdominal wall. No free air is noted. No acute bony abnormality is seen.  IMPRESSION: Bilateral nonobstructing renal calculi   Electronically Signed   By: Inez Catalina M.D.   On: 04/24/2019 09:06  I reviewed the films with the patient and appreciate the bilateral stones.    Assessment & Plan:   1. History of urinary retention: Resolved  2. History of hematuria Hematuria work up completed 2017 - no malignancy is found RTC in 6 months for UA   3. Bilateral nephrolithiasis Stable - does not want a procedure at this time to address the stones KUB in one year Advised to contact our office or seek treatment in the ED if becomes febrile or pain/ vomiting are difficult control in order to arrange for emergent/urgent intervention  4. BPH with LU TS I PSS score is 7/1, it is worse Continue conservative management, avoiding bladder irritants and timed voiding's Continue the tamsulosin 0.4 mg daily and finasteride 5 mg daily RTC in 6 months for I PSS and PVR    Return in about 6 months (around 10/25/2019) for UA, I PSS, PVR and exam .  These notes generated with voice recognition software. I apologize for typographical errors.  Stephen Council, PA-C  [facility] [address] [place], [place] [address] [phone]

## 2019-04-24 ENCOUNTER — Ambulatory Visit (INDEPENDENT_AMBULATORY_CARE_PROVIDER_SITE_OTHER): Payer: Medicare Other | Admitting: Urology

## 2019-04-24 ENCOUNTER — Ambulatory Visit
Admission: RE | Admit: 2019-04-24 | Discharge: 2019-04-24 | Disposition: A | Discharge status: Home / Self Care | Payer: Medicare Other | Source: Ambulatory Visit | Attending: Urology | Admitting: Urology

## 2019-04-24 ENCOUNTER — Encounter: Payer: Self-pay | Admitting: Urology

## 2019-04-24 ENCOUNTER — Other Ambulatory Visit: Payer: Self-pay

## 2019-04-24 VITALS — BP 125/81 | HR 87 | Ht 70.0 in | Wt 172.7 lb

## 2019-04-24 DIAGNOSIS — N138 Other obstructive and reflux uropathy: Secondary | ICD-10-CM

## 2019-04-24 DIAGNOSIS — N401 Enlarged prostate with lower urinary tract symptoms: Secondary | ICD-10-CM

## 2019-04-24 DIAGNOSIS — Z87448 Personal history of other diseases of urinary system: Secondary | ICD-10-CM

## 2019-04-24 DIAGNOSIS — Z87898 Personal history of other specified conditions: Secondary | ICD-10-CM

## 2019-04-24 DIAGNOSIS — N200 Calculus of kidney: Secondary | ICD-10-CM

## 2019-04-24 LAB — BLADDER SCAN AMB NON-IMAGING: Scan Result: 39

## 2019-04-29 ENCOUNTER — Other Ambulatory Visit: Payer: Self-pay | Admitting: Urology

## 2019-04-29 DIAGNOSIS — N138 Other obstructive and reflux uropathy: Secondary | ICD-10-CM

## 2019-04-29 DIAGNOSIS — Z87898 Personal history of other specified conditions: Secondary | ICD-10-CM

## 2019-05-20 ENCOUNTER — Other Ambulatory Visit: Payer: Self-pay

## 2019-05-20 ENCOUNTER — Inpatient Hospital Stay
Admission: EM | Admit: 2019-05-20 | Discharge: 2019-05-22 | DRG: 871 | Disposition: A | Discharge status: Home / Self Care | Payer: Medicare Other | Attending: Internal Medicine | Admitting: Internal Medicine

## 2019-05-20 ENCOUNTER — Encounter: Payer: Self-pay | Admitting: Emergency Medicine

## 2019-05-20 ENCOUNTER — Emergency Department: Payer: Medicare Other

## 2019-05-20 DIAGNOSIS — D696 Thrombocytopenia, unspecified: Secondary | ICD-10-CM | POA: Diagnosis present

## 2019-05-20 DIAGNOSIS — Z981 Arthrodesis status: Secondary | ICD-10-CM | POA: Diagnosis not present

## 2019-05-20 DIAGNOSIS — D649 Anemia, unspecified: Secondary | ICD-10-CM | POA: Diagnosis present

## 2019-05-20 DIAGNOSIS — E039 Hypothyroidism, unspecified: Secondary | ICD-10-CM | POA: Diagnosis present

## 2019-05-20 DIAGNOSIS — A419 Sepsis, unspecified organism: Principal | ICD-10-CM | POA: Diagnosis present

## 2019-05-20 DIAGNOSIS — Z20828 Contact with and (suspected) exposure to other viral communicable diseases: Secondary | ICD-10-CM | POA: Diagnosis present

## 2019-05-20 DIAGNOSIS — K529 Noninfective gastroenteritis and colitis, unspecified: Secondary | ICD-10-CM | POA: Diagnosis present

## 2019-05-20 DIAGNOSIS — J45909 Unspecified asthma, uncomplicated: Secondary | ICD-10-CM | POA: Diagnosis present

## 2019-05-20 DIAGNOSIS — E785 Hyperlipidemia, unspecified: Secondary | ICD-10-CM | POA: Diagnosis present

## 2019-05-20 DIAGNOSIS — J189 Pneumonia, unspecified organism: Secondary | ICD-10-CM | POA: Diagnosis present

## 2019-05-20 DIAGNOSIS — I10 Essential (primary) hypertension: Secondary | ICD-10-CM | POA: Diagnosis present

## 2019-05-20 DIAGNOSIS — Z801 Family history of malignant neoplasm of trachea, bronchus and lung: Secondary | ICD-10-CM

## 2019-05-20 DIAGNOSIS — Z8711 Personal history of peptic ulcer disease: Secondary | ICD-10-CM | POA: Diagnosis not present

## 2019-05-20 DIAGNOSIS — Z8673 Personal history of transient ischemic attack (TIA), and cerebral infarction without residual deficits: Secondary | ICD-10-CM | POA: Diagnosis not present

## 2019-05-20 DIAGNOSIS — N390 Urinary tract infection, site not specified: Secondary | ICD-10-CM | POA: Diagnosis present

## 2019-05-20 DIAGNOSIS — N179 Acute kidney failure, unspecified: Secondary | ICD-10-CM | POA: Diagnosis present

## 2019-05-20 DIAGNOSIS — Z91048 Other nonmedicinal substance allergy status: Secondary | ICD-10-CM

## 2019-05-20 DIAGNOSIS — B952 Enterococcus as the cause of diseases classified elsewhere: Secondary | ICD-10-CM | POA: Diagnosis present

## 2019-05-20 DIAGNOSIS — E876 Hypokalemia: Secondary | ICD-10-CM | POA: Diagnosis present

## 2019-05-20 DIAGNOSIS — Z87442 Personal history of urinary calculi: Secondary | ICD-10-CM

## 2019-05-20 DIAGNOSIS — M1990 Unspecified osteoarthritis, unspecified site: Secondary | ICD-10-CM | POA: Diagnosis present

## 2019-05-20 DIAGNOSIS — Z9049 Acquired absence of other specified parts of digestive tract: Secondary | ICD-10-CM

## 2019-05-20 DIAGNOSIS — Z79899 Other long term (current) drug therapy: Secondary | ICD-10-CM | POA: Diagnosis not present

## 2019-05-20 DIAGNOSIS — K219 Gastro-esophageal reflux disease without esophagitis: Secondary | ICD-10-CM | POA: Diagnosis present

## 2019-05-20 DIAGNOSIS — D72819 Decreased white blood cell count, unspecified: Secondary | ICD-10-CM | POA: Diagnosis present

## 2019-05-20 DIAGNOSIS — Z808 Family history of malignant neoplasm of other organs or systems: Secondary | ICD-10-CM

## 2019-05-20 DIAGNOSIS — Z96649 Presence of unspecified artificial hip joint: Secondary | ICD-10-CM | POA: Diagnosis present

## 2019-05-20 DIAGNOSIS — Z807 Family history of other malignant neoplasms of lymphoid, hematopoietic and related tissues: Secondary | ICD-10-CM

## 2019-05-20 DIAGNOSIS — Z87891 Personal history of nicotine dependence: Secondary | ICD-10-CM

## 2019-05-20 DIAGNOSIS — Z79890 Hormone replacement therapy (postmenopausal): Secondary | ICD-10-CM | POA: Diagnosis not present

## 2019-05-20 DIAGNOSIS — N400 Enlarged prostate without lower urinary tract symptoms: Secondary | ICD-10-CM | POA: Diagnosis present

## 2019-05-20 DIAGNOSIS — Z885 Allergy status to narcotic agent status: Secondary | ICD-10-CM

## 2019-05-20 DIAGNOSIS — Y95 Nosocomial condition: Secondary | ICD-10-CM | POA: Diagnosis present

## 2019-05-20 LAB — COMPREHENSIVE METABOLIC PANEL
ALT: 32 U/L (ref 0–44)
AST: 39 U/L (ref 15–41)
Albumin: 3.6 g/dL (ref 3.5–5.0)
Alkaline Phosphatase: 150 U/L — ABNORMAL HIGH (ref 38–126)
Anion gap: 7 (ref 5–15)
BUN: 16 mg/dL (ref 8–23)
CO2: 23 mmol/L (ref 22–32)
Calcium: 8.8 mg/dL — ABNORMAL LOW (ref 8.9–10.3)
Chloride: 106 mmol/L (ref 98–111)
Creatinine, Ser: 0.97 mg/dL (ref 0.61–1.24)
GFR calc Af Amer: >60 mL/min (ref >60)
GFR calc non Af Amer: >60 mL/min (ref >60)
Glucose, Bld: 140 mg/dL — ABNORMAL HIGH (ref 70–99)
Potassium: 3.3 mmol/L — ABNORMAL LOW (ref 3.5–5.1)
Sodium: 136 mmol/L (ref 135–145)
Total Bilirubin: 1.3 mg/dL — ABNORMAL HIGH (ref 0.3–1.2)
Total Protein: 6.6 g/dL (ref 6.5–8.1)

## 2019-05-20 LAB — CBC WITH DIFFERENTIAL/PLATELET
Abs Immature Granulocytes: 0.01 10*3/uL (ref 0.00–0.07)
Basophils Absolute: 0 10*3/uL (ref 0.0–0.1)
Basophils Relative: 0 %
Eosinophils Absolute: 0 10*3/uL (ref 0.0–0.5)
Eosinophils Relative: 1 %
HCT: 35 % — ABNORMAL LOW (ref 39.0–52.0)
Hemoglobin: 11.3 g/dL — ABNORMAL LOW (ref 13.0–17.0)
Immature Granulocytes: 0 %
Lymphocytes Relative: 7 %
Lymphs Abs: 0.3 10*3/uL — ABNORMAL LOW (ref 0.7–4.0)
MCH: 24.9 pg — ABNORMAL LOW (ref 26.0–34.0)
MCHC: 32.3 g/dL (ref 30.0–36.0)
MCV: 77.3 fL — ABNORMAL LOW (ref 80.0–100.0)
Monocytes Absolute: 0.2 10*3/uL (ref 0.1–1.0)
Monocytes Relative: 7 %
Neutro Abs: 2.9 10*3/uL (ref 1.7–7.7)
Neutrophils Relative %: 85 %
Platelets: 131 10*3/uL — ABNORMAL LOW (ref 150–400)
RBC: 4.53 MIL/uL (ref 4.22–5.81)
RDW: 13.8 % (ref 11.5–15.5)
WBC: 3.4 10*3/uL — ABNORMAL LOW (ref 4.0–10.5)
nRBC: 0 % (ref 0.0–0.2)

## 2019-05-20 LAB — GLUCOSE, CAPILLARY
Glucose-Capillary: 64 mg/dL — ABNORMAL LOW (ref 70–99)
Glucose-Capillary: 82 mg/dL (ref 70–99)

## 2019-05-20 LAB — URINALYSIS, COMPLETE (UACMP) WITH MICROSCOPIC
Bilirubin Urine: NEGATIVE
Glucose, UA: NEGATIVE mg/dL
Ketones, ur: NEGATIVE mg/dL
Leukocytes,Ua: NEGATIVE
Nitrite: NEGATIVE
Protein, ur: NEGATIVE mg/dL
Specific Gravity, Urine: 1.013 (ref 1.005–1.030)
pH: 6 (ref 5.0–8.0)

## 2019-05-20 LAB — INFLUENZA PANEL BY PCR (TYPE A & B)
Influenza A By PCR: NEGATIVE
Influenza B By PCR: NEGATIVE

## 2019-05-20 LAB — STREP PNEUMONIAE URINARY ANTIGEN: Strep Pneumo Urinary Antigen: NEGATIVE

## 2019-05-20 LAB — LACTIC ACID, PLASMA: Lactic Acid, Venous: 1.7 mmol/L (ref 0.5–1.9)

## 2019-05-20 LAB — SARS CORONAVIRUS 2 BY RT PCR (HOSPITAL ORDER, PERFORMED IN ~~LOC~~ HOSPITAL LAB): SARS Coronavirus 2: NEGATIVE

## 2019-05-20 MED ORDER — FINASTERIDE 5 MG PO TABS
5.0000 mg | ORAL_TABLET | Freq: Every day | ORAL | Status: DC
  Administered 2019-05-20 – 2019-05-22 (×3): 5 mg via ORAL
  Filled 2019-05-20 (×3): qty 1

## 2019-05-20 MED ORDER — SODIUM CHLORIDE 0.9 % IV SOLN
1.0000 g | Freq: Once | INTRAVENOUS | Status: AC
  Administered 2019-05-20: 06:00:00 1 g via INTRAVENOUS
  Filled 2019-05-20: qty 1

## 2019-05-20 MED ORDER — ACETAMINOPHEN 325 MG PO TABS
650.0000 mg | ORAL_TABLET | Freq: Four times a day (QID) | ORAL | Status: DC | PRN
  Administered 2019-05-20 – 2019-05-22 (×4): 650 mg via ORAL
  Filled 2019-05-20 (×4): qty 2

## 2019-05-20 MED ORDER — MAGNESIUM HYDROXIDE 400 MG/5ML PO SUSP
30.0000 mL | Freq: Every day | ORAL | Status: DC | PRN
  Filled 2019-05-20: qty 30

## 2019-05-20 MED ORDER — CELECOXIB 200 MG PO CAPS
200.0000 mg | ORAL_CAPSULE | Freq: Every day | ORAL | Status: DC
  Administered 2019-05-20 – 2019-05-22 (×3): 200 mg via ORAL
  Filled 2019-05-20 (×3): qty 1

## 2019-05-20 MED ORDER — PANTOPRAZOLE SODIUM 40 MG PO TBEC
40.0000 mg | DELAYED_RELEASE_TABLET | Freq: Every day | ORAL | Status: DC
  Administered 2019-05-20 – 2019-05-22 (×3): 40 mg via ORAL
  Filled 2019-05-20 (×3): qty 1

## 2019-05-20 MED ORDER — LEVOTHYROXINE SODIUM 50 MCG PO TABS
150.0000 ug | ORAL_TABLET | Freq: Every day | ORAL | Status: DC
  Administered 2019-05-21 – 2019-05-22 (×2): 150 ug via ORAL
  Filled 2019-05-20 (×2): qty 1

## 2019-05-20 MED ORDER — POTASSIUM CITRATE ER 10 MEQ (1080 MG) PO TBCR
20.0000 meq | EXTENDED_RELEASE_TABLET | Freq: Two times a day (BID) | ORAL | Status: DC
  Administered 2019-05-20 – 2019-05-22 (×5): 20 meq via ORAL
  Filled 2019-05-20 (×6): qty 2

## 2019-05-20 MED ORDER — SODIUM CHLORIDE 0.9 % IV SOLN
INTRAVENOUS | Status: DC
  Administered 2019-05-20 – 2019-05-21 (×4): via INTRAVENOUS

## 2019-05-20 MED ORDER — ACETAMINOPHEN 325 MG PO TABS: 650.0000 mg | ORAL_TABLET | ORAL | Status: DC | PRN

## 2019-05-20 MED ORDER — HYDROCHLOROTHIAZIDE 25 MG PO TABS
25.0000 mg | ORAL_TABLET | Freq: Every day | ORAL | Status: DC
  Administered 2019-05-20 – 2019-05-22 (×3): 25 mg via ORAL
  Filled 2019-05-20 (×3): qty 1

## 2019-05-20 MED ORDER — ENOXAPARIN SODIUM 40 MG/0.4ML ~~LOC~~ SOLN
40.0000 mg | SUBCUTANEOUS | Status: DC
  Administered 2019-05-20 – 2019-05-21 (×2): 40 mg via SUBCUTANEOUS
  Filled 2019-05-20 (×2): qty 0.4

## 2019-05-20 MED ORDER — MONTELUKAST SODIUM 10 MG PO TABS
10.0000 mg | ORAL_TABLET | Freq: Every day | ORAL | Status: DC
  Administered 2019-05-20 – 2019-05-22 (×3): 10 mg via ORAL
  Filled 2019-05-20 (×3): qty 1

## 2019-05-20 MED ORDER — ONDANSETRON HCL 4 MG/2ML IJ SOLN: 4.0000 mg | Freq: Four times a day (QID) | INTRAMUSCULAR | Status: DC | PRN

## 2019-05-20 MED ORDER — ACETAMINOPHEN 650 MG RE SUPP: 650.0000 mg | Freq: Four times a day (QID) | RECTAL | Status: DC | PRN

## 2019-05-20 MED ORDER — SODIUM CHLORIDE 0.9 % IV SOLN
2.0000 g | INTRAVENOUS | Status: DC
  Administered 2019-05-20 – 2019-05-21 (×2): 2 g via INTRAVENOUS
  Filled 2019-05-20 (×2): qty 2
  Filled 2019-05-20: qty 20

## 2019-05-20 MED ORDER — SODIUM CHLORIDE 0.9 % IV BOLUS
1000.0000 mL | Freq: Once | INTRAVENOUS | Status: AC
  Administered 2019-05-20: 05:00:00 1000 mL via INTRAVENOUS

## 2019-05-20 MED ORDER — GUAIFENESIN ER 600 MG PO TB12
600.0000 mg | ORAL_TABLET | Freq: Two times a day (BID) | ORAL | Status: DC
  Administered 2019-05-20 – 2019-05-22 (×5): 600 mg via ORAL
  Filled 2019-05-20 (×6): qty 1

## 2019-05-20 MED ORDER — IPRATROPIUM-ALBUTEROL 0.5-2.5 (3) MG/3ML IN SOLN
3.0000 mL | Freq: Four times a day (QID) | RESPIRATORY_TRACT | Status: DC
  Administered 2019-05-20 – 2019-05-22 (×8): 3 mL via RESPIRATORY_TRACT
  Filled 2019-05-20 (×9): qty 3

## 2019-05-20 MED ORDER — SUCRALFATE 1 G PO TABS
1.0000 g | ORAL_TABLET | Freq: Three times a day (TID) | ORAL | Status: DC
  Administered 2019-05-20 – 2019-05-22 (×10): 1 g via ORAL
  Filled 2019-05-20 (×7): qty 1

## 2019-05-20 MED ORDER — ATORVASTATIN CALCIUM 20 MG PO TABS
10.0000 mg | ORAL_TABLET | Freq: Every day | ORAL | Status: DC
  Administered 2019-05-20 – 2019-05-22 (×3): 10 mg via ORAL
  Filled 2019-05-20 (×3): qty 1

## 2019-05-20 MED ORDER — LORATADINE 10 MG PO TABS: 10.0000 mg | ORAL_TABLET | Freq: Every day | ORAL | Status: DC | PRN

## 2019-05-20 MED ORDER — ONDANSETRON HCL 4 MG PO TABS: 4.0000 mg | ORAL_TABLET | Freq: Four times a day (QID) | ORAL | Status: DC | PRN

## 2019-05-20 MED ORDER — CYANOCOBALAMIN 500 MCG PO TABS
500.0000 ug | ORAL_TABLET | Freq: Every day | ORAL | Status: DC
  Administered 2019-05-20 – 2019-05-22 (×3): 500 ug via ORAL
  Filled 2019-05-20 (×3): qty 1

## 2019-05-20 MED ORDER — SODIUM CHLORIDE 0.9 % IV SOLN
500.0000 mg | INTRAVENOUS | Status: DC
  Administered 2019-05-20 – 2019-05-21 (×2): 500 mg via INTRAVENOUS
  Filled 2019-05-20 (×2): qty 500

## 2019-05-20 MED ORDER — TIZANIDINE HCL 4 MG PO TABS
4.0000 mg | ORAL_TABLET | Freq: Four times a day (QID) | ORAL | Status: DC | PRN
  Filled 2019-05-20: qty 1

## 2019-05-20 MED ORDER — TAMSULOSIN HCL 0.4 MG PO CAPS
0.4000 mg | ORAL_CAPSULE | Freq: Every day | ORAL | Status: DC
  Administered 2019-05-20 – 2019-05-22 (×3): 0.4 mg via ORAL
  Filled 2019-05-20 (×3): qty 1

## 2019-05-20 MED ORDER — ACETAMINOPHEN 500 MG PO TABS
1000.0000 mg | ORAL_TABLET | Freq: Once | ORAL | Status: AC
  Administered 2019-05-20: 05:00:00 1000 mg via ORAL
  Filled 2019-05-20: qty 2

## 2019-05-20 MED ORDER — VANCOMYCIN HCL IN DEXTROSE 1-5 GM/200ML-% IV SOLN
1000.0000 mg | Freq: Once | INTRAVENOUS | Status: AC
  Administered 2019-05-20: 07:00:00 1000 mg via INTRAVENOUS
  Filled 2019-05-20: qty 200

## 2019-05-20 NOTE — ED Provider Notes (Signed)
[facility] Emergency Department Provider Note  ____________________________________________  Time seen: Approximately 4:07 AM  I have reviewed the triage vital signs and the nursing notes.   HISTORY  Chief Complaint Fever   HPI Stephen Cervantes is a 78 y.o. male history of diverticulitis status post colectomy, kidney stones, peptic ulcer disease, hypertension, hyperlipidemia who presents for evaluation of fever.  Patient reports 36 hours of chills and body aches.  Did not have a fever at home.  Had one episode of nonbloody nonbilious emesis yesterday morning.  Denies cough, sore throat, chest pain, shortness of breath, abdominal pain, dysuria, hematuria, flank pain.  He does have diarrhea however this is chronic because of his colectomy.  Denies any known exposures to COVID.  Lives at home with his girlfriend.   Per EMS patient was hypoxic and transported on 2 L oxygen.  Upon arrival to the emergency room patient sats are between 93 and 97% on room air.  Past Medical History:  Diagnosis Date  . Arthritis   . Asthma   . Bowel obstruction ([place]) [id number]  . Depression   . Diverticulitis 2006  . Enlarged prostate   . Gallstones 07/04/2014  . History of gastrointestinal ulcer   . History of kidney stones   . HLD (hyperlipidemia)   . HTN (hypertension)   . Kidney stone    kidney stones  . Neuritis or radiculitis due to rupture of lumbar intervertebral disc 05/29/2015  . Thyroid disease     Patient Active Problem List   Diagnosis Date Noted  . Partial small bowel obstruction ([place]) 04/14/2019  . S/P lumbar fusion 03/13/2019  . Acquired trigger finger 06/28/2018  . Epigastric pain 05/22/2017  . History of duodenal ulcer 05/22/2017  . S/P colectomy 05/22/2017  . Transient ischemic attack (TIA) 01/31/2017  . Kidney stones 08/26/2015  . Left ureteral stone 08/16/2015  . Hydronephrosis with urinary obstruction due to ureteral calculus 08/16/2015  .  Microscopic hematuria 08/16/2015  . Neuritis or radiculitis due to rupture of lumbar intervertebral disc 05/29/2015  . Left flank pain 07/04/2014  . Gallstones 07/04/2014  . Benign prostatic hyperplasia with urinary obstruction 01/09/2013  . Benign localized hyperplasia of prostate with urinary obstruction and other lower urinary tract symptoms (LUTS)(600.21) 01/09/2013  . Calculi, ureter 09/13/2012  . Calculus of kidney 09/13/2012  . Frank hematuria 09/13/2012  . 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 Renal colic [id number]  . Essential hypertension 07/28/2008    Past Surgical History:  Procedure Laterality Date  . COLECTOMY  2006   Abdominal colectomy, ileostomy, Hartman procedure for perforation of cecum, sigmoid diverticulitis  . HERNIA REPAIR  2008?   [place]  . ILEOSTOMY  2007  . JOINT REPLACEMENT  2014   Hip Replacement Hessie Knows  . KNEE SURGERY Right 2011  . POSTERIOR LUMBAR FUSION 4 LEVEL N/A 03/13/2019   Procedure: POSTERIOR LUMBAR FUSION 4 LEVEL L1-5;  Surgeon: Meade Maw, MD;  Location: [facility] ORS;  Service: Neurosurgery;  Laterality: N/A;    Prior to Admission medications   Medication Sig Start Date End Date Taking? Authorizing Provider  acetaminophen (TYLENOL) 325 MG tablet Take 2 tablets (650 mg total) by mouth every 4 (four) hours as needed for mild pain ((score 1 to 3) or temp > 100.5). 03/16/19   Marin Olp, PA-C  atorvastatin (LIPITOR) 10 MG tablet Take 10 mg by mouth daily.     [provider]  celecoxib (CELEBREX)  200 MG capsule Take 1 capsule (200 mg total) by mouth daily. 03/16/19   Marin Olp, PA-C  finasteride (PROSCAR) 5 MG tablet Take 1 tablet (5 mg total) by mouth daily. 04/09/19   Zara Council A, PA-C  hydrochlorothiazide (HYDRODIURIL) 25 MG tablet Take 25 mg by mouth daily.  06/26/14   [provider]  levothyroxine (SYNTHROID) 150 MCG tablet Take 150 mcg by mouth daily  before breakfast.    [provider]  loperamide (IMODIUM) 2 MG capsule Take 4 mg by mouth 3 (three) times daily as needed for diarrhea or loose stools.     [provider]  loratadine (CLARITIN) 10 MG tablet Take 10 mg by mouth daily as needed for allergies.     [provider]  montelukast (SINGULAIR) 10 MG tablet Take 10 mg by mouth daily.  06/26/14   [provider]  pantoprazole (PROTONIX) 40 MG tablet Take 40 mg by mouth daily before breakfast.  08/09/18   [provider]  potassium citrate (UROCIT-K) 10 MEQ (1080 MG) SR tablet Take 20 mEq by mouth 2 (two) times daily.  04/04/19   [provider]  sucralfate (CARAFATE) 1 g tablet Take 1 g by mouth 2 (two) times a day.     [provider]  sulfamethoxazole-trimethoprim (BACTRIM DS) 800-160 MG tablet Take 1 tablet by mouth every 12 (twelve) hours. 04/10/19   Zara Council A, PA-C  tamsulosin (FLOMAX) 0.4 MG CAPS capsule TAKE 1 CAPSULE BY MOUTH ONCE DAILY 04/29/19   McGowan, Larene Beach A, PA-C  tiZANidine (ZANAFLEX) 4 MG tablet Take 1 tablet (4 mg total) by mouth every 6 (six) hours as needed. Patient taking differently: Take 4 mg by mouth every 6 (six) hours as needed for muscle spasms.  03/16/19   Marin Olp, PA-C  vitamin B-12 (CYANOCOBALAMIN) 500 MCG tablet Take 500 mcg by mouth daily.    [provider]    Allergies Morphine and related, Other, and Tape  Family History  Problem Relation Age of Onset  . Cancer Father        cancer of lung, brain  and lymphoma  . Parkinson's disease Brother   . Mesothelioma Brother   . Kidney disease Neg Hx   . Prostate cancer Neg Hx   . Kidney cancer Neg Hx   . Bladder Cancer Neg Hx     Social History Social History   Tobacco Use  . Smoking status: Former Smoker    Types: Cigarettes    Quit date: 1998    Years since quitting: 22.7  . Smokeless tobacco: Never Used  Substance Use Topics  . Alcohol use: No     Alcohol/week: 0.0 standard drinks  . Drug use: No    Review of Systems  Constitutional: + fever. Eyes: Negative for visual changes. ENT: Negative for sore throat. Neck: No neck pain  Cardiovascular: Negative for chest pain. Respiratory: Negative for shortness of breath. Gastrointestinal: Negative for abdominal pain. + nausea, vomiting, and chronic diarrhea. Genitourinary: Negative for dysuria. Musculoskeletal: Negative for back pain. Skin: Negative for rash. Neurological: Negative for headaches, weakness or numbness. Psych: No SI or HI  ____________________________________________   PHYSICAL EXAM:  VITAL SIGNS: ED Triage Vitals  Enc Vitals Group     BP 05/20/19 0341 (!) 160/91     Pulse Rate 05/20/19 0341 94     Resp 05/20/19 0341 17     Temp 05/20/19 0341 (!) 102.8 F (39.3 C)     Temp Source 05/20/19  0341 Oral     SpO2 05/20/19 0341 95 %     Weight 05/20/19 0342 180 lb (81.6 kg)     Height 05/20/19 0342 5\' 10"  (1.778 m)     Head Circumference --      Peak Flow --      Pain Score 05/20/19 0342 3     Pain Loc --      Pain Edu? --      Excl. in [place]? --     Constitutional: Alert and oriented x 3. Well appearing and in no apparent distress. HEENT:      Head: Normocephalic and atraumatic.         Eyes: Conjunctivae are normal. Sclera is non-icteric.       Mouth/Throat: Mucous membranes are moist.       Neck: Supple with no signs of meningismus. Cardiovascular: Regular rate and rhythm. No murmurs, gallops, or rubs. 2+ symmetrical distal pulses are present in all extremities. No JVD. Respiratory: Normal respiratory effort. Lungs are clear to auscultation bilaterally. No wheezes, crackles, or rhonchi.  Gastrointestinal: Soft, non tender, and non distended with positive bowel sounds. No rebound or guarding. Genitourinary: No CVA tenderness. Musculoskeletal: Nontender with normal range of motion in all extremities. No edema, cyanosis, or erythema of extremities.  Neurologic: Normal speech and language. Face is symmetric. Moving all extremities. No gross focal neurologic deficits are appreciated. Skin: Skin is warm, dry and intact. No rash noted. Psychiatric: Mood and affect are normal. Speech and behavior are normal.  ____________________________________________   LABS (all labs ordered are listed, but only abnormal results are displayed)  Labs Reviewed  CBC WITH DIFFERENTIAL/PLATELET - Abnormal; Notable for the following components:      Result Value   WBC 3.4 (*)    Hemoglobin 11.3 (*)    HCT 35.0 (*)    MCV 77.3 (*)    MCH 24.9 (*)    Platelets 131 (*)    Lymphs Abs 0.3 (*)    All other components within normal limits  COMPREHENSIVE METABOLIC PANEL - Abnormal; Notable for the following components:   Potassium 3.3 (*)    Glucose, Bld 140 (*)    Calcium 8.8 (*)    Alkaline Phosphatase 150 (*)    Total Bilirubin 1.3 (*)    All other components within normal limits  SARS CORONAVIRUS 2 (HOSPITAL ORDER, [facility])  URINE CULTURE  CULTURE, BLOOD (ROUTINE X 2)  CULTURE, BLOOD (ROUTINE X 2)  LACTIC ACID, PLASMA  URINALYSIS, COMPLETE (UACMP) WITH MICROSCOPIC   ____________________________________________  EKG  ED ECG REPORT I, Rudene Re, the attending physician, personally viewed and interpreted this ECG.  Sinus tachycardia first-degree AV block, normal QTC, left axis deviation, no ST elevations or depressions. ____________________________________________  RADIOLOGY  I have personally reviewed the images performed during this visit and I agree with the Radiologist's read.   Interpretation by Radiologist:  Dg Chest Portable 1 View  Result Date: 05/20/2019 CLINICAL DATA:  Fever EXAM: PORTABLE CHEST 1 VIEW COMPARISON:  03/24/2019 FINDINGS: Normal heart size.  Mild aortic tortuosity. Streaky density at the lung bases. No edema, effusion, or pneumothorax. IMPRESSION: Streaky opacity at the bases  that could be atelectasis or bronchopneumonia. Electronically Signed   By: Monte Fantasia M.D.   On: 05/20/2019 04:23      ____________________________________________   PROCEDURES  Procedure(s) performed: None Procedures Critical Care performed: yes  CRITICAL CARE Performed by: Rudene Re  ?  Total critical care  time: 30 min  Critical care time was exclusive of separately billable procedures and treating other patients.  Critical care was necessary to treat or prevent imminent or life-threatening deterioration.  Critical care was time spent personally by me on the following activities: development of treatment plan with patient and/or surrogate as well as nursing, discussions with consultants, evaluation of patient's response to treatment, examination of patient, obtaining history from patient or surrogate, ordering and performing treatments and interventions, ordering and review of laboratory studies, ordering and review of radiographic studies, pulse oximetry and re-evaluation of patient's condition.  ____________________________________________   INITIAL IMPRESSION / ASSESSMENT AND PLAN / ED COURSE  78 y.o. male history of diverticulitis status post colectomy, kidney stones, peptic ulcer disease, hypertension, hyperlipidemia who presents for evaluation of fever.   Ddx COVID, UTI/ pyelo, PNA, sepsis.  Patient is alert and oriented x3, no meningeal signs, neurologically intact, soft abdomen, lungs are clear to auscultation with normal air movement, no crackles or wheezing, no rashes, no asymmetric pitting edema.  Plan for labs, lactic, urinalysis, EKG, chest x-ray, COVID swab.  Will give Tylenol and fluids.    _________________________ 5:58 AM on 05/20/2019 -----------------------------------------  Chest x-ray concerning for pneumonia.  With recent hospitalization will cover broadly for HCAP with cefepime and vancomycin.  Based on tachycardia, fever, tachypnea,  source of infection patient meets sepsis criteria.  Lactic is within normal limits.  COVID is negative.  Discussed with Dr. Sidney Ace for admission.    As part of my medical decision making, I reviewed the following data within the [place] notes reviewed and incorporated, Labs reviewed , EKG interpreted , Old EKG reviewed, Old chart reviewed, Radiograph reviewed , Discussed with admitting physician , Notes from prior ED visits and  Controlled Substance Database   Patient was evaluated in Emergency Department today for the symptoms described in the history of present illness. Patient was evaluated in the context of the global COVID-19 pandemic, which necessitated consideration that the patient might be at risk for infection with the SARS-CoV-2 virus that causes COVID-19. Institutional protocols and algorithms that pertain to the evaluation of patients at risk for COVID-19 are in a state of rapid change based on information released by regulatory bodies including the CDC and federal and state organizations. These policies and algorithms were followed during the patient's care in the ED.   ____________________________________________   FINAL CLINICAL IMPRESSION(S) / ED DIAGNOSES   Final diagnoses:  HCAP (healthcare-associated pneumonia)  Sepsis without acute organ dysfunction, due to unspecified organism [facility])      NEW MEDICATIONS STARTED DURING THIS VISIT:  ED Discharge Orders    None       Note:  This document was prepared using Dragon voice recognition software and may include unintentional dictation errors.    Alfred Levins, [place], MD 05/20/19 [phone]

## 2019-05-20 NOTE — Progress Notes (Signed)
CODE SEPSIS - PHARMACY COMMUNICATION  **Broad Spectrum Antibiotics should be administered within 1 hour of Sepsis diagnosis**  Time Code Sepsis Called/Page Received: @ 0549  Antibiotics Ordered: Cefepime and vancomycin   Time of 1st antibiotic administration: @0614   Additional action taken by pharmacy: N/A  If necessary, Name of Provider/Nurse Contacted: N/A   Pernell Dupre, PharmD, [facility] Pharmacist 05/20/2019 6:28 AM

## 2019-05-20 NOTE — H&P (Addendum)
[place] [facility] [place] NAME: Stephen Cervantes    MR#:  [id number]  DATE OF BIRTH:  12-16-1940  DATE OF ADMISSION:  05/20/2019  PRIMARY CARE PHYSICIAN: Jodi Marble, MD   REQUESTING/REFERRING PHYSICIAN: Gonzella Lex, MD  CHIEF COMPLAINT:   Chief Complaint  Patient presents with  . Fever    HISTORY OF PRESENT ILLNESS:  Stephen Cervantes  is a 78 y.o. Caucasian male with a known history of multiple medical problems that will be mentioned below, including asthma, depression, hypertension and dyslipidemia and hypothyroidism, who presented to the emergency room with acute onset of raging chills per his report with recent tachypnea and mild dyspnea without significant cough or wheezing.  He denied any rhinorrhea or nasal congestion or sore throat or earache.  No dysuria, oliguria or hematuria or flank pain.  No nausea vomiting or abdominal pain or diarrhea.  He had a back surgery a couple months ago.  Upon presentation to the emergency room, temperature was 102.8 and blood pressure 160/91 with pulse oximetry 95% on 2 L O2 by nasal cannula.  Labs were remarkable for hypokalemia of 3.3 and mild leukopenia of 3.4 with anemia and thrombocytopenia.  Alk phos was elevated 150 with total bili of 1.3.  COVID-19 test came back negative.  Blood cultures were sent.  Portable chest x-ray revealed bibasal opacities that could be related to atelectasis versus pneumonia.  EKG showed sinus tachycardia with rate 111 with LVH and T wave inversion in V1, V2 and aVL.  The patient was given IV cefepime and vancomycin, 1 g of p.o. Tylenol and 1 L of IV normal saline bolus.  He will be admitted to medically monitored bed for further evaluation and management. PAST MEDICAL HISTORY:   Past Medical History:  Diagnosis Date  . Arthritis   . Asthma   . Bowel obstruction ([place]) 7829,5621  . Depression   . Diverticulitis 2006  . Enlarged prostate   . Gallstones  07/04/2014  . History of gastrointestinal ulcer   . History of kidney stones   . HLD (hyperlipidemia)   . HTN (hypertension)   . Kidney stone    kidney stones  . Neuritis or radiculitis due to rupture of lumbar intervertebral disc 05/29/2015  . Thyroid disease     PAST SURGICAL HISTORY:   Past Surgical History:  Procedure Laterality Date  . COLECTOMY  2006   Abdominal colectomy, ileostomy, Hartman procedure for perforation of cecum, sigmoid diverticulitis  . HERNIA REPAIR  2008?   [place]  . ILEOSTOMY  2007  . JOINT REPLACEMENT  2014   Hip Replacement Hessie Knows  . KNEE SURGERY Right 2011  . POSTERIOR LUMBAR FUSION 4 LEVEL N/A 03/13/2019   Procedure: POSTERIOR LUMBAR FUSION 4 LEVEL L1-5;  Surgeon: Meade Maw, MD;  Location: [facility] ORS;  Service: Neurosurgery;  Laterality: N/A;    SOCIAL HISTORY:   Social History   Tobacco Use  . Smoking status: Former Smoker    Types: Cigarettes    Quit date: 1998    Years since quitting: 22.7  . Smokeless tobacco: Never Used  Substance Use Topics  . Alcohol use: No    Alcohol/week: 0.0 standard drinks    FAMILY HISTORY:   Family History  Problem Relation Age of Onset  . Cancer Father        cancer of lung, brain  and lymphoma  . Parkinson's disease Brother   . Mesothelioma Brother   .  Kidney disease Neg Hx   . Prostate cancer Neg Hx   . Kidney cancer Neg Hx   . Bladder Cancer Neg Hx     DRUG ALLERGIES:   Allergies  Allergen Reactions  . Morphine And Related Other (See Comments)    Severe hallucinations  . Other Other (See Comments)    Silk tape - Makes skin peel  . Tape Other (See Comments)    Silk Tape per patient "peels off my skin"    REVIEW OF SYSTEMS:   ROS As per history of present illness. All pertinent systems were reviewed above. Constitutional,  HEENT, cardiovascular, respiratory, GI, GU, musculoskeletal, neuro, psychiatric, endocrine,  integumentary and hematologic  systems were reviewed and are otherwise  negative/unremarkable except for positive findings mentioned above in the HPI.   MEDICATIONS AT HOME:   Prior to Admission medications   Medication Sig Start Date End Date Taking? Authorizing Provider  acetaminophen (TYLENOL) 325 MG tablet Take 2 tablets (650 mg total) by mouth every 4 (four) hours as needed for mild pain ((score 1 to 3) or temp > 100.5). 03/16/19  Yes Marin Olp, PA-C  atorvastatin (LIPITOR) 10 MG tablet Take 10 mg by mouth daily.    Yes [provider]  celecoxib (CELEBREX) 200 MG capsule Take 1 capsule (200 mg total) by mouth daily. 03/16/19  Yes Marin Olp, PA-C  finasteride (PROSCAR) 5 MG tablet Take 1 tablet (5 mg total) by mouth daily. 04/09/19  Yes McGowan, Larene Beach A, PA-C  hydrochlorothiazide (HYDRODIURIL) 25 MG tablet Take 25 mg by mouth daily.  06/26/14  Yes [provider]  levothyroxine (SYNTHROID) 150 MCG tablet Take 150 mcg by mouth daily before breakfast.   Yes [provider]  loperamide (IMODIUM) 2 MG capsule Take 4 mg by mouth 3 (three) times daily as needed for diarrhea or loose stools.    Yes [provider]  loratadine (CLARITIN) 10 MG tablet Take 10 mg by mouth daily as needed for allergies.    Yes [provider]  montelukast (SINGULAIR) 10 MG tablet Take 10 mg by mouth daily.  06/26/14  Yes [provider]  pantoprazole (PROTONIX) 40 MG tablet Take 40 mg by mouth daily before breakfast.  08/09/18  Yes [provider]  potassium citrate (UROCIT-K) 10 MEQ (1080 MG) SR tablet Take 20 mEq by mouth 2 (two) times daily.  04/04/19  Yes [provider]  sucralfate (CARAFATE) 1 g tablet Take 1 g by mouth 2 (two) times a day.    Yes [provider]  tamsulosin (FLOMAX) 0.4 MG CAPS capsule TAKE 1 CAPSULE BY MOUTH ONCE DAILY 04/29/19  Yes McGowan, Larene Beach A, PA-C  tiZANidine (ZANAFLEX) 4 MG tablet Take 1 tablet (4 mg total) by mouth every 6 (six)  hours as needed. Patient taking differently: Take 4 mg by mouth every 6 (six) hours as needed for muscle spasms.  03/16/19  Yes Marin Olp, PA-C  vitamin B-12 (CYANOCOBALAMIN) 500 MCG tablet Take 500 mcg by mouth daily.   Yes [provider]  sulfamethoxazole-trimethoprim (BACTRIM DS) 800-160 MG tablet Take 1 tablet by mouth every 12 (twelve) hours. Patient not taking: Reported on 05/20/2019 04/10/19   Zara Council A, PA-C      VITAL SIGNS:  Blood pressure (!) 146/78, pulse (!) 101, temperature (!) 102.8 F (39.3 C), temperature source Oral, resp. rate (!) 31, height '5\' 10"'  (1.778 m), weight 81.6 kg, SpO2 98 %.  PHYSICAL EXAMINATION:  Physical Exam  GENERAL:  78  y.o.-year-old Caucasian pleasant male patient lying in the bed with no acute distress.  EYES: Pupils equal, round, reactive to light and accommodation. No scleral icterus. Extraocular muscles intact.  HEENT: Head atraumatic, normocephalic. Oropharynx and nasopharynx clear.  NECK:  Supple, no jugular venous distention. No thyroid enlargement, no tenderness.  LUNGS: Diminished bibasilar breath sounds with bibasal crackles. CARDIOVASCULAR: Regular rate and rhythm, S1, S2 normal. No murmurs, rubs, or gallops.  ABDOMEN: Soft, nondistended, nontender. Bowel sounds present. No organomegaly or mass.  EXTREMITIES: No pedal edema, cyanosis, or clubbing.  NEUROLOGIC: Cranial nerves II through XII are intact. Muscle strength 5/5 in all extremities. Sensation intact. Gait not checked.  PSYCHIATRIC: The patient is alert and oriented x 3.  Normal affect and good eye contact. SKIN: No obvious rash, lesion, or ulcer.   LABORATORY PANEL:   CBC Recent Labs  Lab 05/20/19 0406  WBC 3.4*  HGB 11.3*  HCT 35.0*  PLT 131*   ------------------------------------------------------------------------------------------------------------------  Chemistries  Recent Labs  Lab 05/20/19 0406  NA 136  K 3.3*  CL 106  CO2 23  GLUCOSE  140*  BUN 16  CREATININE 0.97  CALCIUM 8.8*  AST 39  ALT 32  ALKPHOS 150*  BILITOT 1.3*   ------------------------------------------------------------------------------------------------------------------  Cardiac Enzymes No results for input(s): TROPONINI in the last 168 hours. ------------------------------------------------------------------------------------------------------------------  RADIOLOGY:  Dg Chest Portable 1 View  Result Date: 05/20/2019 CLINICAL DATA:  Fever EXAM: PORTABLE CHEST 1 VIEW COMPARISON:  03/24/2019 FINDINGS: Normal heart size.  Mild aortic tortuosity. Streaky density at the lung bases. No edema, effusion, or pneumothorax. IMPRESSION: Streaky opacity at the bases that could be atelectasis or bronchopneumonia. Electronically Signed   By: Monte Fantasia M.D.   On: 05/20/2019 04:23      IMPRESSION AND PLAN:   1.  Bibasal pneumonia with subsequent sepsis. -The patient will be admitted to a medically monitored bed. -He will be placed on IV Rocephin and Zithromax.   -Mucolytic therapy be provided as well as duo nebs q.i.d. and q.4 hours p.r.n.Marland Kitchen - Sputum Gram stain culture and sensitivity will be obtained.  Blood cultures will be followed. - If his culture is positive for Pseudomonas we will switch his IV Rocephin to cefepime given the fact that he was hospitalized here in early August. -He will be hydrated with IV normal saline. - We have ordered pneumonia antigens and influenza antigens. -Urinalysis came back with 6-10 WBCs and many bacteria.  Possible UTI should be covered with IV Rocephin pending confirmation with urine culture.  2.  Hypokalemia.  Replace potassium and check magnesium level.  3.  Hypothyroidism.  Synthroid will be continued.  4.  Hypertension.  We will continue HCTZ.  5.  Dyslipidemia.  We will continue his statin therapy.  6.  GERD.  PPI therapy and Carafate will be resumed.  7.  Asthma.  We will continue Singulair in addition to  nebulized DuoNebs.  8.  DVT prophylaxis.  Subcutaneous Lovenox.     All the records are reviewed and case discussed with ED provider. The plan of care was discussed in details with the patient (and family). I answered all questions. The patient agreed to proceed with the above mentioned plan. Further management will depend upon hospital course.   CODE STATUS: Full code  TOTAL TIME TAKING CARE OF THIS PATIENT: 50 minutes.    Christel Mormon M.D on 05/20/2019 at [place] AM  Pager - [phone]  After 6pm go to [email or web address] - [email or web address]  [facility]  Office  [phone]  CC: Primary care physician; Jodi Marble, MD   Note: This dictation was prepared with Dragon dictation along with smaller phrase technology. Any transcriptional errors that result from this process are unintentional.

## 2019-05-20 NOTE — ED Triage Notes (Signed)
Pt arrives via [facility] with c/o fever x 1 day per spouse. Pt had 102.4 temp for EMS, BP 160/91, CBG 125, PR 130, and 88% RA. Pt has new onset confusion at this time. EMS also reports diminished lung sounds in both lower lung bases. Pt is alert x 4 at this time.

## 2019-05-21 LAB — COMPREHENSIVE METABOLIC PANEL
ALT: 27 U/L (ref 0–44)
AST: 33 U/L (ref 15–41)
Albumin: 3.1 g/dL — ABNORMAL LOW (ref 3.5–5.0)
Alkaline Phosphatase: 123 U/L (ref 38–126)
Anion gap: 8 (ref 5–15)
BUN: 12 mg/dL (ref 8–23)
CO2: 24 mmol/L (ref 22–32)
Calcium: 8.3 mg/dL — ABNORMAL LOW (ref 8.9–10.3)
Chloride: 104 mmol/L (ref 98–111)
Creatinine, Ser: 0.98 mg/dL (ref 0.61–1.24)
GFR calc Af Amer: >60 mL/min (ref >60)
GFR calc non Af Amer: >60 mL/min (ref >60)
Glucose, Bld: 108 mg/dL — ABNORMAL HIGH (ref 70–99)
Potassium: 3.2 mmol/L — ABNORMAL LOW (ref 3.5–5.1)
Sodium: 136 mmol/L (ref 135–145)
Total Bilirubin: 0.8 mg/dL (ref 0.3–1.2)
Total Protein: 6 g/dL — ABNORMAL LOW (ref 6.5–8.1)

## 2019-05-21 LAB — CBC
HCT: 33 % — ABNORMAL LOW (ref 39.0–52.0)
Hemoglobin: 10.5 g/dL — ABNORMAL LOW (ref 13.0–17.0)
MCH: 25.1 pg — ABNORMAL LOW (ref 26.0–34.0)
MCHC: 31.8 g/dL (ref 30.0–36.0)
MCV: 78.8 fL — ABNORMAL LOW (ref 80.0–100.0)
Platelets: 113 10*3/uL — ABNORMAL LOW (ref 150–400)
RBC: 4.19 MIL/uL — ABNORMAL LOW (ref 4.22–5.81)
RDW: 14.1 % (ref 11.5–15.5)
WBC: 4.2 10*3/uL (ref 4.0–10.5)
nRBC: 0 % (ref 0.0–0.2)

## 2019-05-21 LAB — LEGIONELLA PNEUMOPHILA SEROGP 1 UR AG: L. pneumophila Serogp 1 Ur Ag: NEGATIVE

## 2019-05-21 MED ORDER — AZITHROMYCIN 500 MG PO TABS
500.0000 mg | ORAL_TABLET | Freq: Every day | ORAL | Status: AC
  Administered 2019-05-22: 500 mg via ORAL
  Filled 2019-05-21: qty 1

## 2019-05-21 NOTE — Progress Notes (Signed)
[place] at [place] NAME: Stephen Cervantes    MR#:  [id number]  DATE OF BIRTH:  [birth year]  SUBJECTIVE: Patient is admitted for pneumonia.  Seen this morning says he is feeling weak, has some cough.  But denies any other complaints.  CHIEF COMPLAINT:   Chief Complaint  Patient presents with  . Fever    REVIEW OF SYSTEMS:   ROS CONSTITUTIONAL: Generalized weakness.  EYES: No blurred or double vision.  EARS, NOSE, AND THROAT: No tinnitus or ear pain.  RESPIRATORY: No cough, shortness of breath, wheezing or hemoptysis.  CARDIOVASCULAR: No chest pain, orthopnea, edema.  GASTROINTESTINAL: No nausea, vomiting, diarrhea or abdominal pain.  Has poor appetite GENITOURINARY: No dysuria, hematuria.  ENDOCRINE: No polyuria, nocturia,  HEMATOLOGY: No anemia, easy bruising or bleeding SKIN: No rash or lesion. MUSCULOSKELETAL: No joint pain or arthritis.   NEUROLOGIC: No tingling, numbness, weakness.  PSYCHIATRY: Appears very anxious.  DRUG ALLERGIES:   Allergies  Allergen Reactions  . Morphine And Related Other (See Comments)    Severe hallucinations  . Other Other (See Comments)    Silk tape - Makes skin peel  . Tape Other (See Comments)    Silk Tape per patient "peels off my skin"    VITALS:  Blood pressure 136/80, pulse 85, temperature 98.6 F (37 C), temperature source Oral, resp. rate 18, height 5\' 10"  (1.778 m), weight 81.6 kg, SpO2 94 %.  PHYSICAL EXAMINATION:  GENERAL:  78 y.o.-year-old patient lying in the bed with no acute distress.  EYES: Pupils equal, round, reactive to light and accommodation. No scleral icterus. Extraocular muscles intact.  HEENT: Head atraumatic, normocephalic. Oropharynx and nasopharynx clear.  NECK:  Supple, no jugular venous distention. No thyroid enlargement, no tenderness.  LUNGS: Normal breath sounds bilaterally, no wheezing, rales,rhonchi or crepitation. No use of accessory muscles of  respiration.  CARDIOVASCULAR: S1, S2 normal. No murmurs, rubs, or gallops.  ABDOMEN: Soft, nontender, nondistended. Bowel sounds present. No organomegaly or mass.  EXTREMITIES: No pedal edema, cyanosis, or clubbing.  NEUROLOGIC: Cranial nerves II through XII are intact. Muscle strength 5/5 in all extremities. Sensation intact. Gait not checked.  PSYCHIATRIC: The patient is alert and oriented x 3.  SKIN: No obvious rash, lesion, or ulcer.    LABORATORY PANEL:   CBC Recent Labs  Lab 05/21/19 0300  WBC 4.2  HGB 10.5*  HCT 33.0*  PLT 113*   ------------------------------------------------------------------------------------------------------------------  Chemistries  Recent Labs  Lab 05/21/19 0300  NA 136  K 3.2*  CL 104  CO2 24  GLUCOSE 108*  BUN 12  CREATININE 0.98  CALCIUM 8.3*  AST 33  ALT 27  ALKPHOS 123  BILITOT 0.8   ------------------------------------------------------------------------------------------------------------------  Cardiac Enzymes No results for input(s): TROPONINI in the last 168 hours. ------------------------------------------------------------------------------------------------------------------  RADIOLOGY:  Dg Chest Portable 1 View  Result Date: 05/20/2019 CLINICAL DATA:  Fever EXAM: PORTABLE CHEST 1 VIEW COMPARISON:  03/24/2019 FINDINGS: Normal heart size.  Mild aortic tortuosity. Streaky density at the lung bases. No edema, effusion, or pneumothorax. IMPRESSION: Streaky opacity at the bases that could be atelectasis or bronchopneumonia. Electronically Signed   By: Monte Fantasia M.D.   On: 05/20/2019 04:23    EKG:   Orders placed or performed during the hospital encounter of 05/20/19  . EKG 12-Lead  . EKG 12-Lead  . ED EKG 12-Lead  . ED EKG 12-Lead    ASSESSMENT AND PLAN:   #1 sepsis with bibasilar pneumonia,  continue IV antibiotics, improving slowly, no leukocytosis, #2. UTI, , continue IV Rocephin.  Urine cultures shows more  than 100,000 colonies of gram-negative rods but sensitivities are pending. Acute kidney injury on admission, improved, decrease IV fluids 4.  Hypokalemia, replace potassium. 6.  Likely due to sepsis with UTI and not eating well, sugars are 82.  No diabetes.  Monitor closely. Out of bed to chair, possible discharge tomorrow with antibiotics for UTI, pneumonia.  Waiting for urine culture sensitivity results.  All the records are reviewed and case discussed with Care Management/Social Workerr. Management plans discussed with the patient, family and they are in agreement.  CODE STATUS: Full code  TOTAL TIME TAKING CARE OF THIS PATIENT: 38 minutes.  More than 50% time spent in counseling, coordination of care POSSIBLE D/C IN 100.  DAYS, DEPENDING ON CLINICAL CONDITION.   Epifanio Lesches M.D on 05/21/2019 at 9:35 AM  Between 7am to 6pm - Pager - [phone]  After 6pm go to [email or web address] - password [email or web address] [facility]  Office  [phone]  CC: Primary care physician; Jodi Marble, MD   Note: This dictation was prepared with Dragon dictation along with smaller phrase technology. Any transcriptional errors that result from this process are unintentional.

## 2019-05-21 NOTE — Evaluation (Signed)
Physical Therapy Evaluation Patient Details Name: Stephen Cervantes MRN: [id number] DOB: 06-11-1941 Today's Date: 05/21/2019   History of Present Illness  presented to ER secondary to chills, mild dyspnea and fever; admitted for management of sepsis related to bibasilar PNA. Of note, PMH significant for L1-5 fusion (03/2019)  Clinical Impression  Upon evaluation, patient alert and oriented; follows commands and demonstrates good effort with all mobility tasks.  Bilat UE/LE strength and ROM grossly symmetrical and WFL; no focal weakness appreciated.  Demonstrates ability to complete bed mobility with indep; sit/stand, basic transfers and gait (200') without assist device, mod indep.  Good gait mechanics, good stability; easily completes dynamic gait components without difficulty, gait deviation or safety concern. Sats maintained 97-98% on RA at rest and with exertion; minimal/no SOB noted.  Left on RA end of session; RN informed/aware. Appears to be at baseline level of functional ability without acute need for PT identified at this time. Did encouraged continued in room/on unit mobility with staff as appropriate to maintain functional endurance. Patient voiced understanding/agreement. Will complete order at this time; please re-consult should needs change.    Follow Up Recommendations No PT follow up    Equipment Recommendations       Recommendations for Other Services       Precautions / Restrictions Precautions Precautions: Fall;Back Restrictions Weight Bearing Restrictions: No      Mobility  Bed Mobility Overal bed mobility: Modified Independent                Transfers Overall transfer level: Modified independent Equipment used: None                Ambulation/Gait Ambulation/Gait assistance: Modified independent (Device/Increase time) Gait Distance (Feet): 200 Feet         General Gait Details: reciprocal stepping pattern, good step height/length; completes  dynamic gait components without balance deficit/gait deviation  Stairs            Wheelchair Mobility    Modified Rankin (Stroke Patients Only)       Balance Overall balance assessment: Modified Independent                                           Pertinent Vitals/Pain Pain Assessment: No/denies pain    Home Living Family/patient expects to be discharged to:: Private residence Living Arrangements: Spouse/significant other Available Help at Discharge: Family Type of Home: House Home Access: Ramped entrance     Home Layout: One level Home Equipment: None      Prior Function Level of Independence: Independent         Comments: Weaned from assist device after back surgery     Hand Dominance        Extremity/Trunk Assessment   Upper Extremity Assessment Upper Extremity Assessment: Overall WFL for tasks assessed    Lower Extremity Assessment Lower Extremity Assessment: Overall WFL for tasks assessed       Communication   Communication: No difficulties  Cognition Arousal/Alertness: Awake/alert Behavior During Therapy: WFL for tasks assessed/performed Overall Cognitive Status: Within Functional Limits for tasks assessed                                        General Comments      Exercises  Assessment/Plan    PT Assessment Patent does not need any further PT services  PT Problem List         PT Treatment Interventions      PT Goals (Current goals can be found in the Care Plan section)  Acute Rehab PT Goals Patient Stated Goal: to return home PT Goal Formulation: With patient Time For Goal Achievement: 06/04/19 Potential to Achieve Goals: Good    Frequency     Barriers to discharge        Co-evaluation               AM-PAC PT "6 Clicks" Mobility  Outcome Measure Help needed turning from your back to your side while in a flat bed without using bedrails?: None Help needed moving from  lying on your back to sitting on the side of a flat bed without using bedrails?: None Help needed moving to and from a bed to a chair (including a wheelchair)?: None Help needed standing up from a chair using your arms (e.g., wheelchair or bedside chair)?: None Help needed to walk in hospital room?: None Help needed climbing 3-5 steps with a railing? : None 6 Click Score: 24    End of Session Equipment Utilized During Treatment: Gait belt Activity Tolerance: Patient tolerated treatment well Patient left: in chair;with call bell/phone within reach Nurse Communication: Mobility status PT Visit Diagnosis: Difficulty in walking, not elsewhere classified (R26.2);Muscle weakness (generalized) (M62.81)    Time: [id number] PT Time Calculation (min) (ACUTE ONLY): 12 min   Charges:   PT Evaluation $PT Eval Low Complexity: 1 Low         Daishia Fetterly H. Owens Shark, PT, DPT, NCS 05/21/19, 12:34 PM [phone]

## 2019-05-22 LAB — HIV ANTIBODY (ROUTINE TESTING W REFLEX): HIV Screen 4th Generation wRfx: NONREACTIVE

## 2019-05-22 MED ORDER — CIPROFLOXACIN HCL 500 MG PO TABS: 500.0000 mg | ORAL_TABLET | Freq: Every day | ORAL | 0 refills | Status: AC

## 2019-05-22 MED ORDER — POTASSIUM CHLORIDE CRYS ER 20 MEQ PO TBCR
40.0000 meq | EXTENDED_RELEASE_TABLET | Freq: Once | ORAL | Status: AC
  Administered 2019-05-22: 40 meq via ORAL
  Filled 2019-05-22: qty 2

## 2019-05-22 MED ORDER — IPRATROPIUM-ALBUTEROL 0.5-2.5 (3) MG/3ML IN SOLN
3.0000 mL | Freq: Three times a day (TID) | RESPIRATORY_TRACT | Status: DC
  Administered 2019-05-22: 3 mL via RESPIRATORY_TRACT
  Filled 2019-05-22: qty 3

## 2019-05-22 NOTE — Progress Notes (Signed)
Patient urine cultures are showing Enterobacter, sensitive to Cipro.  Received 4 days of Rocephin in the hospital will cover bacterial pneumonias and also UTI.  Discharge home with Cipro for 7 days, advised to continue his medicines for BPH, he told me that he takes Flomax but I do not see that in his home medication list. Discharge instructions are in the computer, spoke with patient's significant other.Marland Kitchen

## 2019-05-22 NOTE — Plan of Care (Signed)
Pt d/ced home.  

## 2019-05-23 LAB — URINE CULTURE: Culture: >=100000 — AB

## 2019-05-25 LAB — CULTURE, BLOOD (ROUTINE X 2)
Culture: NO GROWTH
Culture: NO GROWTH
Special Requests: ADEQUATE

## 2019-05-26 NOTE — Discharge Summary (Signed)
Stephen Cervantes, is a 78 y.o. male  DOB July 03, 1941  MRN [id number].  Admission date:  05/20/2019  Admitting Physician  Christel Mormon, MD  Discharge Date:  05/22/2019   Primary MD  Jodi Marble, MD  Recommendations for primary care physician for things to follow:    Follow with PCP in one week Admission Diagnosis  HCAP (healthcare-associated pneumonia) [J18.9] Sepsis without acute organ dysfunction, due to unspecified organism [facility]) [A41.9]   Discharge Diagnosis  HCAP (healthcare-associated pneumonia) [J18.9] Sepsis without acute organ dysfunction, due to unspecified organism [facility]) [A41.9]    Active Problems:   HCAP (healthcare-associated pneumonia)      Past Medical History:  Diagnosis Date  . Arthritis   . Asthma   . Bowel obstruction ([place]) [id number]  . Depression   . Diverticulitis 2006  . Enlarged prostate   . Gallstones 07/04/2014  . History of gastrointestinal ulcer   . History of kidney stones   . HLD (hyperlipidemia)   . HTN (hypertension)   . Kidney stone    kidney stones  . Neuritis or radiculitis due to rupture of lumbar intervertebral disc 05/29/2015  . Thyroid disease     Past Surgical History:  Procedure Laterality Date  . COLECTOMY  2006   Abdominal colectomy, ileostomy, Hartman procedure for perforation of cecum, sigmoid diverticulitis  . HERNIA REPAIR  2008?   [place]  . ILEOSTOMY  2007  . JOINT REPLACEMENT  2014   Hip Replacement Hessie Knows  . KNEE SURGERY Right 2011  . POSTERIOR LUMBAR FUSION 4 LEVEL N/A 03/13/2019   Procedure: POSTERIOR LUMBAR FUSION 4 LEVEL L1-5;  Surgeon: Meade Maw, MD;  Location: [facility] ORS;  Service: Neurosurgery;  Laterality: N/A;       History of present illness and  Hospital Course:     Kindly see H&P for history of  present illness and admission details, please review complete Labs, Consult reports and Test reports for all details in brief  HPI  from the history and physical done on the day of admission 58 ur old male  With h/o depression,HTn,asthma  Presented to ER with couh,fever,generalized weakness ,found to have pneumonia ,UTI,Admitted for the same.   Hospital Course  1 .sepsispresent on Admisson   bibasilar pneumonia,. Received IV rocephin and Zithromycin., for 3 days.. #2. Enterococcal UTI;discharged home with cipro  500 mg po bid for 7 days. , .  Urine cultures shows more than 100,000 colonies of gram-negative rods but sensitivities are pending. Acute kidney injury on admission, improved, decrease IV fluids 4.  Hypokalemia, replaced potassium.Marland Kitchen 5.Borderline BG  On admission due to sepsis and pt not eating well.symptoms improved and he ate well and we discharged him home.  6.BPH;continue Flomax and finasteride.   Discharge Condition: Stable   Follow UP  Follow-up Information    Jodi Marble, MD. Schedule an appointment as soon as possible for a visit in 1 week(s).   Specialty: Internal Medicine Contact information: [address] [place] [address] [phone]             Discharge Instructions  and  Discharge Medications     Allergies as of 05/22/2019      Reactions   Morphine And Related Other (See Comments)   Severe hallucinations   Other Other (See Comments)   Silk tape - Makes skin peel   Tape Other (See Comments)   Silk Tape per patient "peels off my skin"  Medication List    STOP taking these medications   sulfamethoxazole-trimethoprim 800-160 MG tablet Commonly known as: BACTRIM DS     TAKE these medications   acetaminophen 325 MG tablet Commonly known as: TYLENOL Take 2 tablets (650 mg total) by mouth every 4 (four) hours as needed for mild pain ((score 1 to 3) or temp > 100.5).   atorvastatin 10 MG tablet Commonly known as:  LIPITOR Take 10 mg by mouth daily.   celecoxib 200 MG capsule Commonly known as: CELEBREX Take 1 capsule (200 mg total) by mouth daily.   ciprofloxacin 500 MG tablet Commonly known as: Cipro Take 1 tablet (500 mg total) by mouth daily with breakfast for 7 days.   finasteride 5 MG tablet Commonly known as: Proscar Take 1 tablet (5 mg total) by mouth daily.   hydrochlorothiazide 25 MG tablet Commonly known as: HYDRODIURIL Take 25 mg by mouth daily.   levothyroxine 150 MCG tablet Commonly known as: SYNTHROID Take 150 mcg by mouth daily before breakfast.   loperamide 2 MG capsule Commonly known as: IMODIUM Take 4 mg by mouth 3 (three) times daily as needed for diarrhea or loose stools.   loratadine 10 MG tablet Commonly known as: CLARITIN Take 10 mg by mouth daily as needed for allergies.   montelukast 10 MG tablet Commonly known as: SINGULAIR Take 10 mg by mouth daily.   pantoprazole 40 MG tablet Commonly known as: PROTONIX Take 40 mg by mouth daily before breakfast.   potassium citrate 10 MEQ (1080 MG) SR tablet Commonly known as: UROCIT-K Take 20 mEq by mouth 2 (two) times daily.   sucralfate 1 g tablet Commonly known as: CARAFATE Take 1 g by mouth 2 (two) times a day.   tamsulosin 0.4 MG Caps capsule Commonly known as: FLOMAX TAKE 1 CAPSULE BY MOUTH ONCE DAILY   tiZANidine 4 MG tablet Commonly known as: ZANAFLEX Take 1 tablet (4 mg total) by mouth every 6 (six) hours as needed. What changed: reasons to take this   vitamin B-12 500 MCG tablet Commonly known as: CYANOCOBALAMIN Take 500 mcg by mouth daily.         Diet and Activity recommendation: See Discharge Instructions above   Consults obtained -none   Major procedures and Radiology Reports - PLEASE review detailed and final reports for all details, in brief -     Dg Chest Portable 1 View  Result Date: 05/20/2019 CLINICAL DATA:  Fever EXAM: PORTABLE CHEST 1 VIEW COMPARISON:  03/24/2019  FINDINGS: Normal heart size.  Mild aortic tortuosity. Streaky density at the lung bases. No edema, effusion, or pneumothorax. IMPRESSION: Streaky opacity at the bases that could be atelectasis or bronchopneumonia. Electronically Signed   By: Monte Fantasia M.D.   On: 05/20/2019 04:23    Micro Results     Recent Results (from the past 240 hour(s))  Blood culture (routine x 2)     Status: None   Collection Time: 05/20/19  4:07 AM   Specimen: BLOOD  Result Value Ref Range Status   Specimen Description BLOOD LEFT FOREARM  Final   Special Requests   Final    BOTTLES DRAWN AEROBIC AND ANAEROBIC Blood Culture results may not be optimal due to an excessive volume of blood received in culture bottles   Culture   Final    NO GROWTH 5 DAYS Performed at [facility], [address], [place], [place] [address]    Report Status 05/25/2019 FINAL  Final  Blood culture (  routine x 2)     Status: None   Collection Time: 05/20/19  4:16 AM   Specimen: BLOOD  Result Value Ref Range Status   Specimen Description BLOOD LEFT HAND  Final   Special Requests   Final    BOTTLES DRAWN AEROBIC AND ANAEROBIC Blood Culture adequate volume   Culture   Final    NO GROWTH 5 DAYS Performed at [facility], [address] [place], [place] [address]    Report Status 05/25/2019 FINAL  Final  SARS Coronavirus 2 [facility] order, Performed in [facility] hospital lab) Nasopharyngeal Nasopharyngeal Swab     Status: None   Collection Time: 05/20/19  4:16 AM   Specimen: Nasopharyngeal Swab  Result Value Ref Range Status   SARS Coronavirus 2 NEGATIVE NEGATIVE Final    Comment: (NOTE) If result is NEGATIVE SARS-CoV-2 target nucleic acids are NOT DETECTED. The SARS-CoV-2 RNA is generally detectable in upper and lower  respiratory specimens during the acute phase of infection. The lowest  concentration of SARS-CoV-2 viral copies this assay can detect is 250  copies / mL. A negative result does not  preclude SARS-CoV-2 infection  and should not be used as the sole basis for treatment or other  patient management decisions.  A negative result may occur with  improper specimen collection / handling, submission of specimen other  than nasopharyngeal swab, presence of viral mutation(s) within the  areas targeted by this assay, and inadequate number of viral copies  (<250 copies / mL). A negative result must be combined with clinical  observations, patient history, and epidemiological information. If result is POSITIVE SARS-CoV-2 target nucleic acids are DETECTED. The SARS-CoV-2 RNA is generally detectable in upper and lower  respiratory specimens dur ing the acute phase of infection.  Positive  results are indicative of active infection with SARS-CoV-2.  Clinical  correlation with patient history and other diagnostic information is  necessary to determine patient infection status.  Positive results do  not rule out bacterial infection or co-infection with other viruses. If result is PRESUMPTIVE POSTIVE SARS-CoV-2 nucleic acids MAY BE PRESENT.   A presumptive positive result was obtained on the submitted specimen  and confirmed on repeat testing.  While 2019 novel coronavirus  (SARS-CoV-2) nucleic acids may be present in the submitted sample  additional confirmatory testing may be necessary for epidemiological  and / or clinical management purposes  to differentiate between  SARS-CoV-2 and other Sarbecovirus currently known to infect humans.  If clinically indicated additional testing with an alternate test  methodology [phone]) is advised. The SARS-CoV-2 RNA is generally  detectable in upper and lower respiratory sp ecimens during the acute  phase of infection. The expected result is Negative. Fact Sheet for Patients:  StrictlyIdeas.no Fact Sheet for Healthcare Providers: BankingDealers.co.za This test is not yet approved or cleared by  the [place] FDA and has been authorized for detection and/or diagnosis of SARS-CoV-2 by FDA under an Emergency Use Authorization (EUA).  This EUA will remain in effect (meaning this test can be used) for the duration of the COVID-19 declaration under Section 564(b)(1) of the Act, 21 U.S.C. section 360bbb-3(b)(1), unless the authorization is terminated or revoked sooner. Performed at [facility], [address] [place], [place] [address]   Urine Culture     Status: Abnormal   Collection Time: 05/20/19  6:48 AM   Specimen: Urine, Random  Result Value Ref Range Status   Specimen Description   Final  URINE, RANDOM Performed at [facility], [address] [place], [place] [address]    Special Requests   Final    NONE Performed at [facility], [address] [place], [place] [address]    Culture >=100,000 COLONIES/mL ENTEROBACTER SPECIES (A)  Final   Report Status 05/23/2019 FINAL  Final   Organism ID, Bacteria ENTEROBACTER SPECIES (A)  Final      Susceptibility   Enterobacter species - MIC*    CEFAZOLIN RESISTANT Resistant     CEFTRIAXONE <=1 SENSITIVE Sensitive     CIPROFLOXACIN <=0.25 SENSITIVE Sensitive     GENTAMICIN <=1 SENSITIVE Sensitive     IMIPENEM 0.5 SENSITIVE Sensitive     NITROFURANTOIN 32 SENSITIVE Sensitive     TRIMETH/SULFA <=20 SENSITIVE Sensitive     PIP/TAZO <=4 SENSITIVE Sensitive     * >=100,000 COLONIES/mL ENTEROBACTER SPECIES       Today   Subjective:   Reice Swirsky today has no headache,no chest abdominal pain,no new weakness tingling or numbness, feels much better wants to go home today.  Objective:   Blood pressure (!) 144/83, pulse 74, temperature 98.7 F (37.1 C), temperature source Oral, resp. rate 15, height 5\' 10"  (1.778 m), weight 81.6 kg, SpO2 97 %.  No intake or output data in the 24 hours ending 05/26/19 1201  Exam Awake Alert, Oriented x 3, No new F.N deficits, Normal  affect [place].AT,PERRAL Supple Neck,No JVD, No cervical lymphadenopathy appriciated.  Symmetrical Chest wall movement, Good air movement bilaterally, CTAB RRR,No Gallops,Rubs or new Murmurs, No Parasternal Heave +ve B.Sounds, Abd Soft, Non tender, No organomegaly appriciated, No rebound -guarding or rigidity. No Cyanosis, Clubbing or edema, No new Rash or bruise  Data Review   CBC w Diff:  Lab Results  Component Value Date   WBC 4.2 05/21/2019   HGB 10.5 (L) 05/21/2019   HGB 14.6 04/07/2014   HGB 16.7 01/07/2009   HCT 33.0 (L) 05/21/2019   HCT 43.5 04/07/2014   HCT 48.0 01/07/2009   PLT 113 (L) 05/21/2019   PLT 142 (L) 04/07/2014   PLT 182 01/07/2009   LYMPHOPCT 7 05/20/2019   LYMPHOPCT 27.5 04/07/2014   LYMPHOPCT 25.2 01/07/2009   MONOPCT 7 05/20/2019   MONOPCT 9.8 04/07/2014   MONOPCT 8.3 01/07/2009   EOSPCT 1 05/20/2019   EOSPCT 3.2 04/07/2014   EOSPCT 3.1 01/07/2009   BASOPCT 0 05/20/2019   BASOPCT 0.7 04/07/2014   BASOPCT 3.0 (H) 01/07/2009    CMP:  Lab Results  Component Value Date   NA 136 05/21/2019   NA 143 03/20/2017   NA 140 04/07/2014   NA 139 07/28/2008   K 3.2 (L) 05/21/2019   K 4.2 04/07/2014   K 3.8 07/28/2008   CL 104 05/21/2019   CL 111 (H) 04/07/2014   CL 104 07/28/2008   CO2 24 05/21/2019   CO2 27 04/07/2014   CO2 31 07/28/2008   BUN 12 05/21/2019   BUN 18 03/20/2017   BUN 10 04/07/2014   BUN 17 07/28/2008   CREATININE 0.98 05/21/2019   CREATININE 1.15 04/07/2014   CREATININE 1.3 (H) 07/28/2008   PROT 6.0 (L) 05/21/2019   PROT 5.9 (L) 04/07/2014   PROT 7.3 07/28/2008   ALBUMIN 3.1 (L) 05/21/2019   ALBUMIN 2.9 (L) 04/07/2014   BILITOT 0.8 05/21/2019   BILITOT 0.8 04/07/2014   BILITOT 0.60 07/28/2008   ALKPHOS 123 05/21/2019   ALKPHOS 81 04/07/2014   ALKPHOS 94 (H) 07/28/2008   AST  33 05/21/2019   AST 28 04/07/2014   AST 37 07/28/2008   ALT 27 05/21/2019   ALT 36 04/07/2014   ALT 31 07/28/2008  .   Total Time in preparing  paper work, data evaluation and todays exam - 35 minutes  Epifanio Lesches M.D on 05/22/2019 at 12:01 PM    Note: This dictation was prepared with Dragon dictation along with smaller phrase technology. Any transcriptional errors that result from this process are unintentional.

## 2019-09-06 IMAGING — CR ABDOMEN - 1 VIEW
2 series · 2 of 2 positions shown · non-contrast
Comparison: CT from 2 days ago

CLINICAL DATA: Small bowel obstruction

EXAM:
ABDOMEN - 1 VIEW

[abdomen kub (1 of 2)]
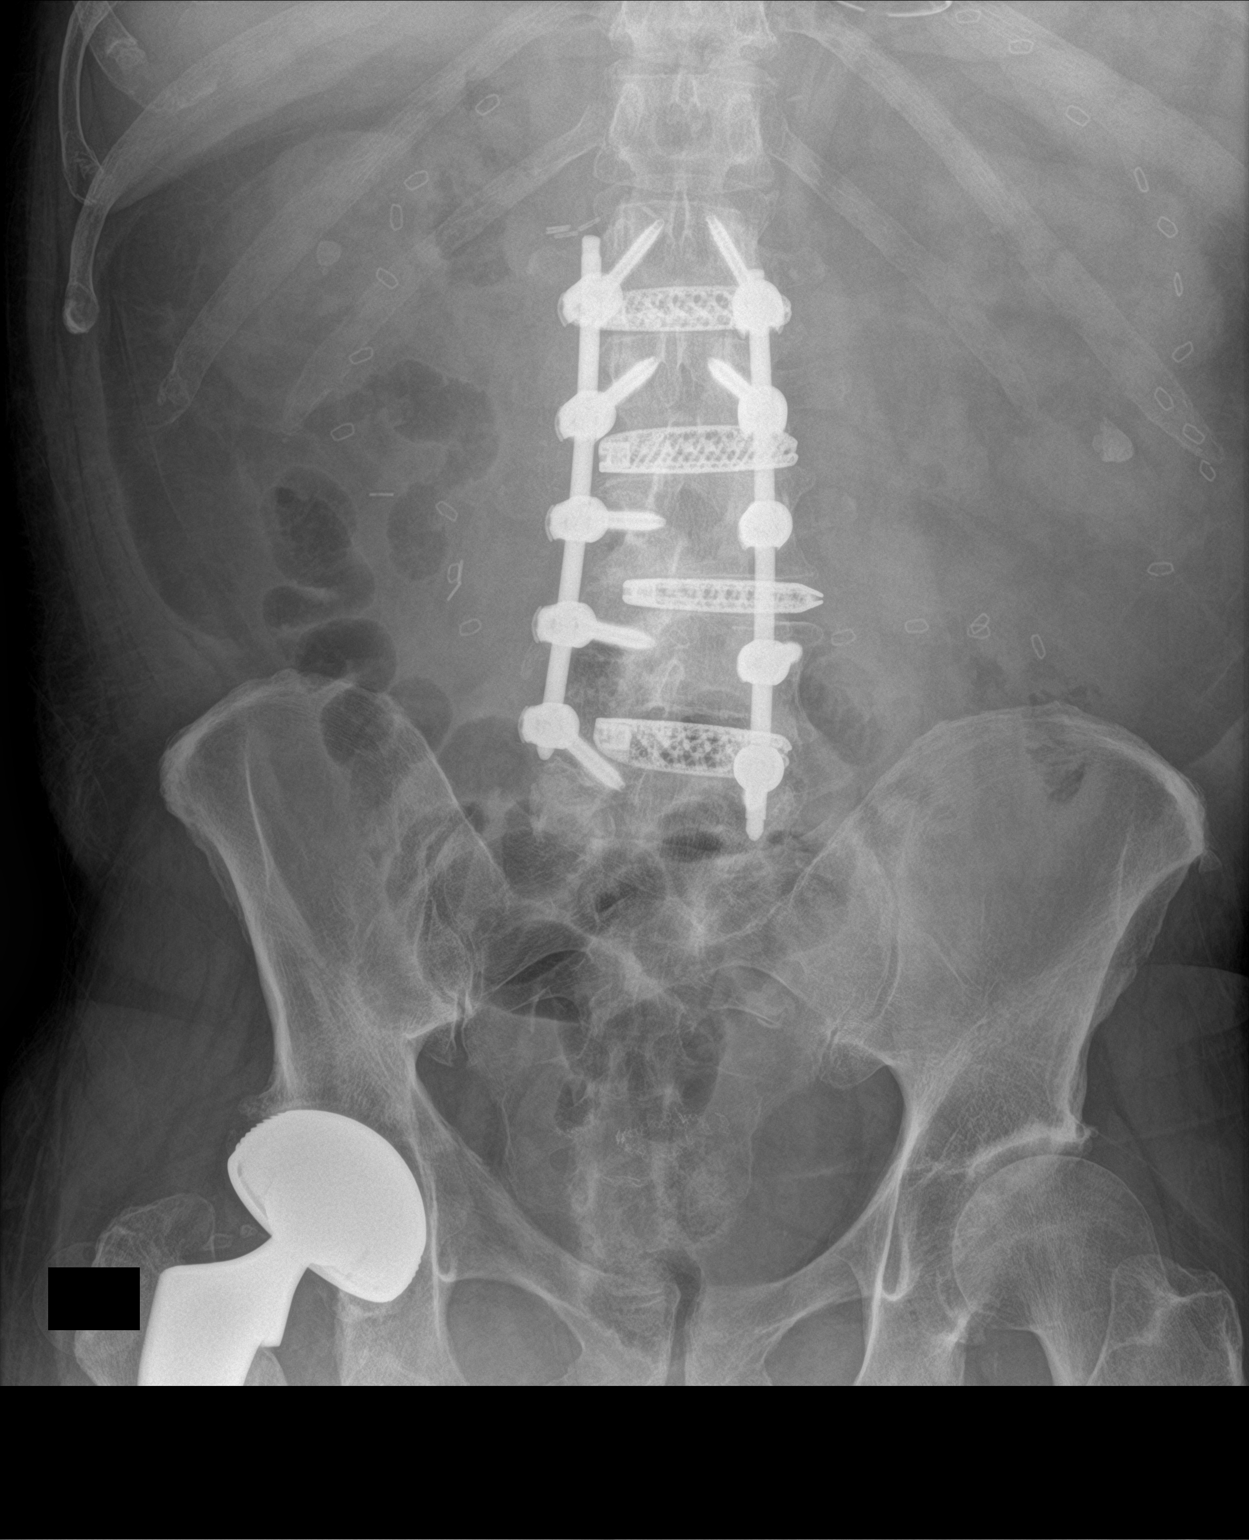

[abdomen kub (2 of 2)]
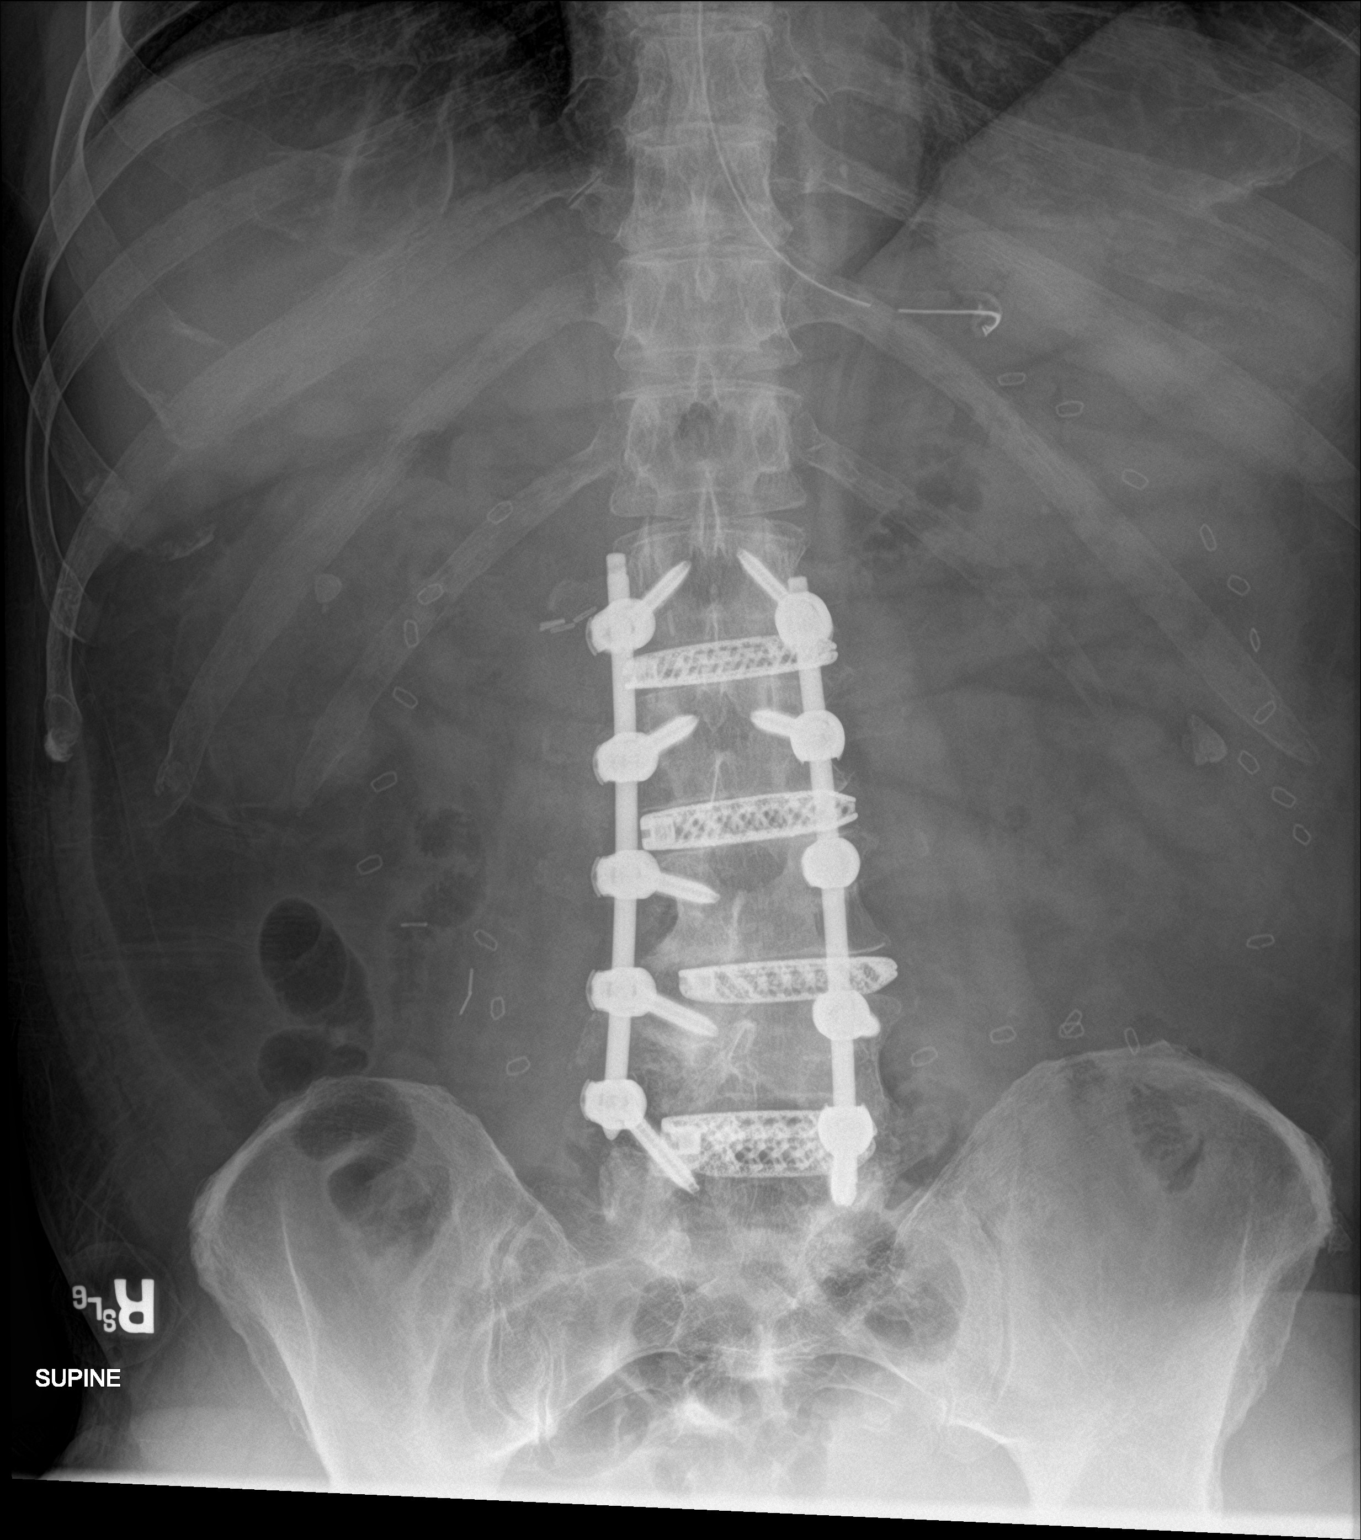

[2 of 2 positions shown; findings below may reference images not displayed]

FINDINGS: Nasogastric tube tip over the stomach. Nonobstructive bowel gas
pattern.

Bilateral nephrolithiasis. Surgical clips from prior midline hernia
repair with mesh. Multilevel lumbar spine fusion. Right hip
arthroplasty.
IMPRESSION: No detectable obstruction.

## 2019-09-14 IMAGING — CR ABDOMEN - 1 VIEW
1 series · 2 of 2 positions shown · non-contrast
Comparison: 04/16/2019

CLINICAL DATA: Flank pain

EXAM:
ABDOMEN - 1 VIEW

[Series 1: dg abd 1 view · 0.14mm/px · 2 of 2 slices shown]
[im 1/2]
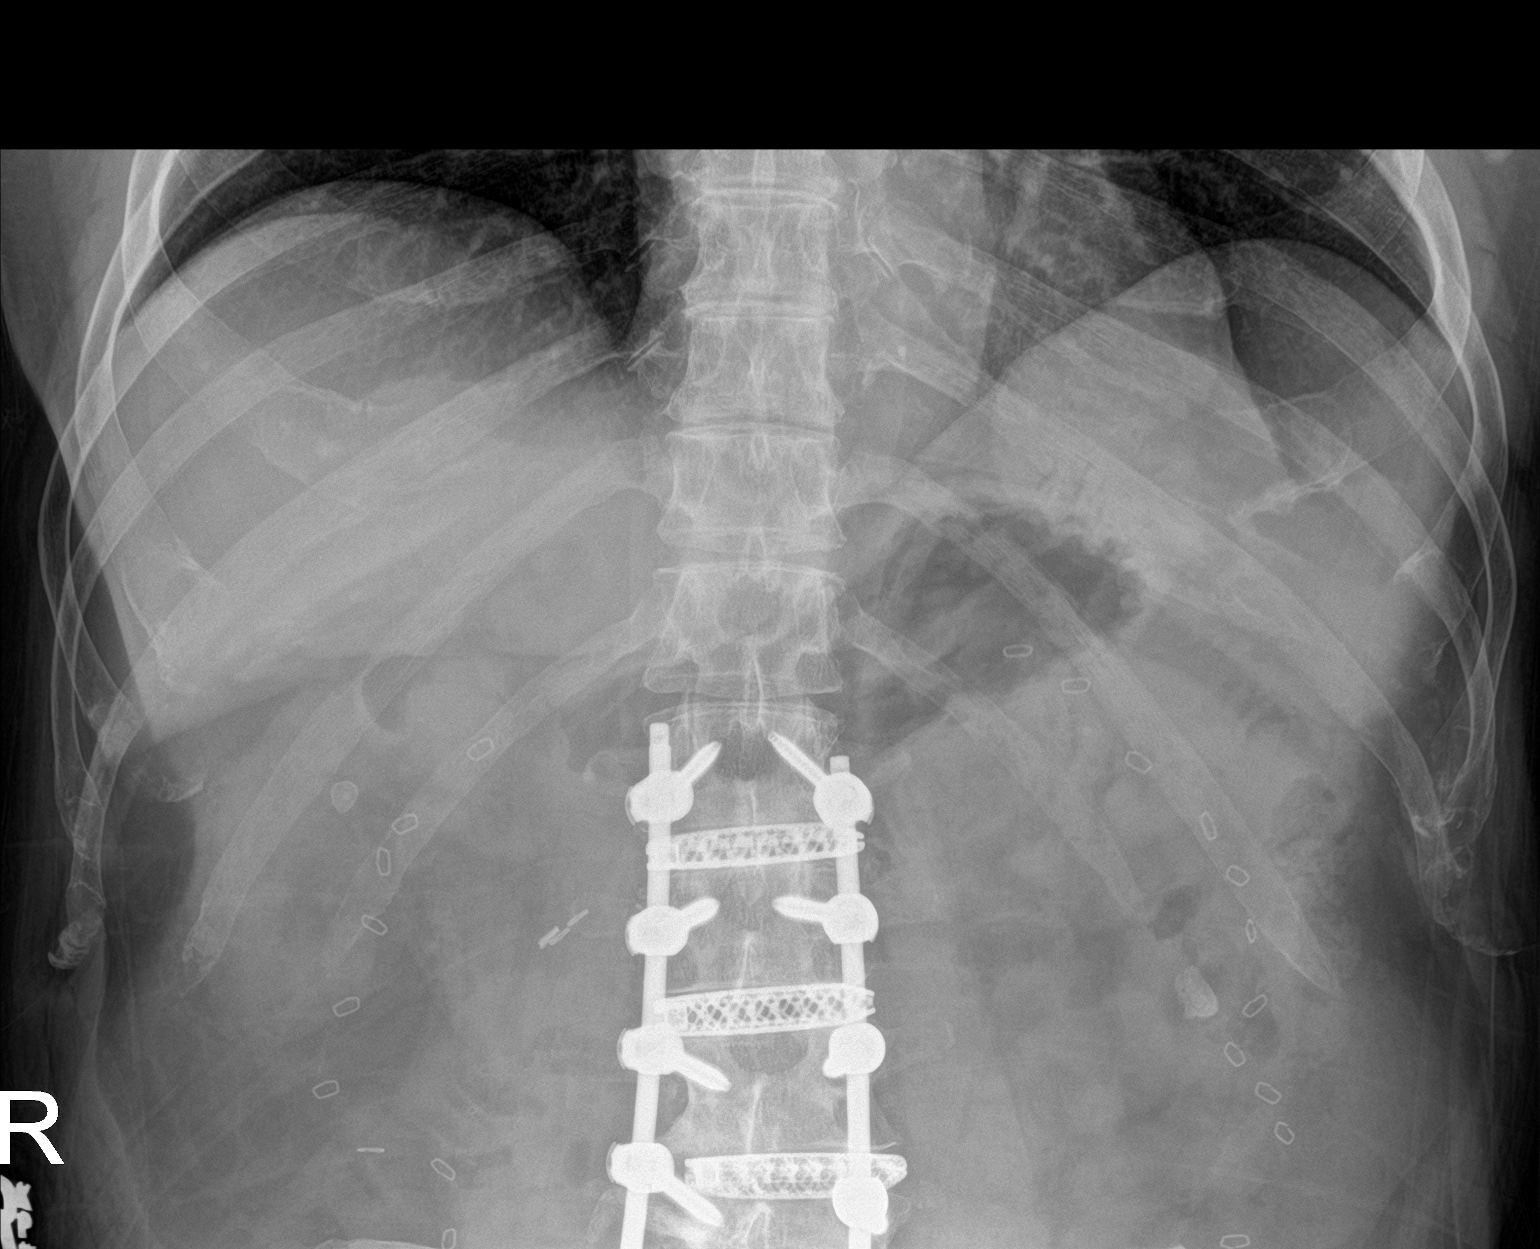
[im 2/2]
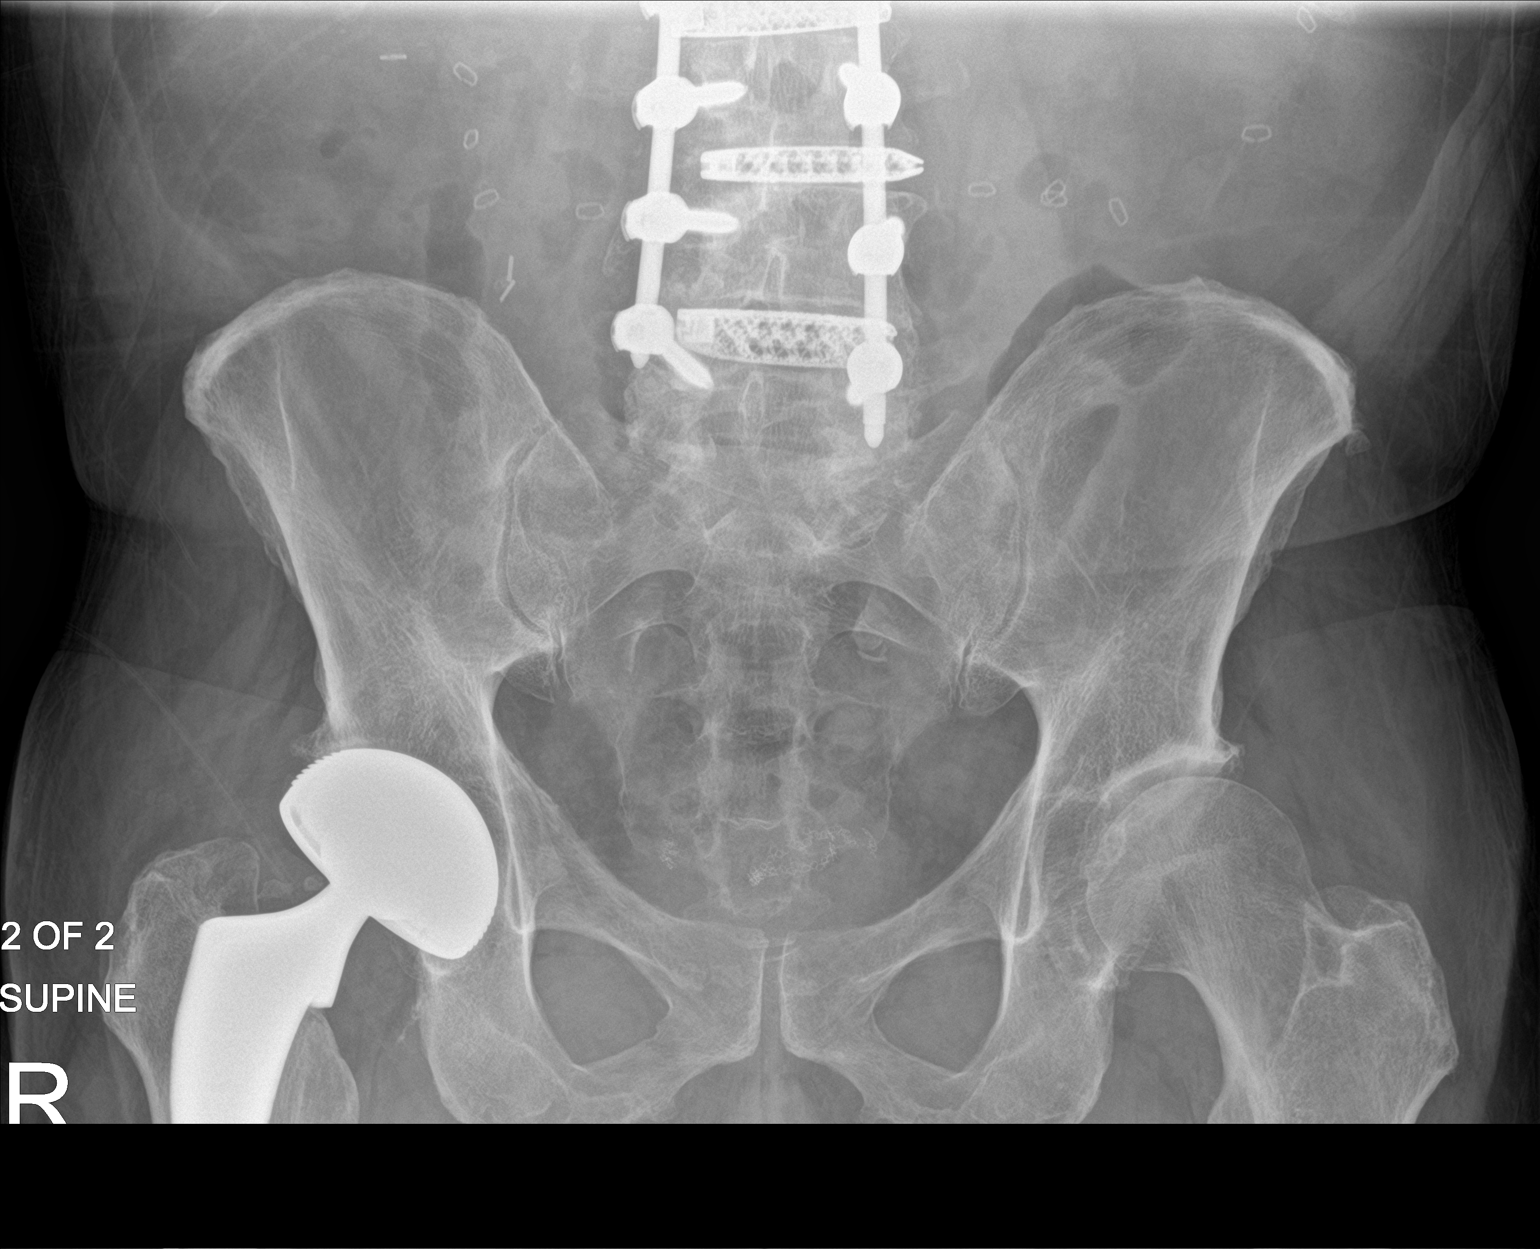

[2 of 2 positions shown; findings below may reference images not displayed]

FINDINGS: Scattered large and small bowel gas is noted. Bilateral
nonobstructing renal calculi are again seen. No ureteral stones are
noted. Postsurgical changes in the lumbar spine are seen as well as
in the anterior abdominal wall. No free air is noted. No acute bony
abnormality is seen.
IMPRESSION: Bilateral nonobstructing renal calculi

## 2019-10-10 IMAGING — DX DG CHEST 1V PORT
1 series · 1 of 1 positions shown · non-contrast
Comparison: 03/24/2019

CLINICAL DATA: Fever

EXAM:
PORTABLE CHEST 1 VIEW

[chest ap]
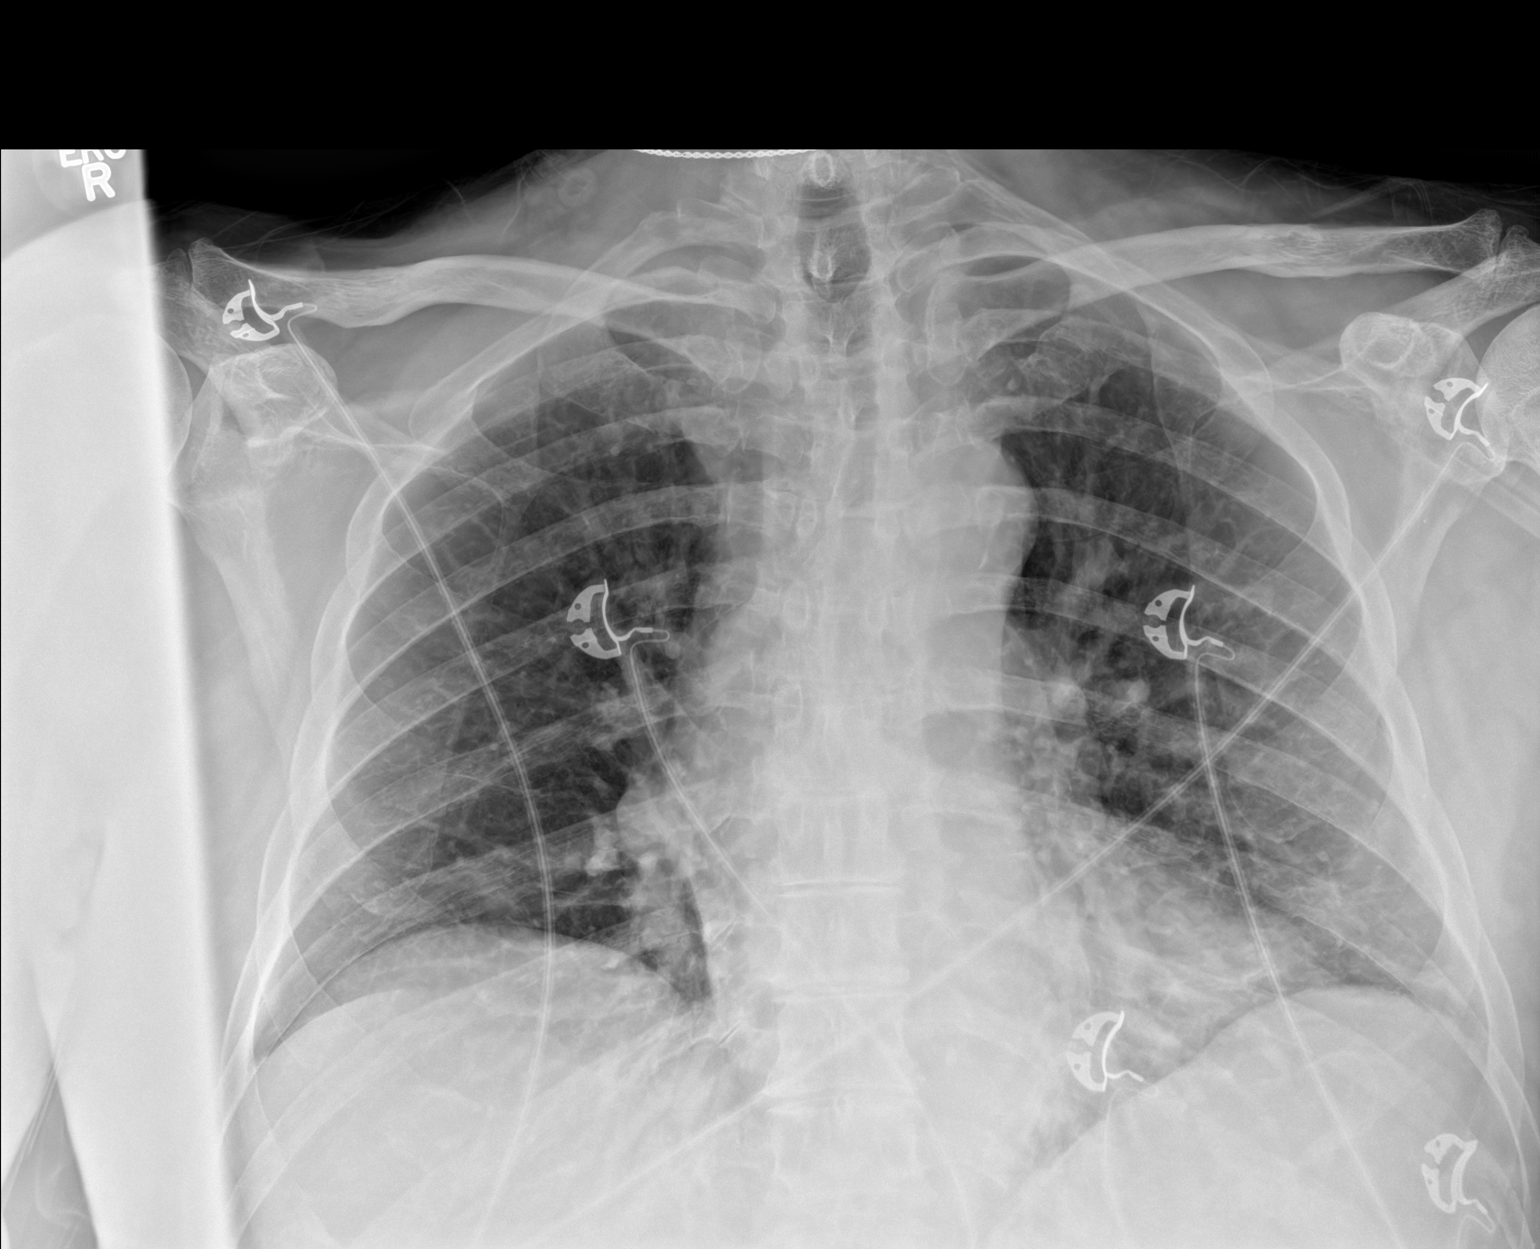

[1 of 1 positions shown; findings below may reference images not displayed]

FINDINGS: Normal heart size.  Mild aortic tortuosity.

Streaky density at the lung bases. No edema, effusion, or
pneumothorax.
IMPRESSION: Streaky opacity at the bases that could be atelectasis or
bronchopneumonia.

## 2019-10-25 ENCOUNTER — Ambulatory Visit (INDEPENDENT_AMBULATORY_CARE_PROVIDER_SITE_OTHER): Payer: Medicare Other | Admitting: Urology

## 2019-10-25 ENCOUNTER — Other Ambulatory Visit: Payer: Self-pay

## 2019-10-25 ENCOUNTER — Encounter: Payer: Self-pay | Admitting: Urology

## 2019-10-25 VITALS — BP 150/87 | HR 77 | Ht 70.0 in | Wt 175.0 lb

## 2019-10-25 DIAGNOSIS — R3129 Other microscopic hematuria: Secondary | ICD-10-CM

## 2019-10-25 DIAGNOSIS — N200 Calculus of kidney: Secondary | ICD-10-CM | POA: Diagnosis not present

## 2019-10-25 DIAGNOSIS — R350 Frequency of micturition: Secondary | ICD-10-CM | POA: Diagnosis not present

## 2019-10-25 DIAGNOSIS — N138 Other obstructive and reflux uropathy: Secondary | ICD-10-CM | POA: Diagnosis not present

## 2019-10-25 DIAGNOSIS — N401 Enlarged prostate with lower urinary tract symptoms: Secondary | ICD-10-CM

## 2019-10-25 LAB — MICROSCOPIC EXAMINATION

## 2019-10-25 LAB — URINALYSIS, COMPLETE
Bilirubin, UA: NEGATIVE
Glucose, UA: NEGATIVE
Ketones, UA: NEGATIVE
Leukocytes,UA: NEGATIVE
Nitrite, UA: NEGATIVE
Protein,UA: NEGATIVE
Specific Gravity, UA: 1.025 (ref 1.005–1.030)
Urobilinogen, Ur: 0.2 mg/dL (ref 0.2–1.0)
pH, UA: 6 (ref 5.0–7.5)

## 2019-10-25 LAB — BLADDER SCAN AMB NON-IMAGING: Scan Result: 0

## 2019-10-25 NOTE — Progress Notes (Signed)
10/25/2019 4:21 PM   [name] [birth year] [id number]  Referring provider: Jodi Marble, MD [place],  [place] [address]  Chief Complaint  Patient presents with  . Follow-up    HPI: [name] is a 79 year old male with a history of hematuria, nephrolithiasis, BPH with LU TS and urinary retention who presents today for follow up.    History of hematuria (high risk) Remote 20PY smoker.   Eval 2016 - CT Urogram - nephrolithiasis, cysto with very large prostate.  No reports of gross hematuria.  Cystoscopy with Dr. Tresa Moore in 09/2015 enlarged prostate.  He does not endorse and gross hematuria.  UA positive for 3-10 RBC's.    Nephrolithiasis Pre 2016 - medicla passage x several, URS x 1 08/2015 - medical passage left 28mm ureteral stone, LLP 1cm non-obstructing as well. No right sided stones. Composition Uric Acid. UpH 5.5--> KCit 20 BID  KUB noted increased in size of bilateral stones Cr 1.6 KUB on 03/22/2018 noted nephrolithiasis, grossly stable. If further investigation desired, consider CT urography. KUB 04/24/2019 revealed bilateral nonobstructing renal calculi He has no complaints of flank pain or passage of fragments.    BPH WITH LUTS  (prostate and/or bladder) IPSS score: 4/0   PVR: 0 mL   Previous score: 7/1  Previous PVR: 29 mL   Major complaint(s):  Weak stream and nocturia - baseline.   Denies any dysuria, hematuria or suprapubic pain.   Currently taking: tamsulosin 0.4 mg daily and finasteride 5 mg daily  Denies any recent fevers, chills, nausea or vomiting.   IPSS    Row Name 10/25/19 1000         International Prostate Symptom Score   How often have you had the sensation of not emptying your bladder?  Not at All     How often have you had to urinate less than every two hours?  Not at All     How often have you found you stopped and started again several times when you urinated?  Not at All     How often have you found it difficult to  postpone urination?  Not at All     How often have you had a weak urinary stream?  Less than half the time     How often have you had to strain to start urination?  Not at All     How many times did you typically get up at night to urinate?  2 Times     Total IPSS Score  4       Quality of Life due to urinary symptoms   If you were to spend the rest of your life with your urinary condition just the way it is now how would you feel about that?  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  Presented to the ED on 07/12 and had Foley place for > 800cc of urine.  He presented to the office on 07/22 for a TOV and was instructed in [facility].  Failed 1 st TOV on 07/22 and had Foley placed for > 1000cc.  Foley catheter was removed 04/09/2019.  He returned that afternoon for a bladder scan.  His PVR was 110 mL.  Today's PVR is 0 mL.     PMH: Past Medical History:  Diagnosis Date  . Arthritis   . Asthma   . Bowel  obstruction ([place]) [id number]  . Depression   . Diverticulitis 2006  . Enlarged prostate   . Gallstones 07/04/2014  . History of gastrointestinal ulcer   . History of kidney stones   . HLD (hyperlipidemia)   . HTN (hypertension)   . Kidney stone    kidney stones  . Neuritis or radiculitis due to rupture of lumbar intervertebral disc 05/29/2015  . Thyroid disease     Surgical History: Past Surgical History:  Procedure Laterality Date  . COLECTOMY  2006   Abdominal colectomy, ileostomy, Hartman procedure for perforation of cecum, sigmoid diverticulitis  . HERNIA REPAIR  2008?   [place]  . ILEOSTOMY  2007  . JOINT REPLACEMENT  2014   Hip Replacement [facility]ie Knows  . KNEE SURGERY Right 2011  . POSTERIOR LUMBAR FUSION 4 LEVEL N/A 03/13/2019   Procedure: POSTERIOR LUMBAR FUSION 4 LEVEL L1-5;  Surgeon: Meade Maw, MD;  Location: [facility] ORS;  Service: Neurosurgery;  Laterality: N/A;     Home Medications:  Allergies as of 10/25/2019      Reactions   Morphine And Related Other (See Comments)   Severe hallucinations   Other Other (See Comments)   Silk tape - Makes skin peel   Tape Other (See Comments)   Silk Tape per patient "peels off my skin"      Medication List       Accurate as of October 25, 2019 11:59 PM. If you have any questions, ask your nurse or doctor.        acetaminophen 325 MG tablet Commonly known as: TYLENOL Take 2 tablets (650 mg total) by mouth every 4 (four) hours as needed for mild pain ((score 1 to 3) or temp > 100.5).   atorvastatin 10 MG tablet Commonly known as: LIPITOR Take 10 mg by mouth daily.   celecoxib 200 MG capsule Commonly known as: CELEBREX Take 1 capsule (200 mg total) by mouth daily.   finasteride 5 MG tablet Commonly known as: Proscar Take 1 tablet (5 mg total) by mouth daily.   hydrochlorothiazide 25 MG tablet Commonly known as: HYDRODIURIL Take 25 mg by mouth daily.   levothyroxine 150 MCG tablet Commonly known as: SYNTHROID Take 150 mcg by mouth daily before breakfast.   loperamide 2 MG capsule Commonly known as: IMODIUM Take 4 mg by mouth 3 (three) times daily as needed for diarrhea or loose stools.   loratadine 10 MG tablet Commonly known as: CLARITIN Take 10 mg by mouth daily as needed for allergies.   montelukast 10 MG tablet Commonly known as: SINGULAIR Take 10 mg by mouth daily.   pantoprazole 40 MG tablet Commonly known as: PROTONIX Take 40 mg by mouth daily before breakfast.   potassium citrate 10 MEQ (1080 MG) SR tablet Commonly known as: UROCIT-K Take 20 mEq by mouth 2 (two) times daily.   sucralfate 1 g tablet Commonly known as: CARAFATE Take 1 g by mouth 2 (two) times a day.   tamsulosin 0.4 MG Caps capsule Commonly known as: FLOMAX TAKE 1 CAPSULE BY MOUTH ONCE DAILY   tiZANidine 4 MG tablet Commonly known as: ZANAFLEX Take 1 tablet (4 mg total) by mouth every 6 (six)  hours as needed. What changed: reasons to take this   vitamin B-12 500 MCG tablet Commonly known as: CYANOCOBALAMIN Take 500 mcg by mouth daily.       Allergies:  Allergies  Allergen Reactions  . Morphine And Related Other (See Comments)  Severe hallucinations  . Other Other (See Comments)    Silk tape - Makes skin peel  . Tape Other (See Comments)    Silk Tape per patient "peels off my skin"    Family History: Family History  Problem Relation Age of Onset  . Cancer Father        cancer of lung, brain  and lymphoma  . Parkinson's disease Brother   . Mesothelioma Brother   . Kidney disease Neg Hx   . Prostate cancer Neg Hx   . Kidney cancer Neg Hx   . Bladder Cancer Neg Hx     Social History:  reports that he quit smoking about 23 years ago. His smoking use included cigarettes. He has never used smokeless tobacco. He reports that he does not drink alcohol or use drugs.  ROS: For pertinent review of systems please refer to history of present illness  Physical Exam: BP (!) 150/87   Pulse 77   Ht 5\' 10"  (1.778 m)   Wt 175 lb (79.4 kg)   BMI 25.11 kg/m   Constitutional:  Well nourished. Alert and oriented, No acute distress. HEENT: [place] AT, mask in place.  Trachea midline, no masses. Cardiovascular: No clubbing, cyanosis, or edema. Respiratory: Normal respiratory effort, no increased work of breathing. GI: Abdomen is soft, non tender, non distended, no abdominal masses.  GU: No CVA tenderness.  No bladder fullness or masses.  Patient with circumcised phallus. Urethral meatus is patent.  No penile discharge. No penile lesions or rashes. Scrotum without lesions, cysts, rashes and/or edema.  Testicles are located scrotally bilaterally. No masses are appreciated in the testicles. Left and right epididymis are normal. Rectal: Patient with  normal sphincter tone. Anus and perineum without scarring or rashes. No rectal masses are appreciated. Prostate is approximately 45  grams, no nodules are appreciated. Seminal vesicles are normal. Skin: No rashes, bruises or suspicious lesions. Lymph: No inguinal adenopathy. Neurologic: Grossly intact, no focal deficits, moving all 4 extremities. Psychiatric: Normal mood and affect.  Laboratory Data: Urinalysis: Component     Latest Ref Rng & Units 10/25/2019  Specific Gravity, UA     1.005 - 1.030 1.025  pH, UA     5.0 - 7.5 6.0  Color, UA     Yellow Yellow  Appearance Ur     Clear Clear  Leukocytes,UA     Negative Negative  Protein,UA     Negative/Trace Negative  Glucose, UA     Negative Negative  Ketones, UA     Negative Negative  RBC, UA     Negative 1+ (A)  Bilirubin, UA     Negative Negative  Urobilinogen, Ur     0.2 - 1.0 mg/dL 0.2  Nitrite, UA     Negative Negative  Microscopic Examination      See below:   Component     Latest Ref Rng & Units 10/25/2019  WBC, UA     0 - 5 /hpf 0-5  RBC     0 - 2 /hpf 3-10 (A)  Epithelial Cells (non renal)     0 - 10 /hpf 0-10  Mucus, UA     Not Estab. Present (A)  Bacteria, UA     None seen/Few Few  I have reviewed the labs.    Pertinent Imaging Results for LIZZIE, DAGAN (MRN [place]:[id number]) as of 10/25/2019 10:12  Ref. Range 10/25/2019 10:04  Scan Result Unknown 0    Assessment & Plan:   1.  Microscopic hematuria -UA was not available when patient was in clinic and I contacted him at a later date -We discussed the findings of the microscopic hematuria and that his urine culture had returned negative.  I explained to him that microscopic hematuria can be a sign of either renal or bladder cancer and it is recommended that he undergo CT urogram and cystoscopy as he last work up for hematuria was several years ago.  I also explained it may be a result of his nephrolithiasis, but to be safe it would be appropriate to repeat the CT urogram and cystoscopy.  He is agreeable and wishes to proceed with the recommendations.  2. Bilateral  nephrolithiasis Stable - does not want a procedure at this time to address the stones KUB in one year Advised to contact our office or seek treatment in the ED if becomes febrile or pain/ vomiting are difficult control in order to arrange for emergent/urgent intervention  3. BPH with LU TS I PSS score is 4/0, it is improved  Continue conservative management, avoiding bladder irritants and timed voiding's Continue the tamsulosin 0.4 mg daily and finasteride 5 mg daily RTC in 6 months for I PSS and PVR    Return for CT Urogram report and cystoscopy.  These notes generated with voice recognition software. I apologize for typographical errors.  Zara Council, PA-C  [facility] [address] [place],  [address] [phone]

## 2019-10-26 ENCOUNTER — Other Ambulatory Visit: Payer: Self-pay | Admitting: Urology

## 2019-10-31 LAB — CULTURE, URINE COMPREHENSIVE

## 2019-11-06 ENCOUNTER — Other Ambulatory Visit: Payer: Self-pay | Admitting: Family Medicine

## 2019-11-06 DIAGNOSIS — R319 Hematuria, unspecified: Secondary | ICD-10-CM

## 2019-11-06 DIAGNOSIS — Z87448 Personal history of other diseases of urinary system: Secondary | ICD-10-CM

## 2019-12-02 ENCOUNTER — Ambulatory Visit
Admission: RE | Admit: 2019-12-02 | Discharge: 2019-12-02 | Disposition: A | Discharge status: Home / Self Care | Payer: Medicare Other | Source: Ambulatory Visit | Attending: Urology | Admitting: Urology

## 2019-12-02 ENCOUNTER — Other Ambulatory Visit: Payer: Self-pay

## 2019-12-02 DIAGNOSIS — R319 Hematuria, unspecified: Secondary | ICD-10-CM | POA: Insufficient documentation

## 2019-12-02 LAB — POCT I-STAT CREATININE: Creatinine, Ser: 1.2 mg/dL (ref 0.61–1.24)

## 2019-12-02 MED ORDER — IOHEXOL 300 MG/ML  SOLN
125.0000 mL | Freq: Once | INTRAMUSCULAR | Status: AC | PRN
  Administered 2019-12-02: 125 mL via INTRAVENOUS

## 2019-12-03 NOTE — Progress Notes (Signed)
12/04/19  CC:  Chief Complaint  Patient presents with  . Cysto    HPI: 79 y.o. male who returns today for a cysto.   -Remote 20 PY smoker  -Last cysto w/ Dr. Tresa Moore in 09/2015 showed enlarged prostate  -KUB 04/24/2019 revealed bilateral nonobstructing renal calculi -UA positive for 3-10 RBC on 10/25/19 -CT from 12/02/19 revealed bilateral nonobstructive renal calculi and single small bilateral kidney cysts   NED. A&Ox3.   No respiratory distress     Cystoscopy Procedure Note  Patient identification was confirmed, informed consent was obtained, and patient was prepped using Betadine solution.  Lidocaine jelly was administered per urethral meatus.     Pre-Procedure: - Inspection reveals a normal caliber urethral meatus.  Procedure: The flexible cystoscope was introduced without difficulty - No urethral strictures/lesions are present. -Prominent lateral lobe enlargement prostate - Moderately elevated bladder neck - Bilateral ureteral orifices identified - Bladder mucosa  reveals no ulcers, tumors, or lesions - No bladder stones - Moderate trabeculation with scatter diverticula   Retroflexion shows small intravesical medium lobe.   Post-Procedure: - Patient tolerated the procedure well  Pertinent Imagings:  CLINICAL DATA:  Microscopic hematuria.  EXAM: CT ABDOMEN AND PELVIS WITHOUT AND WITH CONTRAST  TECHNIQUE: Multidetector CT imaging of the abdomen and pelvis was performed following the standard protocol before and following the bolus administration of intravenous contrast.  CONTRAST:  175mL OMNIPAQUE IOHEXOL 300 MG/ML  SOLN  COMPARISON:  04/14/2019  FINDINGS: Lower chest: Unremarkable  Hepatobiliary: Subtle 9 mm low-density lesion in the dome of the liver (image 10/series 9) is stable since prior, likely benign. Gallbladder is contracted with tiny stones evident. No intrahepatic or extrahepatic biliary dilation.  Pancreas: No focal mass lesion.  No dilatation of the main duct. No intraparenchymal cyst. No peripancreatic edema.  Spleen: No splenomegaly. No focal mass lesion.  Adrenals/Urinary Tract: No adrenal nodule or mass.  Precontrast imaging shows a 5 x 5 mm nonobstructing interpolar right renal stone. No evidence for a right ureteral stone although the distal most aspect of the right ureter is obscured by beam hardening artifact from right hip replacement. 3 mm nonobstructing stone noted upper pole left kidney with 13 x 9 mm nonobstructing stone lower pole left kidney. No left ureteral stone evident.  Imaging after IV contrast administration shows a 1.6 cm cyst in the upper pole right kidney with numerous additional tiny hypoattenuating right renal lesions too small to characterize. 16 mm exophytic cyst noted lower pole left kidney, similar to prior with numerous tiny hypoenhancing left renal lesions too small to characterize.  Delayed post-contrast imaging shows no wall thickening or soft tissue filling defect in either intrarenal collecting system or renal pelvis. Neither ureter is completely opacified but there is no evidence for focal hydroureter or mass lesion along the length of either ureter. Delayed imaging through the bladder shows partial obscuration from beam hardening artifact related to right hip replacement. No gross bladder wall thickening evident.  Stomach/Bowel: Stomach is unremarkable. No gastric wall thickening. No evidence of outlet obstruction. Duodenum is normally positioned as is the ligament of Treitz. No small bowel wall thickening. No small bowel dilatation. Status post colectomy.  Vascular/Lymphatic: There is abdominal aortic atherosclerosis without aneurysm. There is no gastrohepatic or hepatoduodenal ligament lymphadenopathy. No retroperitoneal or mesenteric lymphadenopathy. 9 mm short axis distal left para-aortic node on 47/9 is stable in the interval No pelvic sidewall  lymphadenopathy.  Reproductive: Prostate gland largely obscured by artifact.  Other: No intraperitoneal free  fluid.  Musculoskeletal: Status post right hip replacement. Extensive lumbar fusion hardware evident. Ventral mesh noted.  IMPRESSION: 1. Bilateral nonobstructing renal stones. 2. Single small cysts are noted in each kidney with numerous tiny hypodensities in the kidneys bilaterally, too small to characterize. 3. Cholelithiasis. 4. Aortic Atherosclerosis (ICD10-I70.0).   Electronically Signed   By: Misty Stanley M.D.   On: 12/02/2019 10:48  I have personally reviewed the images and agree with radiologist interpretation.   Assessment/ Plan: No significant abnormalities identified on cystoscopy.  Potential sources of microhematuria discussed including prostate and renal calculi.  1. Nephrolithiasis   CT from 12/02/19 revealed bilateral nonobstructive renal calculi and single small bilateral kidney cysts   2. Microscopic hematuria  Secondary to above Asymptomatic  Cysto today unremarkable  Return in 1 year with KUB with Larene Beach PA-C   Abbie Sons, MD  I, Lucas Mallow, am acting as a scribe for Dr. Nicki Reaper C. Ferrel Simington,  I have reviewed the above documentation for accuracy and completeness, and I agree with the above.   Abbie Sons, MD

## 2019-12-04 ENCOUNTER — Ambulatory Visit (INDEPENDENT_AMBULATORY_CARE_PROVIDER_SITE_OTHER): Payer: Medicare Other | Admitting: Urology

## 2019-12-04 ENCOUNTER — Encounter: Payer: Self-pay | Admitting: Urology

## 2019-12-04 ENCOUNTER — Other Ambulatory Visit: Payer: Self-pay

## 2019-12-04 VITALS — BP 147/86 | HR 76 | Ht 70.0 in | Wt 180.0 lb

## 2019-12-04 DIAGNOSIS — R3129 Other microscopic hematuria: Secondary | ICD-10-CM

## 2019-12-04 NOTE — Patient Instructions (Signed)
Here today

## 2019-12-05 LAB — URINALYSIS, COMPLETE
Bilirubin, UA: NEGATIVE
Glucose, UA: NEGATIVE
Ketones, UA: NEGATIVE
Leukocytes,UA: NEGATIVE
Nitrite, UA: NEGATIVE
Protein,UA: NEGATIVE
RBC, UA: NEGATIVE
Specific Gravity, UA: 1.025 (ref 1.005–1.030)
Urobilinogen, Ur: 0.2 mg/dL (ref 0.2–1.0)
pH, UA: 6.5 (ref 5.0–7.5)

## 2019-12-05 LAB — MICROSCOPIC EXAMINATION

## 2019-12-25 ENCOUNTER — Other Ambulatory Visit: Payer: Self-pay | Admitting: Urology

## 2020-01-21 ENCOUNTER — Other Ambulatory Visit: Payer: Self-pay | Admitting: Urology

## 2020-01-21 DIAGNOSIS — N138 Other obstructive and reflux uropathy: Secondary | ICD-10-CM

## 2020-01-21 DIAGNOSIS — Z87898 Personal history of other specified conditions: Secondary | ICD-10-CM

## 2020-11-03 NOTE — Progress Notes (Signed)
11/04/2020 9:03 AM   [name] 03/07/1941 [id number]  Referring provider: Adin Hector, MD [place] [facility] [place],  [place] [address]  Chief Complaint  Patient presents with  . Follow-up   Urological history: 1. High risk hematuria - former smoker (21HY) - 2016 - CT Urogram - nephrolithiasis, cysto with very large prostate - Cystoscopy with Dr. Tresa Moore in 09/2015 enlarged prostate - 11/2019 - CT Urogram - Bilateral nonobstructing renal stones.  Single small cysts are noted in each kidney with numerous tiny hypodensities in the kidneys bilaterally, too small to characterize - cystoscopy 11/2019 - NED - UA in 08/2020 negative for micro heme  2. Nephrolithiasis - Pre 2016 - medicla passage x several, URS x 1 - 08/2015 - medical passage left 25m ureteral stone, LLP 1cm non-obstructing as well. No right sided stones - Composition Uric Acid  - managed with KCit 20 BID  - KUB 04/24/2019 revealed bilateral nonobstructing renal calculi  3. BPH with LU TS - PSA 1.66 in 08/2020 - I PSS 2/0 - managed with tamsulosin 0.4 mg daily and finasteride 5 mg daily   4. 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  Presented to the ED on 03/17/2019 and had Foley place for > 800cc of urine - He presented to the office on 03/27/2019 for a TOV and was instructed in [facility]IC.  Failed 1 st TOV on 07/22 and had Foley placed for > 1000cc.   - Foley catheter was removed 04/09/2019 - return scan 110 mL   HPI: Stephen SEQUEIRAis a 80y.o. male who presents today for a yearly follow up.       IPSS    Row Name 11/04/20 0800         International Prostate Symptom Score   How often have you had the sensation of not emptying your bladder? Not at All     How often have you had to urinate less than every two hours? Not at All     How often have you found you stopped and started again several times when you urinated? Not at All     How often have you found it  difficult to postpone urination? Less than 1 in 5 times     How often have you had a weak urinary stream? Not at All     How often have you had to strain to start urination? Not at All     How many times did you typically get up at night to urinate? 1 Time     Total IPSS Score 2           Quality of Life due to urinary symptoms   If you were to spend the rest of your life with your urinary condition just the way it is now how would you feel about that? Delighted            Score:  1-7 Mild 8-19 Moderate 20-35 Severe    PMH: Past Medical History:  Diagnosis Date  . Arthritis   . Asthma   . Bowel obstruction ([place] [id number] . Depression   . Diverticulitis 2006  . Enlarged prostate   . Gallstones 07/04/2014  . History of gastrointestinal ulcer   . History of kidney stones   . HLD (hyperlipidemia)   . HTN (hypertension)   . Kidney stone    kidney stones  . Neuritis or radiculitis due to rupture of lumbar intervertebral disc  05/29/2015  . Thyroid disease     Surgical History: Past Surgical History:  Procedure Laterality Date  . COLECTOMY  2006   Abdominal colectomy, ileostomy, Hartman procedure for perforation of cecum, sigmoid diverticulitis  . HERNIA REPAIR  2008?   [place]  . ILEOSTOMY  2007  . JOINT REPLACEMENT  2014   Hip Replacement [facility]  . KNEE SURGERY Right 2011  . POSTERIOR LUMBAR FUSION 4 LEVEL N/A 03/13/2019   Procedure: POSTERIOR LUMBAR FUSION 4 LEVEL L1-5;  Surgeon: Meade Maw, MD;  Location: [facility] ORS;  Service: Neurosurgery;  Laterality: N/A;    Home Medications:  Allergies as of 11/04/2020      Reactions   Morphine And Related Other (See Comments)   Severe hallucinations   Other Other (See Comments)   Silk tape - Makes skin peel   Tape Other (See Comments)   Silk Tape per patient "peels off my skin"      Medication List       Accurate as of November 04, 2020  9:03 AM. If you have any questions, ask your  nurse or doctor.        acetaminophen 325 MG tablet Commonly known as: TYLENOL Take 2 tablets (650 mg total) by mouth every 4 (four) hours as needed for mild pain ((score 1 to 3) or temp > 100.5).   atorvastatin 10 MG tablet Commonly known as: LIPITOR Take 10 mg by mouth daily.   celecoxib 200 MG capsule Commonly known as: CELEBREX Take 1 capsule (200 mg total) by mouth daily.   finasteride 5 MG tablet Commonly known as: PROSCAR TAKE ONE TABLET EVERY DAY   hydrochlorothiazide 25 MG tablet Commonly known as: HYDRODIURIL Take 25 mg by mouth daily.   levothyroxine 150 MCG tablet Commonly known as: SYNTHROID Take 150 mcg by mouth daily before breakfast.   loperamide 2 MG capsule Commonly known as: IMODIUM Take 4 mg by mouth 3 (three) times daily as needed for diarrhea or loose stools.   loratadine 10 MG tablet Commonly known as: CLARITIN Take 10 mg by mouth daily as needed for allergies.   losartan 25 MG tablet Commonly known as: COZAAR Take 25 mg by mouth daily.   montelukast 10 MG tablet Commonly known as: SINGULAIR Take 10 mg by mouth daily.   pantoprazole 40 MG tablet Commonly known as: PROTONIX Take 40 mg by mouth daily before breakfast.   potassium citrate 10 MEQ (1080 MG) SR tablet Commonly known as: UROCIT-K Take 20 mEq by mouth 2 (two) times daily.   sucralfate 1 g tablet Commonly known as: CARAFATE Take 1 g by mouth 2 (two) times a day.   tamsulosin 0.4 MG Caps capsule Commonly known as: FLOMAX TAKE 1 CAPSULE BY MOUTH ONCE DAILY   tiZANidine 4 MG tablet Commonly known as: ZANAFLEX Take 1 tablet (4 mg total) by mouth every 6 (six) hours as needed. What changed: reasons to take this   vitamin B-12 500 MCG tablet Commonly known as: CYANOCOBALAMIN Take 500 mcg by mouth daily.       Allergies:  Allergies  Allergen Reactions  . Morphine And Related Other (See Comments)    Severe hallucinations  . Other Other (See Comments)    Silk tape -  Makes skin peel  . Tape Other (See Comments)    Silk Tape per patient "peels off my skin"    Family History: Family History  Problem Relation Age of Onset  . Cancer Father  cancer of lung, brain  and lymphoma  . Parkinson's disease Brother   . Mesothelioma Brother   . Kidney disease Neg Hx   . Prostate cancer Neg Hx   . Kidney cancer Neg Hx   . Bladder Cancer Neg Hx     Social History:  reports that he quit smoking about 24 years ago. His smoking use included cigarettes. He has never used smokeless tobacco. He reports that he does not drink alcohol and does not use drugs.  ROS: For pertinent review of systems please refer to history of present illness  Physical Exam: BP 138/77   Pulse 76   Ht _0  (1.778 m)   Wt 181 lb (82.1 kg)   BMI 25.97 kg/m   Constitutional:  Well nourished. Alert and oriented, No acute distress. HEENT: [place] AT, mask in place.  Trachea midline Cardiovascular: No clubbing, cyanosis, or edema. Respiratory: Normal respiratory effort, no increased work of breathing. GU: No CVA tenderness.  No bladder fullness or masses.  Patient with circumcised phallus. Urethral meatus is patent.  No penile discharge. No penile lesions or rashes. Scrotum without lesions, cysts, rashes and/or edema.  Testicles are located scrotally bilaterally. No masses are appreciated in the testicles. Left and right epididymis are normal. Rectal: Patient with  normal sphincter tone. Anus and perineum without scarring or rashes. No rectal masses are appreciated. Prostate is approximately 45 grams, no nodules are appreciated. Seminal vesicles could not be palpated Lymph: No inguinal adenopathy. Neurologic: Grossly intact, no focal deficits, moving all 4 extremities. Psychiatric: Normal mood and affect.  Laboratory Data: Specimen:  Blood  Ref Range & Units 2 mo ago  Glucose 70 - 110 mg/dL 94   Sodium 136 - 145 mmol/L 143   Potassium 3.6 - 5.1 mmol/L 3.7   Chloride 97 - 109 mmol/L  107   Carbon Dioxide (CO2) 22.0 - 32.0 mmol/L 28.8   Urea Nitrogen (BUN) 7 - 25 mg/dL 16   Creatinine 0.7 - 1.3 mg/dL 1.1   Glomerular Filtration Rate (eGFR), MDRD Estimate >60 mL/min/1.73sq m 65   Calcium 8.7 - 10.3 mg/dL 9.4   AST  8 - 39 U/L 37   ALT  6 - 57 U/L 37   Alk Phos (alkaline Phosphatase) 34 - 104 U/L 83   Albumin 3.5 - 4.8 g/dL 4.0   Bilirubin, Total 0.3 - 1.2 mg/dL 1.0   Protein, Total 6.1 - 7.9 g/dL 6.2   A/G Ratio 1.0 - 5.0 gm/dL 1.8   Resulting Agency  [facility]  Specimen Collected: 08/10/20 9:50 AM Last Resulted: 08/10/20 1:40 PM  Received From: [place]  Result Received: 11/03/20 2:24 PM   Specimen:  Blood  Ref Range & Units 2 mo ago  WBC (White Blood Cell Count) 4.1 - 10.2 10^3/uL 4.3   RBC (Red Blood Cell Count) 4.69 - 6.13 10^6/uL 5.14   Hemoglobin 14.1 - 18.1 gm/dL 15.5   Hematocrit 40.0 - 52.0 % 45.7   MCV (Mean Corpuscular Volume) 80.0 - 100.0 fl 88.9   MCH (Mean Corpuscular Hemoglobin) 27.0 - 31.2 pg 30.2   MCHC (Mean Corpuscular Hemoglobin Concentration) 32.0 - 36.0 gm/dL 33.9   Platelet Count 150 - 450 10^3/uL 142Low   RDW-CV (Red Cell Distribution Width) 11.6 - 14.8 % 12.8   MPV (Mean Platelet Volume) 9.4 - 12.4 fl 10.6   Neutrophils 1.50 - 7.80 10^3/uL 2.37   Lymphocytes 1.00 - 3.60 10^3/uL 1.07   Monocytes 0.00 -  1.50 10^3/uL 0.53   Eosinophils 0.00 - 0.55 10^3/uL 0.31   Basophils 0.00 - 0.09 10^3/uL 0.05   Neutrophil % 32.0 - 70.0 % 54.6   Lymphocyte % 10.0 - 50.0 % 24.7   Monocyte % 4.0 - 13.0 % 12.2   Eosinophil % 1.0 - 5.0 % 7.1High   Basophil% 0.0 - 2.0 % 1.2   Immature Granulocyte % <=0.7 % 0.2   Immature Granulocyte Count <=0.06 10^3/L 0.01   Resulting Agency  [facility] - LAB  Specimen Collected: 08/10/20 9:50 AM Last Resulted: 08/10/20 10:27 AM  Received From: [place]  Result Received: 11/03/20 2:24 PM   Specimen:  Blood  Ref Range & Units 2 mo ago   Cholesterol, Total 100 - 200 mg/dL 127   Triglyceride 35 - 199 mg/dL 111   HDL (High Density Lipoprotein) Cholesterol 29.0 - 71.0 mg/dL 47.6   LDL Calculated 0 - 130 mg/dL 57   VLDL Cholesterol mg/dL 22   Cholesterol/HDL Ratio  2.7   Resulting Agency  [facility] - LAB  Specimen Collected: 08/10/20 9:50 AM Last Resulted: 08/10/20 1:40 PM  Received From: [place]  Result Received: 11/03/20 2:24 PM   Specimen:  Blood  Ref Range & Units 2 mo ago  Thyroid Stimulating Hormone (TSH) 0.450-5.330 uIU/ml uIU/mL 0.472   Resulting Agency  [facility] - LAB  Specimen Collected: 08/10/20 9:50 AM Last Resulted: 08/10/20 12:38 PM  Received From: [place]  Result Received: 11/03/20 2:24 PM   Specimen:  Blood  Ref Range & Units 2 mo ago  PSA (Prostate Specific Antigen), Total 0.10 - 4.00 ng/mL 1.66   Resulting Agency  [place] - LAB   Narrative Performed by [facility] - LAB Test results were determined with Beckman Coulter Hybritech Assay. Values obtained with different assay methods cannot be used interchangeably in serial testing. Assay results should not be interpreted as absolute evidence of the presence or absence of malignant disease Specimen Collected: 08/10/20 9:50 AM Last Resulted: 08/10/20 12:38 PM  Received From: [place]  Result Received: 11/03/20 2:24 PM   Specimen:  Urine  Ref Range & Units 2 mo ago  Color Yellow, Violet, Light Violet, Dark Violet Yellow   Clarity Clear Clear   Specific Gravity 1.000 - 1.030 1.025   pH, Urine 5.0 - 8.0 5.5   Protein, Urinalysis Negative, Trace mg/dL Negative   Glucose, Urinalysis Negative mg/dL Negative   Ketones, Urinalysis Negative mg/dL Negative   Blood, Urinalysis Negative TraceAbnormal   Nitrite, Urinalysis Negative Negative   Leukocyte Esterase, Urinalysis Negative Negative   White Blood Cells, Urinalysis None Seen, 0-3 /hpf 0-3    Red Blood Cells, Urinalysis None Seen, 0-3 /hpf 0-3   Bacteria, Urinalysis None Seen /hpf None Seen   Squamous Epithelial Cells, Urinalysis Rare, Few, None Seen /hpf None Seen   Resulting Agency  [facility] - LAB  Specimen Collected: 08/10/20 9:50 AM Last Resulted: 08/10/20 11:05 AM  Received From: [place]  Result Received: 11/03/20 2:24 PM   Specimen:  Blood  Ref Range & Units 2 mo ago  Hemoglobin A1C 4.2 - 5.6 % 5.3   Average Blood Glucose (Calc) mg/dL 105   Resulting Agency  [place] - LAB   Narrative Performed by [facility] - LAB Normal Range:  4.2 - 5.6%  Increased Risk: 5.7 - 6.4%  Diabetes:    >=  6.5%  Glycemic Control for adults with diabetes: <7%  Specimen Collected: 08/10/20 9:50 AM Last Resulted: 08/10/20 11:15 AM  Received From: [place]  Result Received: 11/03/20 2:24 PM   have reviewed the labs.    Assessment & Plan:   1. Bilateral nephrolithiasis - continue Urocit 10 mEq BID - asymptomatic at this time  2. BPH with LU TS - I PSS score is improved - Continue conservative management, avoiding bladder irritants and timed voiding's - Continue the tamsulosin 0.4 mg daily and finasteride 5 mg daily  3. High risk hematuria - work up x 2 - bilateral nephrolithiasis   Return in about 1 year (around 11/04/2021) for I PSS and exam .  These notes generated with voice recognition software. I apologize for typographical errors.  Zara Council, PA-C  [facility] [address] [place], [place] [address] [phone]

## 2020-11-04 ENCOUNTER — Other Ambulatory Visit: Payer: Self-pay

## 2020-11-04 ENCOUNTER — Ambulatory Visit (INDEPENDENT_AMBULATORY_CARE_PROVIDER_SITE_OTHER): Payer: Medicare Other | Admitting: Urology

## 2020-11-04 ENCOUNTER — Encounter: Payer: Self-pay | Admitting: Urology

## 2020-11-04 VITALS — BP 138/77 | HR 76 | Ht 70.0 in | Wt 181.0 lb

## 2020-11-04 DIAGNOSIS — N138 Other obstructive and reflux uropathy: Secondary | ICD-10-CM

## 2020-11-04 DIAGNOSIS — N200 Calculus of kidney: Secondary | ICD-10-CM | POA: Diagnosis not present

## 2020-11-04 DIAGNOSIS — N401 Enlarged prostate with lower urinary tract symptoms: Secondary | ICD-10-CM

## 2020-11-04 DIAGNOSIS — R319 Hematuria, unspecified: Secondary | ICD-10-CM

## 2021-01-12 ENCOUNTER — Other Ambulatory Visit: Payer: Self-pay | Admitting: Urology

## 2021-01-12 DIAGNOSIS — Z87898 Personal history of other specified conditions: Secondary | ICD-10-CM

## 2021-01-12 DIAGNOSIS — N138 Other obstructive and reflux uropathy: Secondary | ICD-10-CM

## 2021-04-27 ENCOUNTER — Other Ambulatory Visit: Payer: Self-pay | Admitting: Surgery

## 2021-04-30 ENCOUNTER — Encounter
Admission: RE | Admit: 2021-04-30 | Discharge: 2021-04-30 | Disposition: A | Discharge status: Home / Self Care | Payer: Medicare Other | Source: Ambulatory Visit | Attending: Surgery | Admitting: Surgery

## 2021-04-30 ENCOUNTER — Other Ambulatory Visit: Payer: Self-pay

## 2021-04-30 NOTE — Patient Instructions (Addendum)
Your procedure is scheduled on: 05/05/2021  Report to the Registration Desk on the 1st floor of the [facility]. To find out your arrival time, please call [phone] between 1PM - 3PM on: 05/04/2021   REMEMBER: Instructions that are not followed completely may result in serious medical risk, up to and including death; or upon the discretion of your surgeon and anesthesiologist your surgery may need to be rescheduled.  Do not eat food after midnight the night before surgery.  No gum chewing, lozengers or hard candies.  You may however, drink CLEAR liquids up to 2 hours before you are scheduled to arrive for your surgery. Do not drink anything within 2 hours of your scheduled arrival time.  Clear liquids include: - water  - apple juice without pulp - gatorade (not RED, PURPLE, OR BLUE) - black coffee or tea (Do NOT add milk or creamers to the coffee or tea) Do NOT drink anything that is not on this list.   In addition, your doctor has ordered for you to drink the provided  Pre-Surgery Clear Carbohydrate Drink  Drinking this carbohydrate drink up to two hours before surgery helps to reduce insulin resistance and improve patient outcomes. Please complete drinking 2 hours prior to scheduled arrival time. (Please note the time to stopped drinking)  TAKE THESE MEDICATIONS THE MORNING OF SURGERY WITH A SIP OF WATER:  Protonix (take one the night before and one on the morning of surgery - helps to prevent nausea after surgery.) 2. Lipitor  3. Proscar 4. Synthroid 5. Imodium 6. Loratadine 7.Singulair 8 Potassium citrate 9. Tamsulosin     One week prior to surgery: Stop Anti-inflammatories (NSAIDS) such as Advil, Aleve, Ibuprofen, Motrin, Naproxen, Naprosyn and Aspirin based products such as Excedrin, Goodys Powder, BC Powder and Celebrex  Stop ANY OVER THE COUNTER supplements until after surgery. You may however, continue to take Tylenol if needed for pain up until the day of  surgery.  No Alcohol for 24 hours before or after surgery.  No Smoking including e-cigarettes for 24 hours prior to surgery.  No chewable tobacco products for at least 6 hours prior to surgery.  No nicotine patches on the day of surgery.  Do not use any "recreational" drugs for at least a week prior to your surgery.  Please be advised that the combination of cocaine and anesthesia may have negative outcomes, up to and including death. If you test positive for cocaine, your surgery will be cancelled.  On the morning of surgery brush your teeth with toothpaste and water, you may rinse your mouth with mouthwash if you wish. Do not swallow any toothpaste or mouthwash.  Do not wear jewelry.   Do not wear lotions, powders, or perfumes.   Do not shave body from the neck down 48 hours prior to surgery just in case you cut yourself which could leave a site for infection.  Also, freshly shaved skin may become irritated if using the CHG soap.  Contact lenses, hearing aids and dentures may not be worn into surgery.  Do not bring valuables to the hospital. [facility] is not responsible for any missing/lost belongings or valuables.   Use CHG Soap or wipes as directed on instruction sheet.  Notify your doctor if there is any change in your medical condition (cold, fever, infection).   Wear comfortable clothing (specific to your surgery type) to the hospital.  After surgery, you can help prevent lung complications by doing breathing exercises.  Take deep  breaths and cough every 1-2 hours. Your doctor may order a device called an Incentive Spirometer to help you take deep breaths.  If you are being admitted to the hospital overnight, leave your suitcase in the car. After surgery it may be brought to your room.   If you are being discharged the day of surgery, you will not be allowed to drive home. You will need a responsible adult (18 years or older) to drive you home and stay with you that  night.   If you are taking public transportation, you will need to have a responsible adult (18 years or older) with you. Please confirm with your physician that it is acceptable to use public transportation.   Please call the [place] [facility] Dept. at [phone] if you have any questions about these instructions.  Surgery Visitation Policy:  Patients undergoing a surgery or procedure may have one family member or support person with them as long as that person is not COVID-19 positive or experiencing its symptoms.  That person may remain in the waiting area during the procedure.  Inpatient Visitation:    Visiting hours are 7 a.m. to 8 p.m. Inpatients will be allowed two visitors daily. The visitors may change each day during the patient's stay. No visitors under the age of 45. Any visitor under the age of 58 must be accompanied by an adult. The visitor must pass COVID-19 screenings, use hand sanitizer when entering and exiting the patient's room and wear a mask at all times, including in the patient's room. Patients must also wear a mask when staff or their visitor are in the room. Masking is required regardless of vaccination status.

## 2021-05-03 ENCOUNTER — Encounter: Payer: Self-pay | Admitting: Urgent Care

## 2021-05-03 ENCOUNTER — Other Ambulatory Visit: Payer: Self-pay

## 2021-05-03 ENCOUNTER — Encounter
Admission: RE | Admit: 2021-05-03 | Discharge: 2021-05-03 | Disposition: A | Discharge status: Home / Self Care | Payer: Medicare Other | Source: Ambulatory Visit | Attending: Surgery | Admitting: Surgery

## 2021-05-03 DIAGNOSIS — Z01818 Encounter for other preprocedural examination: Secondary | ICD-10-CM | POA: Diagnosis not present

## 2021-05-03 LAB — BASIC METABOLIC PANEL
Anion gap: 4 — ABNORMAL LOW (ref 5–15)
BUN: 16 mg/dL (ref 8–23)
CO2: 27 mmol/L (ref 22–32)
Calcium: 9.4 mg/dL (ref 8.9–10.3)
Chloride: 108 mmol/L (ref 98–111)
Creatinine, Ser: 1.02 mg/dL (ref 0.61–1.24)
GFR, Estimated: >60 mL/min (ref >60)
Glucose, Bld: 124 mg/dL — ABNORMAL HIGH (ref 70–99)
Potassium: 4 mmol/L (ref 3.5–5.1)
Sodium: 139 mmol/L (ref 135–145)

## 2021-05-03 LAB — PROTIME-INR
INR: 1 (ref 0.8–1.2)
Prothrombin Time: 13.2 seconds (ref 11.4–15.2)

## 2021-05-03 LAB — CBC
HCT: 43.2 % (ref 39.0–52.0)
Hemoglobin: 14.6 g/dL (ref 13.0–17.0)
MCH: 30.2 pg (ref 26.0–34.0)
MCHC: 33.8 g/dL (ref 30.0–36.0)
MCV: 89.4 fL (ref 80.0–100.0)
Platelets: 114 10*3/uL — ABNORMAL LOW (ref 150–400)
RBC: 4.83 MIL/uL (ref 4.22–5.81)
RDW: 13.8 % (ref 11.5–15.5)
WBC: 4.3 10*3/uL (ref 4.0–10.5)
nRBC: 0 % (ref 0.0–0.2)

## 2021-05-03 LAB — APTT: aPTT: 29 seconds (ref 24–36)

## 2021-05-04 MED ORDER — ORAL CARE MOUTH RINSE: 15.0000 mL | Freq: Once | OROMUCOSAL | Status: AC

## 2021-05-04 MED ORDER — CHLORHEXIDINE GLUCONATE 0.12 % MT SOLN: 15.0000 mL | Freq: Once | OROMUCOSAL | Status: AC

## 2021-05-04 MED ORDER — LACTATED RINGERS IV SOLN: INTRAVENOUS | Status: DC

## 2021-05-04 MED ORDER — CEFAZOLIN SODIUM-DEXTROSE 2-4 GM/100ML-% IV SOLN
2.0000 g | INTRAVENOUS | Status: AC
  Administered 2021-05-05: 2 g via INTRAVENOUS

## 2021-05-05 ENCOUNTER — Encounter
Admission: RE | Disposition: A | Discharge status: Home / Self Care | Payer: Self-pay | Source: Home / Self Care | Attending: Surgery

## 2021-05-05 ENCOUNTER — Encounter: Payer: Self-pay | Admitting: Surgery

## 2021-05-05 ENCOUNTER — Other Ambulatory Visit: Payer: Self-pay

## 2021-05-05 ENCOUNTER — Ambulatory Visit: Payer: Medicare Other | Admitting: Anesthesiology

## 2021-05-05 ENCOUNTER — Ambulatory Visit
Admission: RE | Admit: 2021-05-05 | Discharge: 2021-05-05 | Disposition: A | Discharge status: Home / Self Care | Payer: Medicare Other | Attending: Surgery | Admitting: Surgery

## 2021-05-05 DIAGNOSIS — Z888 Allergy status to other drugs, medicaments and biological substances status: Secondary | ICD-10-CM | POA: Diagnosis not present

## 2021-05-05 DIAGNOSIS — M65351 Trigger finger, right little finger: Secondary | ICD-10-CM | POA: Diagnosis not present

## 2021-05-05 DIAGNOSIS — Z87891 Personal history of nicotine dependence: Secondary | ICD-10-CM | POA: Diagnosis not present

## 2021-05-05 DIAGNOSIS — Z885 Allergy status to narcotic agent status: Secondary | ICD-10-CM | POA: Insufficient documentation

## 2021-05-05 DIAGNOSIS — Z79899 Other long term (current) drug therapy: Secondary | ICD-10-CM | POA: Diagnosis not present

## 2021-05-05 DIAGNOSIS — Z791 Long term (current) use of non-steroidal anti-inflammatories (NSAID): Secondary | ICD-10-CM | POA: Insufficient documentation

## 2021-05-05 DIAGNOSIS — Z79890 Hormone replacement therapy (postmenopausal): Secondary | ICD-10-CM | POA: Insufficient documentation

## 2021-05-05 HISTORY — PX: TRIGGER FINGER RELEASE: SHX641

## 2021-05-05 SURGERY — RELEASE, A1 PULLEY, FOR TRIGGER FINGER
Anesthesia: General | Site: Little Finger | Laterality: Right

## 2021-05-05 MED ORDER — CHLORHEXIDINE GLUCONATE 0.12 % MT SOLN
OROMUCOSAL | Status: AC
  Administered 2021-05-05: 15 mL via OROMUCOSAL
  Filled 2021-05-05: qty 15

## 2021-05-05 MED ORDER — 0.9 % SODIUM CHLORIDE (POUR BTL) OPTIME
TOPICAL | Status: DC | PRN
  Administered 2021-05-05: 500 mL

## 2021-05-05 MED ORDER — FENTANYL CITRATE (PF) 100 MCG/2ML IJ SOLN
INTRAMUSCULAR | Status: AC
  Filled 2021-05-05: qty 2

## 2021-05-05 MED ORDER — BUPIVACAINE HCL (PF) 0.5 % IJ SOLN
INTRAMUSCULAR | Status: AC
  Filled 2021-05-05: qty 30

## 2021-05-05 MED ORDER — LIDOCAINE HCL (CARDIAC) PF 100 MG/5ML IV SOSY
PREFILLED_SYRINGE | INTRAVENOUS | Status: DC | PRN
  Administered 2021-05-05: 60 mg via INTRAVENOUS

## 2021-05-05 MED ORDER — BUPIVACAINE HCL (PF) 0.5 % IJ SOLN
INTRAMUSCULAR | Status: DC | PRN
  Administered 2021-05-05: 5 mL

## 2021-05-05 MED ORDER — CEFAZOLIN SODIUM-DEXTROSE 2-4 GM/100ML-% IV SOLN
INTRAVENOUS | Status: AC
  Filled 2021-05-05: qty 100

## 2021-05-05 MED ORDER — PROPOFOL 10 MG/ML IV BOLUS
INTRAVENOUS | Status: DC | PRN
  Administered 2021-05-05: 150 mg via INTRAVENOUS
  Administered 2021-05-05: 20 mg via INTRAVENOUS

## 2021-05-05 MED ORDER — DEXAMETHASONE SODIUM PHOSPHATE 10 MG/ML IJ SOLN
INTRAMUSCULAR | Status: DC | PRN
  Administered 2021-05-05: 5 mg via INTRAVENOUS

## 2021-05-05 MED ORDER — FENTANYL CITRATE (PF) 100 MCG/2ML IJ SOLN
INTRAMUSCULAR | Status: DC | PRN
  Administered 2021-05-05: 50 ug via INTRAVENOUS

## 2021-05-05 MED ORDER — ONDANSETRON HCL 4 MG/2ML IJ SOLN
INTRAMUSCULAR | Status: DC | PRN
  Administered 2021-05-05: 4 mg via INTRAVENOUS

## 2021-05-05 SURGICAL SUPPLY — 28 items
APL PRP STRL LF DISP 70% ISPRP (MISCELLANEOUS) ×1
BNDG CMPR STD VLCR NS LF 5.8X2 (GAUZE/BANDAGES/DRESSINGS) ×1
BNDG COHESIVE 3X5 TAN STRL LF (GAUZE/BANDAGES/DRESSINGS) ×1 IMPLANT
BNDG ELASTIC 2X5.8 VLCR NS LF (GAUZE/BANDAGES/DRESSINGS) ×2 IMPLANT
BNDG ESMARK 4X12 TAN STRL LF (GAUZE/BANDAGES/DRESSINGS) ×2 IMPLANT
CHLORAPREP W/TINT 26 (MISCELLANEOUS) ×2 IMPLANT
CORD BIP STRL DISP 12FT (MISCELLANEOUS) ×2 IMPLANT
CUFF TOURN SGL QUICK 18X4 (TOURNIQUET CUFF) ×1 IMPLANT
DRAPE SURG 17X11 SM STRL (DRAPES) ×4 IMPLANT
FORCEPS JEWEL BIP 4-3/4 STR (INSTRUMENTS) ×2 IMPLANT
GAUZE 4X4 16PLY ~~LOC~~+RFID DBL (SPONGE) ×2 IMPLANT
GAUZE SPONGE 4X4 12PLY STRL (GAUZE/BANDAGES/DRESSINGS) ×2 IMPLANT
GAUZE XEROFORM 1X8 LF (GAUZE/BANDAGES/DRESSINGS) ×2 IMPLANT
GLOVE SURG ENC MOIS LTX SZ8 (GLOVE) ×4 IMPLANT
GLOVE SURG UNDER LTX SZ8 (GLOVE) ×2 IMPLANT
GOWN STRL REUS W/ TWL LRG LVL3 (GOWN DISPOSABLE) ×1 IMPLANT
GOWN STRL REUS W/ TWL XL LVL3 (GOWN DISPOSABLE) ×1 IMPLANT
GOWN STRL REUS W/TWL LRG LVL3 (GOWN DISPOSABLE) ×2
GOWN STRL REUS W/TWL XL LVL3 (GOWN DISPOSABLE) ×2
KIT TURNOVER KIT A (KITS) ×2 IMPLANT
MANIFOLD NEPTUNE II (INSTRUMENTS) ×2 IMPLANT
NDL HYPO 25X1 1.5 SAFETY (NEEDLE) ×1 IMPLANT
NEEDLE HYPO 25X1 1.5 SAFETY (NEEDLE) ×2 IMPLANT
NS IRRIG 500ML POUR BTL (IV SOLUTION) ×2 IMPLANT
PACK EXTREMITY ARMC (MISCELLANEOUS) ×2 IMPLANT
STOCKINETTE IMPERVIOUS 9X36 MD (GAUZE/BANDAGES/DRESSINGS) ×2 IMPLANT
SUT PROLENE 4 0 PS 2 18 (SUTURE) ×2 IMPLANT
WATER STERILE IRR 500ML POUR (IV SOLUTION) ×2 IMPLANT

## 2021-05-05 NOTE — Anesthesia Procedure Notes (Signed)
Procedure Name: LMA Insertion Date/Time: 05/05/2021 2:38 PM Performed by: Posey Pronto, Zalmen Wrightsman, CRNA Pre-anesthesia Checklist: Patient identified, Patient being monitored, Timeout performed, Emergency Drugs available and Suction available Patient Re-evaluated:Patient Re-evaluated prior to induction Oxygen Delivery Method: Circle system utilized Preoxygenation: Pre-oxygenation with 100% oxygen Induction Type: IV induction Ventilation: Mask ventilation without difficulty LMA: LMA inserted LMA Size: 5.0 Tube type: Oral Number of attempts: 2 Placement Confirmation: positive ETCO2 and breath sounds checked- equal and bilateral Tube secured with: Tape Dental Injury: Teeth and Oropharynx as per pre-operative assessment  Comments: Eyes taped prior to LMA placement. 1st attempt- LMA 4.

## 2021-05-05 NOTE — H&P (Signed)
History of Present Illness:  Stephen Cervantes is a 80 y.o. male who is being referred by Dr. Caryl Comes for evaluation and treatment of painful catching of his right little finger. The patient notes that the symptoms have been present for 2 to 3 months and developed without any specific cause or injury. His symptoms are aggravated by any repetitive grasping activities. He takes Celebrex on a daily basis for other issues, but does not find this to be particularly helpful for his hand. He has made a homemade splint to maintain extension at the PIP joint which he does find to be helpful. He is status post a prior right trigger thumb release from which she has done quite well. He denies any numbness or paresthesias to his fingers.  Current Outpatient Medications:  atorvastatin (LIPITOR) 10 MG tablet TAKE ONE TABLET EVERY DAY 90 tablet 1   celecoxib (CELEBREX) 200 MG capsule TAKE 1 CAPSULE BY MOUTH EVERY MORNING 90 capsule 1   cholestyramine-aspartame (CHOLESTYRAMINE LIGHT) 4 gram oral powder packet Take 1 packet (4 g total) by mouth Daily after breakfast Mix dose in 60-180 mL of water, milk or juice. 120 packet 5   finasteride (PROSCAR) 5 mg tablet Take 1 tablet by mouth once daily   hydroCHLOROthiazide (HYDRODIURIL) 25 MG tablet TAKE 1 TABLET BY MOUTH EVERY MORNING 90 tablet 1   levothyroxine (SYNTHROID) 200 MCG tablet TAKE 1 TABLET EVERY DAY ON EMPTY STOMACHWITH A GLASS OF WATER AT LEAST 30-60 MINBEFORE BREAKFAST 90 tablet 0   loperamide (IMODIUM A-D) 2 mg tablet Take 2 mg by mouth 3 (three) times daily as needed for Diarrhea   loratadine (CLARITIN) 10 mg tablet Take 10 mg by mouth once daily.   losartan (COZAAR) 25 MG tablet Take 1 tablet (25 mg total) by mouth once daily 30 tablet 11   montelukast (SINGULAIR) 10 mg tablet TAKE ONE TABLET EVERY MORNING 90 tablet 1   pantoprazole (PROTONIX) 40 MG DR tablet TAKE 1 TABLET BY MOUTH DAILY 90 tablet 1   potassium citrate (UROCIT-K) 10 mEq ER tablet TAKE ONE  TABLET (10 MEQ) BY MOUTH FOUR TIMES DAILY 360 tablet 0   sucralfate (CARAFATE) 1 gram tablet TAKE ONE TABLET BY MOUTH 3 TIMES DAILY BEFORE MEALS 90 tablet 0   tamsulosin (FLOMAX) 0.4 mg capsule Take 1 capsule by mouth once daily   Allergies:   Morphine Hallucination   Other Other (Silk tape - Makes skin peel)   Past Medical History:   Arthritis   Benign prostatic hyperplasia with urinary hesitancy 08/06/2019   History of duodenal ulcer 05/22/2017   Hyperlipidemia   Hypertension   Hypothyroidism   Kidney stones   Seasonal allergies   Thrombocytopenia 08/06/2019 (Prior oncology evaluation 2018   Past Surgical History:  Procedure Laterality Date   Colectomy   Hernia repair   JOINT REPLACEMENT   Kidney stone surgery   L1-5 lateral lumbar interbody fusion 03/13/2019  Dr Meade Maw at [facility]   Partial medial and lateral meniscectomy 01/01/2010   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   Family History:   Prostate cancer Father   High blood pressure (Hypertension) Father   Cancer Father   Kidney failure Mother   Alzheimer's disease Mother   Migraines Mother   Cancer Brother   Cancer Maternal Grandfather   Social History:   Socioeconomic History:   Marital status: Single  Tobacco Use   Smoking status: Former Smoker  Years: 20.00  Types: Cigarettes, Cigars   Smokeless tobacco: Never Used  Vaping Use   Vaping Use: Never used  Substance and Sexual Activity   Alcohol use: No  Alcohol/week: 0.0 standard drinks   Drug use: No  Social History Narrative  Education: na  Occupation: na  Hobbies: na  Marital Status: na   Review of Systems:  A comprehensive 14 point ROS was performed, reviewed, and the pertinent orthopaedic findings are documented in the HPI.  Physical Exam: Vitals:  04/23/21 1032  BP: 122/82  Weight: 81 kg (178 lb 9.6 oz)  Height: 175.3 cm ('5\' 9"'$ )  PainSc: 0-No pain  PainLoc: Finger   General/Constitutional: The patient appears to be  well-nourished, well-developed, and in no acute distress. Neuro/Psych: Normal mood and affect, oriented to person, place and time. Eyes: Non-icteric. Pupils are equal, round, and reactive to light, and exhibit synchronous movement. ENT: Unremarkable. Lymphatic: No palpable adenopathy. Respiratory: Lungs clear to auscultation, Normal chest excursion, No wheezes and Non-labored breathing Cardiovascular: Regular rate and rhythm. No murmurs. and No edema, swelling or tenderness, except as noted in detailed exam. Integumentary: No impressive skin lesions present, except as noted in detailed exam. Musculoskeletal: Unremarkable, except as noted in detailed exam.  Right hand exam: Skin inspection of the right hand is unremarkable. No swelling, erythema, ecchymosis, abrasions, or other skin abnormalities are identified. He has no tenderness to palpation along the volar or dorsal aspects of his right little finger, nor are there any other areas of tenderness around the hand or wrist. He exhibits full active and passive range of motion of all digits. However, there is reproducible triggering of the little finger as he extends his little finger from a flexed position, consistent with a right little trigger finger. He is neurovascularly intact to all digits.  X-rays/MRI/Lab data:  AP, lateral, and oblique views of the right hand are obtained. These films demonstrate no evidence for fractures, lytic lesions, or significant degenerative changes.  Assessment:  Trigger little finger of right hand   Plan: The treatment options were discussed with the patient. In addition, patient educational materials were provided regarding the diagnosis and treatment options. The patient is frustrated by his symptoms and function limitations, and is ready to consider more aggressive treatment options. He is offered but declines a steroid injection into the right little flexor sheath. Therefore, I have recommended a surgical  procedure, specifically a release of the right little trigger finger. The procedure was discussed with the patient, as were the potential risks (including bleeding, infection, nerve and/or blood vessel injury, persistent or recurrent pain, recurrent triggering, weakness of grip, need for further surgery, blood clots, strokes, heart attacks and/or arhythmias, pneumonia, etc.) and benefits. The patient states his understanding and wishes to proceed. All of the patient's questions and concerns were answered. He can call any time with further concerns. He will follow up post-surgery, routine.   H&P reviewed and patient re-examined. No changes.

## 2021-05-05 NOTE — Op Note (Signed)
05/05/2021  3:12 PM  Patient:   Stephen Cervantes  Pre-Op Diagnosis:   Right little trigger finger.  Post-Op Diagnosis:   Same  Procedure:   Release right little trigger finger.  Surgeon:   Pascal Lux, MD  Assistant:   None  Anesthesia:   General LMA  Findings:   As above.  Complications:   None  EBL:   0 cc  Fluids:   600 cc crystalloid  TT:   13 minutes at 250 mmHg  Drains:   None  Closure:   4-0 Prolene  Implants:   None  Brief Clinical Note:   The patient is an 80 year old male with a history of painful catching of his right little finger. These symptoms have progressed despite medications, activity modification, splinting, etc. The patient's history and examination were consistent with a right little trigger finger. The patient presents at this time for a right little trigger finger release.  Procedure:   The patient was brought into the operating room and lain in the supine position. After adequate general laryngeal mask anesthesia was achieved, the right hand and upper extremity were prepped with ChloraPrep solution before being draped sterilely. Preoperative antibiotics were administered. A timeout was performed to verify the appropriate surgical site. The right upper extremity was prepped with ChloraPrep solution before being draped sterilely. Preoperative antibiotics were administered.  Utilizing the distal palmar crease, an approximately 1.5-2.0 cm incision was made over the volar aspect of the right little at the level of the metacarpal head centered over the flexor sheath. The incision was carried down through the subcutaneous tissues with care taken to identify and protect the digital neurovascular structures. The flexor sheath was entered just proximal to the A1 pulley. The sheath was released proximally for several centimeters under direct visualization. Distally, a clamp was placed beneath the A1 pulley and used to release any adhesions. The clamp was  repositioned so that one jaw was superficial to and the other jaw deep to the A1 pulley. The A1 pulley was incised on either side of the clamp to remove a 2 mm strip of tissue. Metzenbaum scissors were used to ensure complete release of the A1 pulley more distally. The underlying tendons were carefully inspected and found to be intact.   The wound was copiously irrigated with sterile saline solution before the wound was closed using 4-0 Prolene interrupted sutures. A total of 5 cc of 0.5% plain Sensorcaine was injected in and around the incision to help with postoperative analgesia before a sterile bulky dressing was applied to the hand. The patient was then awakened, extubated, and returned to the recovery room in satisfactory condition after tolerating the procedure well.

## 2021-05-05 NOTE — Transfer of Care (Signed)
Immediate Anesthesia Transfer of Care Note  Patient: Stephen Cervantes  Procedure(s) Performed: RELEASE TRIGGER FINGER/A-1 PULLEY (Right: Little Finger)  Patient Location: PACU  Anesthesia Type:General  Level of Consciousness: awake, drowsy and patient cooperative  Airway & Oxygen Therapy: Patient Spontanous Breathing and Patient connected to face mask oxygen  Post-op Assessment: Report given to RN and Post -op Vital signs reviewed and stable  Post vital signs: Reviewed and stable  Last Vitals:  Vitals Value Taken Time  BP 134/65 05/05/21 1515  Temp    Pulse 66 05/05/21 1517  Resp 15 05/05/21 1517  SpO2 100 % 05/05/21 1517  Vitals shown include unvalidated device data.  Last Pain:  Vitals:   05/05/21 1306  TempSrc: Temporal  PainSc: 0-No pain         Complications: No notable events documented.

## 2021-05-05 NOTE — Discharge Instructions (Addendum)
Orthopedic discharge instructions: Keep dressing dry and intact. Keep hand elevated above heart level. May shower after dressing removed on postop day 4 (Sunday). Cover sutures with Band-Aids after drying off. Apply ice to affected area frequently. Take Celebrex 200 mg daily OR ibuprofen 600-800 mg TID with meals for 5-7 days, then as necessary. Take ES Tylenol or pain medication as prescribed when needed.  Return for follow-up in 10-14 days or as scheduled.AMBULATORY SURGERY  DISCHARGE INSTRUCTIONS   The drugs that you were given will stay in your system until tomorrow so for the next 24 hours you should not:  Drive an automobile Make any legal decisions Drink any alcoholic beverage   You may resume regular meals tomorrow.  Today it is better to start with liquids and gradually work up to solid foods.  You may eat anything you prefer, but it is better to start with liquids, then soup and crackers, and gradually work up to solid foods.   Please notify your doctor immediately if you have any unusual bleeding, trouble breathing, redness and pain at the surgery site, drainage, fever, or pain not relieved by medication.    Additional Instructions:   Please contact your physician with any problems or Same Day Surgery at [phone], Monday through Friday 6 am to 4 pm, or [place] at [facility] number at [phone].

## 2021-05-05 NOTE — Anesthesia Preprocedure Evaluation (Signed)
Anesthesia Evaluation  Patient identified by MRN, date of birth, ID band Patient awake    Reviewed: Allergy & Precautions, H&P , NPO status , Patient's Chart, lab work & pertinent test results, reviewed documented beta blocker date and time   Airway Mallampati: II  TM Distance: >3 FB Neck ROM: full    Dental  (+) Teeth Intact   Pulmonary asthma , pneumonia, resolved, former smoker,    Pulmonary exam normal        Cardiovascular Exercise Tolerance: Good hypertension, On Medications negative cardio ROS Normal cardiovascular exam Rate:Normal     Neuro/Psych PSYCHIATRIC DISORDERS Depression negative neurological ROS     GI/Hepatic negative GI ROS, Neg liver ROS,   Endo/Other  negative endocrine ROS  Renal/GU Renal disease  negative genitourinary   Musculoskeletal   Abdominal   Peds  Hematology negative hematology ROS (+)   Anesthesia Other Findings   Reproductive/Obstetrics negative OB ROS                             Anesthesia Physical Anesthesia Plan  ASA: 3  Anesthesia Plan: General LMA   Post-op Pain Management:    Induction:   PONV Risk Score and Plan: 3  Airway Management Planned:   Additional Equipment:   Intra-op Plan:   Post-operative Plan:   Informed Consent: I have reviewed the patients History and Physical, chart, labs and discussed the procedure including the risks, benefits and alternatives for the proposed anesthesia with the patient or authorized representative who has indicated his/her understanding and acceptance.       Plan Discussed with: CRNA  Anesthesia Plan Comments:         Anesthesia Quick Evaluation

## 2021-05-06 NOTE — Anesthesia Postprocedure Evaluation (Signed)
Anesthesia Post Note  Patient: Stephen Cervantes  Procedure(s) Performed: RELEASE TRIGGER FINGER/A-1 PULLEY (Right: Little Finger)  Patient location during evaluation: PACU Anesthesia Type: General Level of consciousness: awake and alert Pain management: pain level controlled Vital Signs Assessment: post-procedure vital signs reviewed and stable Respiratory status: spontaneous breathing, nonlabored ventilation, respiratory function stable and patient connected to nasal cannula oxygen Cardiovascular status: blood pressure returned to baseline and stable Postop Assessment: no apparent nausea or vomiting Anesthetic complications: no   No notable events documented.   Last Vitals:  Vitals:   05/05/21 1545 05/05/21 1559  BP: 120/74 132/68  Pulse: 65 77  Resp: 15 16  Temp:  (!) 36.2 C  SpO2: 99% 100%    Last Pain:  Vitals:   05/05/21 1559  TempSrc: Temporal  PainSc: 0-No pain                 Molli Barrows

## 2021-11-03 NOTE — Progress Notes (Signed)
11/04/2021 3:11 PM   [name] [birth year] [id number]  Referring provider: Adin Hector, MD [place] [facility] [place],  [place] [address]  Chief Complaint  Patient presents with   Hematuria   Benign Prostatic Hypertrophy   Nephrolithiasis   Urological history: 1. High risk hematuria - former smoker (72ZD) - 2016 - CT Urogram - nephrolithiasis, cysto with very large prostate - Cystoscopy with Dr. Tresa Moore in 09/2015 enlarged prostate - 11/2019 - CT Urogram - Bilateral nonobstructing renal stones.  Single small cysts are noted in each kidney with numerous tiny hypodensities in the kidneys bilaterally, too small to characterize - cystoscopy 11/2019 - NED - no reports of gross heme - UA negative for micro heme  2. Nephrolithiasis - Pre 2016 - medicla passage x several, URS x 1 - 08/2015 - medical passage left 46mm ureteral stone, LLP 1cm non-obstructing as well. No right sided stones - Composition Uric Acid  - managed with KCit 20 BID  - KUB 04/24/2019 revealed bilateral nonobstructing renal calculi  3. BPH with LU TS - PSA 1.66 in 08/2020 - I PSS 0/0  - managed with tamsulosin 0.4 mg daily and finasteride 5 mg daily   4. 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  Presented to the ED on 03/17/2019 and had Foley place for > 800cc of urine - He presented to the office on 03/27/2019 for a TOV and was instructed in CIC.  Failed 1 st TOV on 07/22 and had Foley placed for > 1000cc.   - Foley catheter was removed 04/09/2019 - return scan 110 mL   HPI: Stephen Cervantes is a 81 y.o. male who presents today for a yearly follow up.   No urinary complaints.  Patient denies any modifying or aggravating factors.  Patient denies any gross hematuria, dysuria or suprapubic/flank pain.  Patient denies any fevers, chills, nausea or vomiting.    UA is negative today.     IPSS     Row Name 11/04/21 1000         International Prostate Symptom  Score   How often have you had the sensation of not emptying your bladder? Not at All     How often have you had to urinate less than every two hours? Not at All     How often have you found you stopped and started again several times when you urinated? Not at All     How often have you found it difficult to postpone urination? Not at All     How often have you had a weak urinary stream? Not at All     How often have you had to strain to start urination? Not at All     How many times did you typically get up at night to urinate? None     Total IPSS Score 0       Quality of Life due to urinary symptoms   If you were to spend the rest of your life with your urinary condition just the way it is now how would you feel about that? Delighted               Score:  1-7 Mild 8-19 Moderate 20-35 Severe    PMH: Past Medical History:  Diagnosis Date   Arthritis    Asthma    Bowel obstruction ([place]) [id number]   Depression    Diverticulitis 2006   Enlarged prostate    Gallstones  07/04/2014   History of gastrointestinal ulcer    History of kidney stones    HLD (hyperlipidemia)    HTN (hypertension)    Kidney stone    kidney stones   Neuritis or radiculitis due to rupture of lumbar intervertebral disc 05/29/2015   Thyroid disease     Surgical History: Past Surgical History:  Procedure Laterality Date   COLECTOMY  2006   Abdominal colectomy, ileostomy, Hartman procedure for perforation of cecum, sigmoid diverticulitis   HERNIA REPAIR  2008?   [facility]   ILEOSTOMY  2007   JOINT REPLACEMENT  2014   Hip Replacement Hessie Knows   KNEE SURGERY Right 2011   POSTERIOR LUMBAR FUSION 4 LEVEL N/A 03/13/2019   Procedure: POSTERIOR LUMBAR FUSION 4 LEVEL L1-5;  Surgeon: Meade Maw, MD;  Location: [facility] ORS;  Service: Neurosurgery;  Laterality: N/A;   TRIGGER FINGER RELEASE Right 05/05/2021   Procedure: RELEASE TRIGGER FINGER/A-1 PULLEY;  Surgeon: Corky Mull, MD;  Location: [facility] ORS;  Service: Orthopedics;  Laterality: Right;    Home Medications:  Allergies as of 11/04/2021       Reactions   Morphine And Related Other (See Comments)   Severe hallucinations   Tape Other (See Comments)   Silk Tape per patient "peels off my skin"        Medication List        Accurate as of November 04, 2021 11:59 PM. If you have any questions, ask your nurse or doctor.          acetaminophen 325 MG tablet Commonly known as: TYLENOL Take 2 tablets (650 mg total) by mouth every 4 (four) hours as needed for mild pain ((score 1 to 3) or temp > 100.5).   atorvastatin 10 MG tablet Commonly known as: LIPITOR Take 10 mg by mouth daily.   celecoxib 200 MG capsule Commonly known as: CELEBREX Take 1 capsule (200 mg total) by mouth daily.   ferrous sulfate 325 (65 FE) MG tablet Take 325 mg by mouth daily with breakfast.   finasteride 5 MG tablet Commonly known as: PROSCAR TAKE ONE TABLET EVERY DAY   hydrochlorothiazide 25 MG tablet Commonly known as: HYDRODIURIL Take 25 mg by mouth daily.   levothyroxine 200 MCG tablet Commonly known as: SYNTHROID Take 200 mcg by mouth daily before breakfast.   loperamide 2 MG capsule Commonly known as: IMODIUM Take 4 mg by mouth in the morning, at noon, and at bedtime.   loratadine 10 MG tablet Commonly known as: CLARITIN Take 10 mg by mouth daily.   losartan 25 MG tablet Commonly known as: COZAAR Take 25 mg by mouth daily.   montelukast 10 MG tablet Commonly known as: SINGULAIR Take 10 mg by mouth daily.   pantoprazole 40 MG tablet Commonly known as: PROTONIX Take 40 mg by mouth daily before breakfast.   potassium citrate 10 MEQ (1080 MG) SR tablet Commonly known as: UROCIT-K Take 20 mEq by mouth 2 (two) times daily.   sucralfate 1 g tablet Commonly known as: CARAFATE Take 1 g by mouth in the morning, at noon, and at bedtime.   tamsulosin 0.4 MG Caps capsule Commonly known as: FLOMAX TAKE  1 CAPSULE BY MOUTH ONCE DAILY        Allergies:  Allergies  Allergen Reactions   Morphine And Related Other (See Comments)    Severe hallucinations   Tape Other (See Comments)    Silk Tape per patient "peels off my  skin"    Family History: Family History  Problem Relation Age of Onset   Cancer Father        cancer of lung, brain  and lymphoma   Parkinson's disease Brother    Mesothelioma Brother    Kidney disease Neg Hx    Prostate cancer Neg Hx    Kidney cancer Neg Hx    Bladder Cancer Neg Hx     Social History:  reports that he quit smoking about 25 years ago. His smoking use included cigarettes. He has never used smokeless tobacco. He reports that he does not drink alcohol and does not use drugs.  ROS: For pertinent review of systems please refer to history of present illness  Physical Exam: BP 113/70    Pulse 63    Ht 5\' 10"  (1.778 m)    Wt 175 lb (79.4 kg)    BMI 25.11 kg/m   Constitutional:  Well nourished. Alert and oriented, No acute distress. HEENT: [place] AT, mask in place.  Trachea midline Cardiovascular: No clubbing, cyanosis, or edema. Respiratory: Normal respiratory effort, no increased work of breathing. Neurologic: Grossly intact, no focal deficits, moving all 4 extremities. Psychiatric: Normal mood and affect.   Laboratory Data: CBC Latest Ref Rng & Units 05/03/2021 05/21/2019 05/20/2019  WBC 4.0 - 10.5 K/uL 4.3 4.2 3.4(L)  Hemoglobin 13.0 - 17.0 g/dL 14.6 10.5(L) 11.3(L)  Hematocrit 39.0 - 52.0 % 43.2 33.0(L) 35.0(L)  Platelets 150 - 400 K/uL 114(L) 113(L) 131(L)    BMP Latest Ref Rng & Units 05/03/2021 12/02/2019 05/21/2019  Glucose 70 - 99 mg/dL 124(H) - 108(H)  BUN 8 - 23 mg/dL 16 - 12  Creatinine 0.61 - 1.24 mg/dL 1.02 1.20 0.98  BUN/Creat Ratio 10 - 24 - - -  Sodium 135 - 145 mmol/L 139 - 136  Potassium 3.5 - 5.1 mmol/L 4.0 - 3.2(L)  Chloride 98 - 111 mmol/L 108 - 104  CO2 22 - 32 mmol/L 27 - 24  Calcium 8.9 - 10.3 mg/dL 9.4 - 8.3(L)      Component     Latest Ref Rng & Units 11/04/2021  Specific Gravity, UA     1.005 - 1.030 1.025  pH, UA     5.0 - 7.5 5.5  Color, UA     Yellow Yellow  Appearance Ur     Clear Clear  Leukocytes,UA     Negative Negative  Protein,UA     Negative/Trace Negative  Glucose, UA     Negative Negative  Ketones, UA     Negative Negative  RBC, UA     Negative Negative  Bilirubin, UA     Negative Negative  Urobilinogen, Ur     0.2 - 1.0 mg/dL 0.2  Nitrite, UA     Negative Negative  Microscopic Examination      See below:   Component     Latest Ref Rng & Units 11/04/2021  WBC, UA     0 - 5 /hpf None seen  RBC     0 - 2 /hpf None seen  Epithelial Cells (non renal)     0 - 10 /hpf None seen  Bacteria, UA     None seen/Few None seen  I have reviewed the labs.   Pertinent Imaging N/A    Assessment & Plan:   1. Bilateral nephrolithiasis - continue Urocit 10 mEq BID - asymptomatic at this time  2. BPH with LU TS - Continue conservative management, avoiding bladder irritants and  timed voiding's - Continue the tamsulosin 0.4 mg daily and finasteride 5 mg daily  3. High risk hematuria - work up x 2 - bilateral nephrolithiasis   Return in about 1 year (around 11/05/2022) for UA and I PSS .  These notes generated with voice recognition software. I apologize for typographical errors.  Zara Council, PA-C  [facility] [address] [place], [place] [address] [phone]

## 2021-11-04 ENCOUNTER — Ambulatory Visit (INDEPENDENT_AMBULATORY_CARE_PROVIDER_SITE_OTHER): Payer: Medicare Other | Admitting: Urology

## 2021-11-04 ENCOUNTER — Other Ambulatory Visit: Payer: Self-pay

## 2021-11-04 ENCOUNTER — Encounter: Payer: Self-pay | Admitting: Urology

## 2021-11-04 VITALS — BP 113/70 | HR 63 | Ht 70.0 in | Wt 175.0 lb

## 2021-11-04 DIAGNOSIS — N138 Other obstructive and reflux uropathy: Secondary | ICD-10-CM

## 2021-11-04 DIAGNOSIS — N200 Calculus of kidney: Secondary | ICD-10-CM | POA: Diagnosis not present

## 2021-11-04 DIAGNOSIS — R319 Hematuria, unspecified: Secondary | ICD-10-CM | POA: Diagnosis not present

## 2021-11-04 DIAGNOSIS — N401 Enlarged prostate with lower urinary tract symptoms: Secondary | ICD-10-CM | POA: Diagnosis not present

## 2021-11-04 LAB — URINALYSIS, COMPLETE
Bilirubin, UA: NEGATIVE
Glucose, UA: NEGATIVE
Ketones, UA: NEGATIVE
Leukocytes,UA: NEGATIVE
Nitrite, UA: NEGATIVE
Protein,UA: NEGATIVE
RBC, UA: NEGATIVE
Specific Gravity, UA: 1.025 (ref 1.005–1.030)
Urobilinogen, Ur: 0.2 mg/dL (ref 0.2–1.0)
pH, UA: 5.5 (ref 5.0–7.5)

## 2021-11-04 LAB — MICROSCOPIC EXAMINATION
Bacteria, UA: NONE SEEN
Epithelial Cells (non renal): NONE SEEN /hpf (ref 0–10)
RBC, Urine: NONE SEEN /hpf (ref 0–2)
WBC, UA: NONE SEEN /hpf (ref 0–5)

## 2022-03-14 ENCOUNTER — Other Ambulatory Visit: Payer: Self-pay | Admitting: Urology

## 2022-03-14 DIAGNOSIS — Z87898 Personal history of other specified conditions: Secondary | ICD-10-CM

## 2022-03-14 DIAGNOSIS — N401 Enlarged prostate with lower urinary tract symptoms: Secondary | ICD-10-CM

## 2022-05-26 ENCOUNTER — Encounter: Payer: Self-pay | Admitting: Neurosurgery

## 2022-05-26 ENCOUNTER — Ambulatory Visit (INDEPENDENT_AMBULATORY_CARE_PROVIDER_SITE_OTHER): Payer: Medicare Other | Admitting: Neurosurgery

## 2022-05-26 VITALS — BP 142/80 | Ht 70.0 in | Wt 181.0 lb

## 2022-05-26 DIAGNOSIS — M545 Low back pain, unspecified: Secondary | ICD-10-CM

## 2022-05-26 DIAGNOSIS — M533 Sacrococcygeal disorders, not elsewhere classified: Secondary | ICD-10-CM | POA: Diagnosis not present

## 2022-05-26 MED ORDER — METHOCARBAMOL 500 MG PO TABS: 500.0000 mg | ORAL_TABLET | Freq: Four times a day (QID) | ORAL | 0 refills | Status: DC | PRN

## 2022-05-26 MED ORDER — CELECOXIB 200 MG PO CAPS: 200.0000 mg | ORAL_CAPSULE | Freq: Two times a day (BID) | ORAL | 0 refills | Status: DC

## 2022-05-26 NOTE — Progress Notes (Signed)
Referring Physician:  No referring provider defined for this encounter.  Primary Physician:  Stephen Hector, MD  Procedure: Posterior lumbar fusion L1-L5 Date of Procedure: 03/13/2019  History of Present Illness: 05/26/2022 [name] is here today with a chief complaint of back pain.  He has had this for approximately 2 weeks.  His pain is to the left of his incision in his lower back.  It does not radiate into his legs.  He is very stiff.  He did not have any trauma.  He has had continued discomfort with activity over the last couple of weeks.   Bowel/Bladder Dysfunction: none  Conservative measures:  Physical therapy: None Multimodal medical therapy including regular antiinflammatories: Oxycodone Injections: No epidural steroid injections  Past Surgery: L1-5 lateral and posterior fusion in 2020  Stephen Cervantes has no symptoms of cervical myelopathy.  The symptoms are causing a significant impact on the patient's life.   Review of Systems:  A 10 point review of systems is negative, except for the pertinent positives and negatives detailed in the HPI.  Past Medical History: Past Medical History:  Diagnosis Date   Arthritis    Asthma    Bowel obstruction ([place]) 8413,2440   Depression    Diverticulitis 2006   Enlarged prostate    Gallstones 07/04/2014   History of gastrointestinal ulcer    History of kidney stones    HLD (hyperlipidemia)    HTN (hypertension)    Kidney stone    kidney stones   Neuritis or radiculitis due to rupture of lumbar intervertebral disc 05/29/2015   Thyroid disease     Past Surgical History: Past Surgical History:  Procedure Laterality Date   COLECTOMY  2006   Abdominal colectomy, ileostomy, Hartman procedure for perforation of cecum, sigmoid diverticulitis   HERNIA REPAIR  2008?   [place]   ILEOSTOMY  2007   JOINT REPLACEMENT  2014   Hip Replacement Stephen Cervantes   KNEE SURGERY Right 2011    POSTERIOR LUMBAR FUSION 4 LEVEL N/A 03/13/2019   Procedure: POSTERIOR LUMBAR FUSION 4 LEVEL L1-5;  Surgeon: Stephen Maw, MD;  Location: [facility] ORS;  Service: Neurosurgery;  Laterality: N/A;   TRIGGER FINGER RELEASE Right 05/05/2021   Procedure: RELEASE TRIGGER FINGER/A-1 PULLEY;  Surgeon: Stephen Mull, MD;  Location: [facility] ORS;  Service: Orthopedics;  Laterality: Right;    Allergies: Allergies as of 05/26/2022 - Review Complete 11/04/2021  Allergen Reaction Noted   Morphine and related Other (See Comments) 07/02/2014   Tape Other (See Comments) 07/02/2014    Medications: No outpatient medications have been marked as taking for the 05/26/22 encounter (Appointment) with Stephen Maw, MD.    Social History: Social History   Tobacco Use   Smoking status: Former    Types: Cigarettes    Quit date: 1998    Years since quitting: 25.7   Smokeless tobacco: Never  Vaping Use   Vaping Use: Never used  Substance Use Topics   Alcohol use: No    Alcohol/week: 0.0 standard drinks of alcohol   Drug use: No    Family Medical History: Family History  Problem Relation Age of Onset   Cancer Father        cancer of lung, brain  and lymphoma   Parkinson's disease Brother    Mesothelioma Brother    Kidney disease Neg Hx    Prostate cancer Neg Hx    Kidney cancer Neg Hx  Bladder Cancer Neg Hx     Physical Examination: There were no vitals filed for this visit.  General: Patient is well developed, well nourished, calm, collected, and in no apparent distress. Attention to examination is appropriate.  Neck:   Supple.  Full range of motion.  Respiratory: Patient is breathing without any difficulty.   NEUROLOGICAL:     Awake, alert, oriented to person, place, and time.  Speech is clear and fluent. Fund of knowledge is appropriate.   Cranial Nerves: Pupils equal round and reactive to light.  Facial tone is symmetric.  Facial sensation is symmetric. Shoulder shrug is symmetric.  Tongue protrusion is midline.   ROM of spine: Limited.    Strength: Moves upper extremities well  Side Iliopsoas Quads Hamstring PF DF EHL  R '5 5 5 5 5 5  '$ L '5 5 5 5 5 5   '$ Reflexes are 1+ and symmetric at the biceps, triceps, brachioradialis, patella and achilles.   Hoffman's is absent.   Bilateral upper and lower extremity sensation is intact to light touch.    No evidence of dysmetria noted.  Gait is antalgic.  He has tenderness to palpation about his left SI joint  Medical Decision Making  Imaging: No new imaging to review  I have personally reviewed the images and agree with the above interpretation.  Assessment and Plan: [name] is a pleasant 81 y.o. male with left-sided lower back pain without sciatica as well as possible left-sided sacroiliitis.  We discussed the options including physical therapy, NSAIDs, muscle relaxation, and injections.  I have offered him physical therapy referral and left sacroiliac joint injection, but he would like to hold off for now.  I will increase his Celebrex to 200 mg twice daily and give him a muscle relaxant.  We will touch base in 6 weeks.  If he is not better at that time, we will proceed with a CT scan of the lumbar spine and I will discuss injections and therapy with him again.   I spent a total of 30 minutes in face-to-face and non-face-to-face activities related to this patient's care today.  Thank you for involving me in the care of this patient.      Stephen Rutten K. Izora Ribas MD, [facility]

## 2022-07-05 ENCOUNTER — Ambulatory Visit (INDEPENDENT_AMBULATORY_CARE_PROVIDER_SITE_OTHER): Payer: Medicare Other | Admitting: Neurosurgery

## 2022-07-05 DIAGNOSIS — M545 Low back pain, unspecified: Secondary | ICD-10-CM | POA: Diagnosis not present

## 2022-07-05 DIAGNOSIS — Z981 Arthrodesis status: Secondary | ICD-10-CM

## 2022-07-05 NOTE — Progress Notes (Signed)
Referring Physician:  Adin Hector, MD [facility] [place],  [place] [address]  Primary Physician:  Adin Hector, MD  Procedure: Posterior lumbar fusion L1-L5 Date of Procedure: 03/13/2019  History of Present Illness: 07/05/2022 Stephen Cervantes is doing much better.  Stephen Cervantes changed some medications and has much less.  05/26/2022 Stephen Cervantes is here today with a chief complaint of back pain.  Stephen Cervantes has had this for approximately 2 weeks.  His pain is to the left of his incision in his lower back.  It does not radiate into his legs.  Stephen Cervantes is very stiff.  Stephen Cervantes did not have any trauma.  Stephen Cervantes has had continued discomfort with activity over the last couple of weeks.   Bowel/Bladder Dysfunction: none  Conservative measures:  Physical therapy: None Multimodal medical therapy including regular antiinflammatories: Oxycodone Injections: No epidural steroid injections  Past Surgery: L1-5 lateral and posterior fusion in 2020  Stephen Cervantes has no symptoms of cervical myelopathy.  The symptoms are causing a significant impact on the patient's life.   Review of Systems:  A 10 point review of systems is negative, except for the pertinent positives and negatives detailed in the HPI.  Past Medical History: Past Medical History:  Diagnosis Date   Arthritis    Asthma    Bowel obstruction ([place]) 2671,2458   Depression    Diverticulitis 2006   Enlarged prostate    Gallstones 07/04/2014   History of gastrointestinal ulcer    History of kidney stones    HLD (hyperlipidemia)    HTN (hypertension)    Kidney stone    kidney stones   Neuritis or radiculitis due to rupture of lumbar intervertebral disc 05/29/2015   Thyroid disease     Past Surgical History: Past Surgical History:  Procedure Laterality Date   COLECTOMY  2006   Abdominal colectomy, ileostomy, Hartman procedure for perforation of cecum, sigmoid diverticulitis   HERNIA REPAIR  2008?   [place]   ILEOSTOMY  2007   JOINT REPLACEMENT  2014   Hip Replacement Hessie Knows   KNEE SURGERY Right 2011   POSTERIOR LUMBAR FUSION 4 LEVEL N/A 03/13/2019   Procedure: POSTERIOR LUMBAR FUSION 4 LEVEL L1-5;  Surgeon: Meade Maw, MD;  Location: [facility] ORS;  Service: Neurosurgery;  Laterality: N/A;   TRIGGER FINGER RELEASE Right 05/05/2021   Procedure: RELEASE TRIGGER FINGER/A-1 PULLEY;  Surgeon: Corky Mull, MD;  Location: [facility] ORS;  Service: Orthopedics;  Laterality: Right;    Allergies: Allergies as of 07/05/2022 - Review Complete 05/26/2022  Allergen Reaction Noted   Morphine and related Other (See Comments) 07/02/2014   Tape Other (See Comments) 07/02/2014    Medications: No outpatient medications have been marked as taking for the 07/05/22 encounter (Appointment) with Meade Maw, MD.    Social History: Social History   Tobacco Use   Smoking status: Former    Types: Cigarettes    Quit date: 1998    Years since quitting: 25.8   Smokeless tobacco: Never  Vaping Use   Vaping Use: Never used  Substance Use Topics   Alcohol use: No    Alcohol/week: 0.0 standard drinks of alcohol   Drug use: No    Family Medical History: Family History  Problem Relation Age of Onset   Cancer Father        cancer of lung, brain  and lymphoma   Parkinson's disease Brother  Mesothelioma Brother    Kidney disease Neg Hx    Prostate cancer Neg Hx    Kidney cancer Neg Hx    Bladder Cancer Neg Hx     Physical Examination: There were no vitals filed for this visit.  Medical Decision Making  Imaging: No new imaging to review    Assessment and Plan: Stephen Cervantes is a pleasant 81 y.o. male with improvement in his prior pain exacerbation.  Given his improvement, we will not proceed with any additional imaging.  Stephen Cervantes will let us know if Stephen Cervantes needs additional work-up or treatment. This visit was performed via telephone.  Patient location: home Provider  location: office  I spent a total of 5 minutes non-face-to-face activities for this visit on the date of this encounter including review of current clinical condition and response to treatment.      Cesareo Vickrey K. Izora Ribas MD, [facility] Neurosurgery

## 2022-07-19 ENCOUNTER — Other Ambulatory Visit: Payer: Self-pay | Admitting: Neurosurgery

## 2022-11-07 NOTE — Progress Notes (Unsigned)
11/08/2022 1:07 PM   Stephen Cervantes 12/18/1940 [place]:[id number]  Referring provider: Adin Hector, MD [facility] [facility] [place],  [place] [address]  No chief complaint on file.  Urological history: 1. High risk hematuria - former smoker [id number] - 2016 - CT Urogram - nephrolithiasis, cysto with very large prostate - Cystoscopy with Dr. Tresa Cervantes in 09/2015 enlarged prostate - 11/2019 - CT Urogram - Bilateral nonobstructing renal stones.  Single small cysts are noted in each kidney with numerous tiny hypodensities in the kidneys bilaterally, too small to characterize - cystoscopy 11/2019 - NED - no reports of gross heme - UA (08/2022) negative for micro heme  2. Nephrolithiasis - Pre 2016 - medicla passage x several, URS x 1 - 08/2015 - medical passage left 86m ureteral stone, LLP 1cm non-obstructing as well. No right sided stones - CT (2021) -bilateral nonobstructing renal stones - Composition Uric Acid  - managed with KCit 20 BID   3. BPH with LU TS - PSA 1.66 in 08/2020 - I PSS *** - tamsulosin 0.4 mg daily and finasteride 5 mg daily   4. 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  Presented to the ED on 03/17/2019 and had Foley place for > 800cc of urine - He presented to the office on 03/27/2019 for a TOV and was instructed in [facility].  Failed 1 st TOV on 07/22 and had Foley placed for > 1000cc.   - Foley catheter was removed 04/09/2019 - return scan 110 mL   HPI: Stephen CONKEYis a 82y.o. male who presents today for a yearly follow up.         Score:  1-7 Mild 8-19 Moderate 20-35 Severe    PMH: Past Medical History:  Diagnosis Date   Arthritis    Asthma    Bowel obstruction ([place]:[id number]  Depression    Diverticulitis 2006   Enlarged prostate    Gallstones 07/04/2014   History of gastrointestinal ulcer    History of kidney stones    HLD (hyperlipidemia)    HTN (hypertension)    Kidney stone    kidney stones    Neuritis or radiculitis due to rupture of lumbar intervertebral disc 05/29/2015   Thyroid disease     Surgical History: Past Surgical History:  Procedure Laterality Date   COLECTOMY  2006   Abdominal colectomy, ileostomy, Hartman procedure for perforation of cecum, sigmoid diverticulitis   [place]IR  2008?   [place]  ILEOSTOMY  2007   JOINT REPLACEMENT  2014   Hip Replacement MHessie Cervantes  KNEE SURGERY Right 2011   POSTERIOR LUMBAR FUSION 4 LEVEL N/A 03/13/2019   Procedure: POSTERIOR LUMBAR FUSION 4 LEVEL L1-5;  Surgeon: Stephen Maw MD;  Location: [facility] ORS;  Service: Neurosurgery;  Laterality: N/A;   TRIGGER FINGER RELEASE Right 05/05/2021   Procedure: RELEASE TRIGGER FINGER/A-1 PULLEY;  Surgeon: Stephen Mull MD;  Location: [facility] ORS;  Service: Orthopedics;  Laterality: Right;    Home Medications:  Allergies as of 11/08/2022       Reactions   Morphine And Related Other (See Comments)   Severe hallucinations   Tape Other (See Comments)   Silk Tape per patient "peels off my skin"        Medication List        Accurate as of November 07, 2022  1:07 PM. If you have any questions, ask your nurse or doctor.  acetaminophen 325 MG tablet Commonly known as: TYLENOL Take 2 tablets (650 mg total) by mouth every 4 (four) hours as needed for mild pain ((score 1 to 3) or temp > 100.5).   atorvastatin 10 MG tablet Commonly known as: LIPITOR Take 10 mg by mouth daily.   celecoxib 200 MG capsule Commonly known as: CELEBREX TAKE 1 CAPSULE BY MOUTH TWICE DAILY   ferrous sulfate 325 (65 FE) MG tablet Take 325 mg by mouth daily with breakfast.   finasteride 5 MG tablet Commonly known as: PROSCAR TAKE ONE TABLET EVERY DAY   levothyroxine 100 MCG tablet Commonly known as: SYNTHROID Take 100 mcg by mouth daily before breakfast.   loperamide 2 MG capsule Commonly known as: IMODIUM Take 4 mg by mouth in the morning, Cervantes noon, and Cervantes  bedtime.   losartan 25 MG tablet Commonly known as: COZAAR Take 25 mg by mouth daily.   methocarbamol 500 MG tablet Commonly known as: ROBAXIN Take 1 tablet (500 mg total) by mouth every 6 (six) hours as needed for muscle spasms.   pantoprazole 40 MG tablet Commonly known as: PROTONIX Take 40 mg by mouth daily before breakfast.   potassium citrate 10 MEQ (1080 MG) SR tablet Commonly known as: UROCIT-K Take 20 mEq by mouth 2 (two) times daily.   sucralfate 1 g tablet Commonly known as: CARAFATE Take 1 g by mouth in the morning, Cervantes noon, and Cervantes bedtime.   tamsulosin 0.4 MG Caps capsule Commonly known as: FLOMAX TAKE 1 CAPSULE BY MOUTH ONCE DAILY        Allergies:  Allergies  Allergen Reactions   Morphine And Related Other (See Comments)    Severe hallucinations   Tape Other (See Comments)    Silk Tape per patient "peels off my skin"    Family History: Family History  Problem Relation Age of Onset   Cancer Father        cancer of lung, brain  and lymphoma   Parkinson's disease Brother    Mesothelioma Brother    Kidney disease Neg Hx    Prostate cancer Neg Hx    Kidney cancer Neg Hx    Bladder Cancer Neg Hx     Social History:  reports that he quit smoking about 26 years ago. His smoking use included cigarettes. He has never used smokeless tobacco. He reports that he does not drink alcohol and does not use drugs.  ROS: For pertinent review of systems please refer to history of present illness  Physical Exam: There were no vitals taken for this visit.  Constitutional:  Well nourished. Alert and oriented, No acute distress. HEENT: Stephen Cervantes, moist mucus membranes.  Trachea midline Cardiovascular: No clubbing, cyanosis, or edema. Respiratory: Normal respiratory effort, no increased work of breathing. GU: No CVA tenderness.  No bladder fullness or masses.  Patient with circumcised/uncircumcised phallus. ***Foreskin easily retracted***  Urethral meatus is patent.  No  penile discharge. No penile lesions or rashes. Scrotum without lesions, cysts, rashes and/or edema.  Testicles are located scrotally bilaterally. No masses are appreciated in the testicles. Left and right epididymis are normal. Rectal: Patient with  normal sphincter tone. Anus and perineum without scarring or rashes. No rectal masses are appreciated. Prostate is approximately *** grams, *** nodules are appreciated. Seminal vesicles are normal. Neurologic: Grossly intact, no focal deficits, moving all 4 extremities. Psychiatric: Normal mood and affect.   Laboratory Data: TSH (10/2022) 3.335 Serum creatinine (08/2022) 1.1 Total cholesterol (08/2022) 125 Urinalysis (08/2022)  negative  I have reviewed the labs.   Pertinent Imaging N/A    Assessment & Plan:   1. Bilateral nephrolithiasis - continue Urocit-K 10 mEq BID - asymptomatic Cervantes this time - UA pH Cervantes 5.5 ***  2. BPH with LU TS - Continue conservative management, avoiding bladder irritants and timed voiding's - Continue the tamsulosin 0.4 mg daily and finasteride 5 mg daily  3. High risk hematuria - work up x 2 - bilateral nephrolithiasis   No follow-ups on file.  These notes generated with voice recognition software. I apologize for typographical errors.  Brandon, [place] [address] [place], [place] [address] [phone]

## 2022-11-08 ENCOUNTER — Ambulatory Visit (INDEPENDENT_AMBULATORY_CARE_PROVIDER_SITE_OTHER): Payer: Medicare Other | Admitting: Urology

## 2022-11-08 ENCOUNTER — Encounter: Payer: Self-pay | Admitting: Urology

## 2022-11-08 VITALS — BP 160/78 | HR 61 | Ht 70.0 in | Wt 181.0 lb

## 2022-11-08 DIAGNOSIS — N401 Enlarged prostate with lower urinary tract symptoms: Secondary | ICD-10-CM

## 2022-11-08 DIAGNOSIS — N138 Other obstructive and reflux uropathy: Secondary | ICD-10-CM

## 2022-11-08 DIAGNOSIS — R319 Hematuria, unspecified: Secondary | ICD-10-CM

## 2022-11-08 DIAGNOSIS — N200 Calculus of kidney: Secondary | ICD-10-CM | POA: Diagnosis not present

## 2022-12-09 ENCOUNTER — Other Ambulatory Visit: Payer: Self-pay | Admitting: Neurosurgery

## 2023-01-11 ENCOUNTER — Other Ambulatory Visit: Payer: Self-pay | Admitting: Urology

## 2023-01-11 ENCOUNTER — Other Ambulatory Visit: Payer: Self-pay | Admitting: Physician Assistant

## 2023-01-11 DIAGNOSIS — Z87898 Personal history of other specified conditions: Secondary | ICD-10-CM

## 2023-01-11 DIAGNOSIS — N138 Other obstructive and reflux uropathy: Secondary | ICD-10-CM

## 2023-03-13 ENCOUNTER — Other Ambulatory Visit: Payer: Self-pay | Admitting: Neurosurgery

## 2023-03-13 DIAGNOSIS — Z981 Arthrodesis status: Secondary | ICD-10-CM

## 2023-03-13 NOTE — Telephone Encounter (Signed)
Celebrex refilled, but I recommend that he go down to once a day.   Please let him know.

## 2023-03-13 NOTE — Telephone Encounter (Signed)
Patient aware of med refill and dosage change 

## 2023-11-06 NOTE — Progress Notes (Unsigned)
 11/08/2023 9:35 AM   Stephen Cervantes [birth year] [id number]  Referring provider: Lynnea Ferrier, MD [address] [facility] [place],  [place] [address]  Urological history: 1. High risk hematuria - former smoker (08MV) - 2016 - CT Urogram - nephrolithiasis, cysto with very large prostate - Cystoscopy with Dr. Berneice Heinrich in 09/2015 enlarged prostate - 11/2019 - CT Urogram - Bilateral nonobstructing renal stones.  Single small cysts are noted in each kidney with numerous tiny hypodensities in the kidneys bilaterally, too small to characterize - cystoscopy 11/2019 - NED  2. Nephrolithiasis - Pre 2016 - medicla passage x several, URS x 1 - 08/2015 - medical passage left 6mm ureteral stone, LLP 1cm non-obstructing as well. No right sided stones - CT (2021) -bilateral nonobstructing renal stones - Composition Uric Acid  - managed with KCit 20 BID   3. BPH with LU TS - PSA 1.66 in 08/2020 - tamsulosin 0.4 mg daily and finasteride 5 mg daily   4. 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  Presented to the ED on 03/17/2019 and had Foley place for > 800cc of urine - He presented to the office on 03/27/2019 for a TOV and was instructed in [facility].  Failed 1 st TOV on 07/22 and had Foley placed for > 1000cc.   - Foley catheter was removed 04/09/2019 - return scan 110 mL  Chief Complaint  Patient presents with   Follow-up    HPI: Stephen Cervantes is a 83 y.o. male who presents today for a yearly follow up.   Previous records reviewed.   UA w/ Dr.Klein on 10/12/2023 > 182 RBC's.    I PSS 0/0  He has no urinary complaints.  Patient denies any modifying or aggravating factors.  Patient denies any recent UTI's, gross hematuria, dysuria or suprapubic/flank pain.  Patient denies any fevers, chills, nausea or vomiting.    UA yellow clear, specific gravity 1.025, pH 5.5, WBC 0-5, 0-2 RBCs, 0-10 epithelial cells and a few bacteria.   IPSS     Row Name 11/08/23  0900         International Prostate Symptom Score   How often have you had the sensation of not emptying your bladder? Not at All     How often have you had to urinate less than every two hours? Not at All     How often have you found you stopped and started again several times when you urinated? Not at All     How often have you found it difficult to postpone urination? Not at All     How often have you had a weak urinary stream? Not at All     How often have you had to strain to start urination? Not at All     How many times did you typically get up at night to urinate? None     Total IPSS Score 0       Quality of Life due to urinary symptoms   If you were to spend the rest of your life with your urinary condition just the way it is now how would you feel about that? Delighted             Score:  1-7 Mild 8-19 Moderate 20-35 Severe    PMH: Past Medical History:  Diagnosis Date   Arthritis    Asthma    Bowel obstruction (HCC) 7846,9629   Depression    Diverticulitis 2006  Enlarged prostate    Gallstones 07/04/2014   History of gastrointestinal ulcer    History of kidney stones    HLD (hyperlipidemia)    HTN (hypertension)    Kidney stone    kidney stones   Neuritis or radiculitis due to rupture of lumbar intervertebral disc 05/29/2015   Thyroid disease     Surgical History: Past Surgical History:  Procedure Laterality Date   COLECTOMY  2006   Abdominal colectomy, ileostomy, Hartman procedure for perforation of cecum, sigmoid diverticulitis   HERNIA REPAIR  2008?   [facility]   ILEOSTOMY  2007   JOINT REPLACEMENT  2014   Hip Replacement [facility] Bucker   KNEE SURGERY Right 2011   POSTERIOR LUMBAR FUSION 4 LEVEL N/A 03/13/2019   Procedure: POSTERIOR LUMBAR FUSION 4 LEVEL L1-5;  Surgeon: Venetia Night, MD;  Location: [facility] ORS;  Service: Neurosurgery;  Laterality: N/A;   TRIGGER FINGER RELEASE Right 05/05/2021   Procedure: RELEASE  TRIGGER FINGER/A-1 PULLEY;  Surgeon: Christena Flake, MD;  Location: [facility] ORS;  Service: Orthopedics;  Laterality: Right;    Home Medications:  Allergies as of 11/08/2023       Reactions   Morphine And Codeine Other (See Comments)   Severe hallucinations   Tape Other (See Comments)   Silk Tape per patient "peels off my skin"        Medication List        Accurate as of November 08, 2023  9:35 AM. If you have any questions, ask your nurse or doctor.          STOP taking these medications    acetaminophen 325 MG tablet Commonly known as: TYLENOL Stopped by: Dyllin Gulley   methocarbamol 500 MG tablet Commonly known as: ROBAXIN Stopped by: Rooney Gladwin       TAKE these medications    atorvastatin 10 MG tablet Commonly known as: LIPITOR Take 10 mg by mouth daily.   celecoxib 200 MG capsule Commonly known as: CELEBREX Take 1 capsule (200 mg total) by mouth daily.   ferrous sulfate 325 (65 FE) MG tablet Take 325 mg by mouth daily with breakfast.   finasteride 5 MG tablet Commonly known as: PROSCAR TAKE ONE TABLET EVERY DAY   levothyroxine 100 MCG tablet Commonly known as: SYNTHROID Take 100 mcg by mouth daily before breakfast.   loperamide 2 MG capsule Commonly known as: IMODIUM Take 4 mg by mouth in the morning, at noon, and at bedtime.   losartan 25 MG tablet Commonly known as: COZAAR Take 25 mg by mouth daily.   montelukast 10 MG tablet Commonly known as: SINGULAIR Take 1 tablet by mouth daily.   Multi-Vitamin tablet Take 1 tablet by mouth daily.   pantoprazole 40 MG tablet Commonly known as: PROTONIX Take 40 mg by mouth daily before breakfast.   potassium citrate 10 MEQ (1080 MG) SR tablet Commonly known as: UROCIT-K Take 20 mEq by mouth 2 (two) times daily.   sucralfate 1 g tablet Commonly known as: CARAFATE Take 1 g by mouth in the morning, at noon, and at bedtime.   tamsulosin 0.4 MG Caps capsule Commonly known as: FLOMAX TAKE 1  CAPSULE BY MOUTH ONCE DAILY        Allergies:  Allergies  Allergen Reactions   Morphine And Codeine Other (See Comments)    Severe hallucinations   Tape Other (See Comments)    Silk Tape per patient "peels off my skin"    Family  History: Family History  Problem Relation Age of Onset   Cancer Father        cancer of lung, brain  and lymphoma   Parkinson's disease Brother    Mesothelioma Brother    Kidney disease Neg Hx    Prostate cancer Neg Hx    Kidney cancer Neg Hx    Bladder Cancer Neg Hx     Social History:  reports that he quit smoking about 27 years ago. His smoking use included cigarettes. He has never used smokeless tobacco. He reports that he does not drink alcohol and does not use drugs.  ROS: For pertinent review of systems please refer to history of present illness  Physical Exam: BP 130/79   Pulse 69   Constitutional:  Well nourished. Alert and oriented, No acute distress. HEENT: [place] AT, moist mucus membranes.  Trachea midline Cardiovascular: No clubbing, cyanosis, or edema. Respiratory: Normal respiratory effort, no increased work of breathing. Neurologic: Grossly intact, no focal deficits, moving all 4 extremities. Psychiatric: Normal mood and affect.   Laboratory Data: CBC w/auto Differential (5 Part) Order: [id number] Component Ref Range & Units 3 wk ago  WBC (White Blood Cell Count) 4.1 - 10.2 10^3/uL 4.3  RBC (Red Blood Cell Count) 4.69 - 6.13 10^6/uL 4.62 Low   Hemoglobin 14.1 - 18.1 gm/dL 40.9  Hematocrit 81.1 - 52.0 % 41.9  MCV (Mean Corpuscular Volume) 80.0 - 100.0 fl 90.7  MCH (Mean Corpuscular Hemoglobin) 27.0 - 31.2 pg 30.5  MCHC (Mean Corpuscular Hemoglobin Concentration) 32.0 - 36.0 gm/dL 91.4  Platelet Count 782 - 450 10^3/uL 145 Low   RDW-CV (Red Cell Distribution Width) 11.6 - 14.8 % 13.7  MPV (Mean Platelet Volume) 9.4 - 12.4 fl 10  Neutrophils 1.50 - 7.80 10^3/uL 2.63  Lymphocytes 1.00 - 3.60 10^3/uL 0.87 Low    Monocytes 0.00 - 1.50 10^3/uL 0.48  Eosinophils 0.00 - 0.55 10^3/uL 0.23  Basophils 0.00 - 0.09 10^3/uL 0.05  Neutrophil % 32.0 - 70.0 % 61.6  Lymphocyte % 10.0 - 50.0 % 20.4  Monocyte % 4.0 - 13.0 % 11.2  Eosinophil % 1.0 - 5.0 % 5.4 High   Basophil% 0.0 - 2.0 % 1.2  Immature Granulocyte % <=0.7 % 0.2  Immature Granulocyte Count <=0.06 10^3/L 0.01  Resulting Agency [facility]   Specimen Collected: 10/12/23 09:57   Performed by: [facility] Last Resulted: 10/12/23 10:17  Received From: [facility]  Result Received: 11/06/23 22:02   Comprehensive Metabolic Panel (CMP) Order: [id number] Component Ref Range & Units 3 wk ago  Glucose 70 - 110 mg/dL 92  Sodium 578 - 469 mmol/L 140  Potassium 3.6 - 5.1 mmol/L 4.7  Chloride 97 - 109 mmol/L 109  Carbon Dioxide (CO2) 22.0 - 32.0 mmol/L 28.1  Urea Nitrogen (BUN) 7 - 25 mg/dL 23  Creatinine 0.7 - 1.3 mg/dL 1.3  Glomerular Filtration Rate (eGFR) >60 mL/min/1.73sq m 55 Low   Comment: CKD-EPI (2021) does not include patient's race in the calculation of eGFR.  Monitoring changes of plasma creatinine and eGFR over time is useful for monitoring kidney function.  Interpretive Ranges for eGFR (CKD-EPI 2021):  eGFR:       >60 mL/min/1.73 sq. m - Normal eGFR:       30-59 mL/min/1.73 sq. m - Moderately Decreased eGFR:       15-29 mL/min/1.73 sq. m  - Severely Decreased eGFR:       < 15 mL/min/1.73  sq. m  - Kidney Failure   Note: These eGFR calculations do not apply in acute situations when eGFR is changing rapidly or patients on dialysis.  Calcium 8.7 - 10.3 mg/dL 9.5  AST 8 - 39 U/L 26  ALT 6 - 57 U/L 22  Alk Phos (alkaline Phosphatase) 34 - 104 U/L 97  Albumin 3.5 - 4.8 g/dL 4  Bilirubin, Total 0.3 - 1.2 mg/dL 0.8  Protein, Total 6.1 - 7.9 g/dL 6.2  A/G Ratio 1.0 - 5.0 gm/dL 1.8  Resulting Agency [facility]   Specimen Collected: 10/12/23 09:57    Performed by: [facility] Last Resulted: 10/12/23 13:46  Received From: [facility]  Result Received: 11/06/23 22:02   Lipid Panel w/calc LDL Order: [id number] Component Ref Range & Units 3 wk ago  Cholesterol, Total 100 - 200 mg/dL 295  Triglyceride 35 - 199 mg/dL 284  HDL (High Density Lipoprotein) Cholesterol 29.0 - 71.0 mg/dL 13.2  LDL Calculated 0 - 130 mg/dL 47  VLDL Cholesterol mg/dL 22  Cholesterol/HDL Ratio 2.3  Resulting Agency [facility]   Specimen Collected: 10/12/23 09:57   Performed by: [facility] Last Resulted: 10/12/23 13:46  Received From: [facility]  Result Received: 11/06/23 22:02   Thyroid Stimulating-Hormone (TSH) Order: [id number] Component Ref Range & Units 3 wk ago  Thyroid Stimulating Hormone (TSH) 0.450-5.330 uIU/ml uIU/mL 0.975  Resulting Agency [facility]   Specimen Collected: 10/12/23 09:57   Performed by: [facility] Last Resulted: 10/12/23 17:12  Received From: [facility]  Result Received: 11/06/23 22:02   Urinalysis w/Microscopic Order: [id number] Component Ref Range & Units 3 wk ago  Color Colorless, Straw, Light Yellow, Yellow, Dark Yellow Yellow  Clarity Clear Cloudy Abnormal   Specific Gravity 1.005 - 1.030 1.015  pH, Urine 5.0 - 8.0 5  Protein, Urinalysis Negative mg/dL Negative  Glucose, Urinalysis Negative mg/dL Negative  Ketones, Urinalysis Negative mg/dL Negative  Blood, Urinalysis Negative 3+ Abnormal   Nitrite, Urinalysis Negative Negative  Leukocyte Esterase, Urinalysis Negative Negative  Bilirubin, Urinalysis Negative Negative  Urobilinogen, Urinalysis 0.2 - 1.0 mg/dL 0.2  WBC, UA <=5 /hpf 0  Red Blood Cells, Urinalysis <=3 /hpf >182 High   Bacteria, Urinalysis 0 - 5 /hpf 0-5  Squamous Epithelial Cells, Urinalysis /hpf 0  Resulting Agency [facility]    Specimen Collected: 10/12/23 09:57   Performed by: [facility] Last Resulted: 10/12/23 10:25  Received From: [facility]  Result Received: 11/06/23 22:02   Urinalysis  See EPIC and HPI I have reviewed the labs.   Pertinent Imaging N/A    Assessment & Plan:   1. Bilateral nephrolithiasis - continue Urocit-K 20 mEq BID - change his doses to one 20 mg in the am, one 20 mg in the afternoon and two 20 mg qhs - asymptomatic at this time - UA pH at 5.5 - he is drinking Pepsi's   2. BPH with LU TS - Continue conservative management, avoiding bladder irritants and timed voiding's - Continue the tamsulosin 0.4 mg daily and finasteride 5 mg daily  3. High risk hematuria - work up x 2 - bilateral nephrolithiasis  - his last work up was in 2021 - recommend repeating work up with CTU and cystoscopy, he is in agreement - we discussed that there are a number of causes  that can be associated with blood in the urine, such as stones, BPH, UTI's, damage to the urinary tract and/or cancer.   Sometimes, we do not find a cause or source of the hematuria.   -we discussed that new guidelines place individuals into risk categories of low, intermediate and high risk categories.  These factors are based on age, smoking history and degree of blood in urine.   -we discussed that the recommended protocol for further work up are are CT urogram and cysto -we discussed that for a CT urogram a contrast material will be injected into a vein and that in rare instances, an allergic reaction can result and may even life threatening (1:100,000)  The patient denies any allergies to contrast, iodine and/or seafood and is not taking metformin - we discussed that following the imaging study,  a cystoscopy is performed  -we discussed that a cystoscopy is a procedure that consists of passing a camera up their urethra after administering lidocaine to anesthetize and that after the procedure a  minor amount of blood in the urine and/or burning which usually resolves in 24 to 48 hours may occur  -the patient had the opportunity to ask questions which were answered. Based upon this discussion, the patient is willing to proceed. Therefore, I've ordered: a CT Urogram and cystoscopy  - The patient will return following all of the above for discussion of the results.  - UA benign - Urine culture pending   Return for CT urogram report and cysto for worsening micro heme w/ Dr. Lonna Cobb .  These notes generated with voice recognition software. I apologize for typographical errors.  Cloretta Ned  [facility] [address] [place], [place] [address] [phone]

## 2023-11-08 ENCOUNTER — Encounter: Payer: Self-pay | Admitting: Urology

## 2023-11-08 ENCOUNTER — Ambulatory Visit (INDEPENDENT_AMBULATORY_CARE_PROVIDER_SITE_OTHER): Payer: Medicare Other | Admitting: Urology

## 2023-11-08 VITALS — BP 130/79 | HR 69

## 2023-11-08 DIAGNOSIS — R319 Hematuria, unspecified: Secondary | ICD-10-CM

## 2023-11-08 DIAGNOSIS — N200 Calculus of kidney: Secondary | ICD-10-CM

## 2023-11-08 DIAGNOSIS — N401 Enlarged prostate with lower urinary tract symptoms: Secondary | ICD-10-CM | POA: Diagnosis not present

## 2023-11-08 DIAGNOSIS — R3129 Other microscopic hematuria: Secondary | ICD-10-CM

## 2023-11-08 DIAGNOSIS — N138 Other obstructive and reflux uropathy: Secondary | ICD-10-CM

## 2023-11-08 LAB — URINALYSIS, COMPLETE
Bilirubin, UA: NEGATIVE
Glucose, UA: NEGATIVE
Ketones, UA: NEGATIVE
Leukocytes,UA: NEGATIVE
Nitrite, UA: NEGATIVE
Protein,UA: NEGATIVE
RBC, UA: NEGATIVE
Specific Gravity, UA: 1.025 (ref 1.005–1.030)
Urobilinogen, Ur: 0.2 mg/dL (ref 0.2–1.0)
pH, UA: 5.5 (ref 5.0–7.5)

## 2023-11-08 LAB — MICROSCOPIC EXAMINATION

## 2023-11-12 LAB — CULTURE, URINE COMPREHENSIVE

## 2023-12-13 ENCOUNTER — Ambulatory Visit: Admitting: Urology

## 2023-12-13 ENCOUNTER — Encounter: Payer: Self-pay | Admitting: Urology

## 2023-12-13 VITALS — BP 130/74 | HR 76 | Ht 67.0 in | Wt 180.0 lb

## 2023-12-13 DIAGNOSIS — R3129 Other microscopic hematuria: Secondary | ICD-10-CM

## 2023-12-13 LAB — MICROSCOPIC EXAMINATION: RBC, Urine: >30 /HPF — AB (ref 0–2)

## 2023-12-13 LAB — URINALYSIS, COMPLETE
Bilirubin, UA: NEGATIVE
Glucose, UA: NEGATIVE
Ketones, UA: NEGATIVE
Leukocytes,UA: NEGATIVE
Nitrite, UA: NEGATIVE
Protein,UA: NEGATIVE
Specific Gravity, UA: 1.02 (ref 1.005–1.030)
Urobilinogen, Ur: 0.2 mg/dL (ref 0.2–1.0)
pH, UA: 5.5 (ref 5.0–7.5)

## 2023-12-13 NOTE — Progress Notes (Signed)
   12/13/23  CC:  Chief Complaint  Patient presents with   Cysto    HPI: Refer to [facility]'s note 11/08/2023.  CTU was ordered but has not been scheduled; he states he was not contacted by radiology.  UA today dipstick 3+ blood/microscopy >30 RBC  Blood pressure 130/74, pulse 76, height 5\' 7"  (1.702 m), weight 180 lb (81.6 kg). NED. A&Ox3.   No respiratory distress   Abd soft, NT, ND Normal phallus with bilateral descended testicles  Cystoscopy Procedure Note  Patient identification was confirmed, informed consent was obtained, and patient was prepped using Betadine solution.  Lidocaine jelly was administered per urethral meatus.     Pre-Procedure: - Inspection reveals a normal caliber urethral meatus.  Procedure: The flexible cystoscope was introduced without difficulty - No urethral strictures/lesions are present. - Prominent lateral lobe enlargement prostate with prominent superficial vessels - Moderate elevation bladder neck - Bilateral ureteral orifices identified - Bladder mucosa  reveals no ulcers, tumors, or lesions - No bladder stones - Moderate trabeculation with scattered diverticula  Retroflexion shows backbleeding from prostate; small intravesical median lobe   Post-Procedure: - Patient tolerated the procedure well  Assessment/ Plan: BPH with hypervascularity/friability Urine cytology sent He was given the radiology scheduling number to schedule CT urogram and will be notified with the results    Riki Altes, MD

## 2023-12-13 NOTE — Patient Instructions (Addendum)
 [phone] ct hematuria scan

## 2024-01-04 ENCOUNTER — Ambulatory Visit
Admission: RE | Admit: 2024-01-04 | Discharge: 2024-01-04 | Disposition: A | Discharge status: Home / Self Care | Source: Ambulatory Visit | Attending: Urology | Admitting: Urology

## 2024-01-04 DIAGNOSIS — R3129 Other microscopic hematuria: Secondary | ICD-10-CM | POA: Diagnosis present

## 2024-01-04 MED ORDER — IOHEXOL 300 MG/ML  SOLN
100.0000 mL | Freq: Once | INTRAMUSCULAR | Status: AC | PRN
  Administered 2024-01-04: 100 mL via INTRAVENOUS

## 2024-01-12 ENCOUNTER — Other Ambulatory Visit: Payer: Self-pay | Admitting: Urology

## 2024-01-12 DIAGNOSIS — Z87898 Personal history of other specified conditions: Secondary | ICD-10-CM

## 2024-01-12 DIAGNOSIS — N138 Other obstructive and reflux uropathy: Secondary | ICD-10-CM

## 2024-01-18 ENCOUNTER — Ambulatory Visit: Payer: Self-pay | Admitting: Urology

## 2024-01-24 ENCOUNTER — Ambulatory Visit (INDEPENDENT_AMBULATORY_CARE_PROVIDER_SITE_OTHER): Admitting: Urology

## 2024-01-24 ENCOUNTER — Encounter: Payer: Self-pay | Admitting: Urology

## 2024-01-24 VITALS — BP 146/78 | HR 71 | Ht 69.0 in | Wt 175.0 lb

## 2024-01-24 DIAGNOSIS — N200 Calculus of kidney: Secondary | ICD-10-CM | POA: Diagnosis not present

## 2024-01-24 DIAGNOSIS — C642 Malignant neoplasm of left kidney, except renal pelvis: Secondary | ICD-10-CM | POA: Diagnosis not present

## 2024-01-24 DIAGNOSIS — N138 Other obstructive and reflux uropathy: Secondary | ICD-10-CM

## 2024-01-24 DIAGNOSIS — R319 Hematuria, unspecified: Secondary | ICD-10-CM

## 2024-01-24 DIAGNOSIS — N401 Enlarged prostate with lower urinary tract symptoms: Secondary | ICD-10-CM | POA: Diagnosis not present

## 2024-01-24 NOTE — Progress Notes (Signed)
 01/24/2024 4:50 PM   [name] 1941-03-15 [id number]  Referring provider: Melchor Spoon, MD [address] [facility] [place],  [place] [address]  Urological history: 1. High risk hematuria - former smoker (19JY) - 2016 - CT Urogram - nephrolithiasis, cysto with very large prostate - Cystoscopy with Dr. Secundino Dach in 09/2015 enlarged prostate - 11/2019 - CT Urogram - Bilateral nonobstructing renal stones.  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 Nephrolithiasis - Pre 2016 - medicla passage x several, URS x 1 - 08/2015 - medical passage left 6mm ureteral stone, LLP 1cm non-obstructing as well. No right sided stones - CT (2021) -bilateral nonobstructing renal stones - Composition Uric Acid  - managed with KCit 20 BID   3. BPH with LU TS - PSA 1.66 in 08/2020 - tamsulosin  0.4 mg daily and finasteride  5 mg daily   4. 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  Presented to the ED on 03/17/2019 and had Foley place for > 800cc of urine - He presented to the office on 03/27/2019 for a TOV and was instructed in CIC.  Failed 1 st TOV on 07/22 and had Foley placed for > 1000cc.   - Foley catheter was removed 04/09/2019 - return scan 110 mL  Chief Complaint  Patient presents with   Results    HPI: Stephen Cervantes is a 83 y.o. male who presents today for CT report with his SO, Stephen Cervantes.    Previous records reviewed.     CT urogram performed on May first 2025 identified prostatic megaly, bilateral nephrolithiasis and a 1.5 cm lesion in the left kidney suspicious for renal cell carcinoma.  Today he feels well.  Patient denies any modifying or aggravating factors.  Patient denies any recent UTI's, gross hematuria, dysuria or suprapubic/flank  pain.  Patient denies any fevers, chills, nausea or vomiting.     PMH: Past Medical History:  Diagnosis Date   Arthritis    Asthma    Bowel obstruction (HCC) 7829,5621   Depression    Diverticulitis 2006   Enlarged prostate    Gallstones 07/04/2014   History of gastrointestinal ulcer    History of kidney stones    HLD (hyperlipidemia)    HTN (hypertension)    Kidney stone    kidney stones   Neuritis or radiculitis due to rupture of lumbar intervertebral disc 05/29/2015   Thyroid disease     Surgical History: Past Surgical History:  Procedure Laterality Date   COLECTOMY  2006   Abdominal colectomy, ileostomy, Hartman procedure for perforation of cecum, sigmoid diverticulitis   HERNIA REPAIR  2008?   Arthurine Lass [facility]   ILEOSTOMY  2007   JOINT REPLACEMENT  2014   Hip Replacement [facility] Angelucci   KNEE SURGERY Right 2011   POSTERIOR LUMBAR FUSION 4 LEVEL N/A 03/13/2019   Procedure: POSTERIOR LUMBAR FUSION 4 LEVEL L1-5;  Surgeon: Jodeen Munch, MD;  Location: [facility] ORS;  Service: Neurosurgery;  Laterality: N/A;   TRIGGER FINGER RELEASE Right 05/05/2021   Procedure: RELEASE TRIGGER FINGER/A-1 PULLEY;  Surgeon: Elner Hahn, MD;  Location: [facility] ORS;  Service: Orthopedics;  Laterality: Right;    Home Medications:  Allergies as of 01/24/2024       Reactions   Morphine And  Codeine Other (See Comments)   Severe hallucinations   Tape Other (See Comments)   Silk Tape per patient "peels off my skin"        Medication List        Accurate as of Jan 24, 2024  4:50 PM. If you have any questions, ask your nurse or doctor.          atorvastatin  10 MG tablet Commonly known as: LIPITOR Take 10 mg by mouth daily.   celecoxib  200 MG capsule Commonly known as: CELEBREX  Take 1 capsule (200 mg total) by mouth daily.   ferrous sulfate 325 (65 FE) MG tablet Take 325 mg by mouth daily with breakfast.   finasteride  5 MG tablet Commonly known as: PROSCAR  TAKE  ONE TABLET EVERY DAY   levothyroxine  100 MCG tablet Commonly known as: SYNTHROID  Take 12.5 mcg by mouth daily before breakfast.   loperamide  2 MG capsule Commonly known as: IMODIUM  Take 4 mg by mouth in the morning, at noon, and at bedtime.   losartan 25 MG tablet Commonly known as: COZAAR Take 50 mg by mouth daily.   montelukast  10 MG tablet Commonly known as: SINGULAIR  Take 1 tablet by mouth daily.   Multi-Vitamin tablet Take 1 tablet by mouth daily.   pantoprazole  40 MG tablet Commonly known as: PROTONIX  Take 40 mg by mouth daily before breakfast.   potassium citrate  10 MEQ (1080 MG) SR tablet Commonly known as: UROCIT-K  Take 20 mEq by mouth 2 (two) times daily.   sucralfate  1 g tablet Commonly known as: CARAFATE  Take 1 g by mouth in the morning, at noon, and at bedtime.   tamsulosin  0.4 MG Caps capsule Commonly known as: FLOMAX  TAKE 1 CAPSULE BY MOUTH ONCE DAILY        Allergies:  Allergies  Allergen Reactions   Morphine And Codeine Other (See Comments)    Severe hallucinations   Tape Other (See Comments)    Silk Tape per patient "peels off my skin"    Family History: Family History  Problem Relation Age of Onset   Cancer Father        cancer of lung, brain  and lymphoma   Parkinson's disease Brother    Mesothelioma Brother    Kidney disease Neg Hx    Prostate cancer Neg Hx    Kidney cancer Neg Hx    Bladder Cancer Neg Hx     Social History:  reports that he quit smoking about 27 years ago. His smoking use included cigarettes. He has never used smokeless tobacco. He reports that he does not drink alcohol and does not use drugs.  ROS: For pertinent review of systems please refer to history of present illness  Physical Exam: BP (!) 146/78   Pulse 71   Ht 5\' 9"  (1.753 m)   Wt 175 lb (79.4 kg)   BMI 25.84 kg/m   Constitutional:  Well nourished. Alert and oriented, No acute distress. HEENT: [place] AT, moist mucus membranes.  Trachea  midline Cardiovascular: No clubbing, cyanosis, or edema. Respiratory: Normal respiratory effort, no increased work of breathing. Neurologic: Grossly intact, no focal deficits, moving all 4 extremities. Psychiatric: Normal mood and affect.   Laboratory Data: N/A   Pertinent Imaging Narrative & Impression  EXAM: CT UROGRAM 01/04/2024 09:57:05 AM   TECHNIQUE: CT of the abdomen and pelvis was performed before and after the administration of intravenous contrast as per CT urogram protocol. Multiplanar reformatted images as well as MIP urogram images are provided  for review. Automated exposure control, iterative reconstruction, and/or weight based adjustment of the mA/kV was utilized to reduce the radiation dose to as low as reasonably achievable.   COMPARISON: 12/02/2019   CLINICAL HISTORY: Microscopic hematuria. High risk hematuria; former smoker (20PY); 2016 - CT Urogram - nephrolithiasis, cysto with very large prostate; Cystoscopy with Dr. Secundino Dach in 09/2015 enlarged prostate; 11/2019 - CT Urogram - Bilateral nonobstructing renal stones. Single small cysts are noted in each kidney with numerous tiny hypodensities in the kidneys bilaterally, too small to characterize; cystoscopy 11/2019 - NED; Nephrolithiasis; Pre 2016 - medicla passage x several, URS x 1; 08/2015 - medical passage left 6mm ureteral stone, LLP 1cm non-obstructing as well. No right sided stones; CT (2021) -bilateral nonobstructing renal stones; Composition Uric Acid; managed with KCit 20 BID; BPH with LU TS; PSA 1.66 in 08/2020; tamsulosin  0.4 mg daily and finasteride  5 mg daily; Urinary retention; Experienced after back surgery. Presented to the ED on 03/17/2019 and had Foley place for > 800cc of urine; He presented to the office on 03/27/2019 for a TOV and was instructed in CIC. Failed 1st TOV on 07/22 and had Foley placed for > 1000cc; Foley catheter was removed 04/09/2019 - return scan 110 mL.   FINDINGS:    LOWER CHEST: Mild bibasilar scarring, likely atelectasis.   LIVER: The liver is unremarkable.   GALLBLADDER AND BILE DUCTS: Cholelithiasis, without associated inflammatory changes.   SPLEEN: No acute abnormality.   PANCREAS: No acute abnormality.   ADRENAL GLANDS: No acute abnormality.   KIDNEYS, URETERS AND BLADDER: Bilateral nonobstructing renal calculi, measuring up to 10 mm in the right upper pole (image 30) and 13 mm in the left lower pole (image 40). No ureteral or bladder calculi. No hydronephrosis. Scattered renal cortical scarring with simple bilateral renal cysts measuring up to 13 mm in the right upper kidney (image 35), benign (Bosniak 1). However, there is a 1.5 cm lesion in the anterior left upper kidney with enhancement medially following contrast administration (image 28), suggesting a small renal neoplasm such as renal cell carcinoma.   GI AND BOWEL: Stomach demonstrates no acute abnormality. There is no evidence of bowel obstruction. No evidence of appendicitis.   PERITONEUM AND RETROPERITONEUM: Postsurgical changes related to prior ventral hernia mesh repair. No evidence of ascites. No free air.   VASCULATURE: Atherosclerotic calcifications of the abdominal aorta and branch vessels, although patent.   LYMPH NODES: No evidence of lymphadenopathy.   REPRODUCTIVE ORGANS: Prostatomegaly, with enlargement of the central gland indenting the base of the bladder, suggesting BPH.   BONES AND SOFT TISSUES: Right hip arthroplasty. Lumbar spine fixation hardware. No acute osseous abnormality. No focal soft tissue abnormality.   IMPRESSION: 1. 1.5 cm enhancing lesion in the anterior left upper kidney, suggesting a small renal neoplasm such as renal cell carcinoma. 2. Bilateral nonobstructing renal calculi, measuring up to 10 mm in the right upper pole and 13 mm in the left lower pole. No ureteral or bladder calculi. No hydronephrosis. 3.  Prostatomegaly, suggesting BPH. 4. These results will be called to the ordering clinician or representative by the Radiologist Assistant, and communication documented in the PACS or [employer].   Electronically signed by: Zadie Herter MD 01/18/2024 12:37 AM EDT RP Workstation: [id number]  I have independently reviewed the films.  See HPI.      Assessment & Plan:   1. Left renal mass -A solid renal mass raises the suspicion of primary renal malignancy.  We discussed this in  detail and in regards to the spectrum of renal masses which includes cysts (pure cysts are considered benign), solid masses and everything in between. The risk of metastasis increases as the size of solid renal mass increases. In general, it is believed that the risk of metastasis for renal masses less than 3-4 cm is small (up to approximately 5%) based mainly on large retrospective studies -In some cases and especially in patients of older age and multiple comorbidities a surveillance approach may be appropriate. -The treatment of solid renal masses includes: surveillance, cryoablation (percutaneous and laparoscopic) in addition to partial and complete nephrectomy (each with option of laparoscopic, robotic and open depending on appropriateness). Furthermore, nephrectomy appears to be an independent risk factor for the development of chronic kidney disease suggesting that nephron sparing approaches should be implored whenever feasible. We reviewed these options in context of the patients current situation as well as the pros and cons of each. - citing his age, he would like to continue with active surveillance - repeat renal protocol CT in 6 months, we did discuss that if there is any notable increase in size of the lesion we may need to move towards the other treatment modalities and he is agreeable  2. Bilateral nephrolithiasis - continue Urocit-K  20 mEq BID - change his doses to one 20 mg in the am, one 20 mg in  the afternoon and two 20 mg qhs - asymptomatic at this time  3. BPH with LU TS - Continue conservative management, avoiding bladder irritants and timed voiding's - Continue the tamsulosin  0.4 mg daily and finasteride  5 mg daily  4. High risk hematuria - work up x 3  - Most recent workup completed in May 2025 which found a 1.5 cm left renal mass - see #1   Return in about 6 months (around 07/26/2024) for repeat CT, I PSS .  These notes generated with voice recognition software. I apologize for typographical errors.  Briant Camper  [facility] [address] [place], [place] [address] [phone]

## 2024-01-29 ENCOUNTER — Encounter: Payer: Self-pay | Admitting: Urology

## 2024-03-21 ENCOUNTER — Ambulatory Visit: Admitting: Urology

## 2024-03-22 ENCOUNTER — Other Ambulatory Visit: Payer: Self-pay | Admitting: Internal Medicine

## 2024-03-22 DIAGNOSIS — K439 Ventral hernia without obstruction or gangrene: Secondary | ICD-10-CM

## 2024-03-22 DIAGNOSIS — R222 Localized swelling, mass and lump, trunk: Secondary | ICD-10-CM

## 2024-03-24 ENCOUNTER — Emergency Department

## 2024-03-24 ENCOUNTER — Emergency Department
Admission: EM | Admit: 2024-03-24 | Discharge: 2024-03-24 | Disposition: A | Discharge status: Home / Self Care | Attending: Emergency Medicine | Admitting: Emergency Medicine

## 2024-03-24 ENCOUNTER — Other Ambulatory Visit: Payer: Self-pay

## 2024-03-24 DIAGNOSIS — R1012 Left upper quadrant pain: Secondary | ICD-10-CM | POA: Diagnosis present

## 2024-03-24 DIAGNOSIS — I10 Essential (primary) hypertension: Secondary | ICD-10-CM | POA: Diagnosis not present

## 2024-03-24 DIAGNOSIS — N179 Acute kidney failure, unspecified: Secondary | ICD-10-CM | POA: Insufficient documentation

## 2024-03-24 DIAGNOSIS — D72829 Elevated white blood cell count, unspecified: Secondary | ICD-10-CM | POA: Diagnosis not present

## 2024-03-24 DIAGNOSIS — Y658 Other specified misadventures during surgical and medical care: Secondary | ICD-10-CM | POA: Diagnosis not present

## 2024-03-24 DIAGNOSIS — R1013 Epigastric pain: Secondary | ICD-10-CM

## 2024-03-24 DIAGNOSIS — T83718A Erosion of other implanted mesh and other prosthetic materials to surrounding organ or tissue, initial encounter: Secondary | ICD-10-CM | POA: Insufficient documentation

## 2024-03-24 LAB — LIPASE, BLOOD: Lipase: 28 U/L (ref 11–51)

## 2024-03-24 LAB — COMPREHENSIVE METABOLIC PANEL WITH GFR
ALT: 30 U/L (ref 0–44)
AST: 28 U/L (ref 15–41)
Albumin: 3.3 g/dL — ABNORMAL LOW (ref 3.5–5.0)
Alkaline Phosphatase: 223 U/L — ABNORMAL HIGH (ref 38–126)
Anion gap: 8 (ref 5–15)
BUN: 19 mg/dL (ref 8–23)
CO2: 22 mmol/L (ref 22–32)
Calcium: 9.6 mg/dL (ref 8.9–10.3)
Chloride: 103 mmol/L (ref 98–111)
Creatinine, Ser: 1.35 mg/dL — ABNORMAL HIGH (ref 0.61–1.24)
GFR, Estimated: 52 mL/min — ABNORMAL LOW (ref >60)
Glucose, Bld: 123 mg/dL — ABNORMAL HIGH (ref 70–99)
Potassium: 4.6 mmol/L (ref 3.5–5.1)
Sodium: 133 mmol/L — ABNORMAL LOW (ref 135–145)
Total Bilirubin: 2.2 mg/dL — ABNORMAL HIGH (ref 0.0–1.2)
Total Protein: 7.3 g/dL (ref 6.5–8.1)

## 2024-03-24 LAB — BILIRUBIN, DIRECT: Bilirubin, Direct: 1 mg/dL — ABNORMAL HIGH (ref 0.0–0.2)

## 2024-03-24 LAB — CBC
HCT: 39.9 % (ref 39.0–52.0)
Hemoglobin: 13.4 g/dL (ref 13.0–17.0)
MCH: 29.6 pg (ref 26.0–34.0)
MCHC: 33.6 g/dL (ref 30.0–36.0)
MCV: 88.3 fL (ref 80.0–100.0)
Platelets: 217 K/uL (ref 150–400)
RBC: 4.52 MIL/uL (ref 4.22–5.81)
RDW: 12.9 % (ref 11.5–15.5)
WBC: 11.9 K/uL — ABNORMAL HIGH (ref 4.0–10.5)
nRBC: 0 % (ref 0.0–0.2)

## 2024-03-24 MED ORDER — LACTATED RINGERS IV BOLUS
1000.0000 mL | Freq: Once | INTRAVENOUS | Status: AC
  Administered 2024-03-24: 1000 mL via INTRAVENOUS

## 2024-03-24 MED ORDER — ONDANSETRON HCL 4 MG/2ML IJ SOLN
4.0000 mg | Freq: Once | INTRAMUSCULAR | Status: AC
  Administered 2024-03-24: 4 mg via INTRAVENOUS
  Filled 2024-03-24: qty 2

## 2024-03-24 MED ORDER — AMOXICILLIN-POT CLAVULANATE 875-125 MG PO TABS: 1.0000 | ORAL_TABLET | Freq: Two times a day (BID) | ORAL | 0 refills | Status: DC

## 2024-03-24 MED ORDER — HYDROMORPHONE HCL 1 MG/ML IJ SOLN
0.5000 mg | Freq: Once | INTRAMUSCULAR | Status: AC
  Administered 2024-03-24: 0.5 mg via INTRAVENOUS
  Filled 2024-03-24: qty 0.5

## 2024-03-24 MED ORDER — IOHEXOL 300 MG/ML  SOLN
100.0000 mL | Freq: Once | INTRAMUSCULAR | Status: AC | PRN
  Administered 2024-03-24: 100 mL via INTRAVENOUS

## 2024-03-24 MED ORDER — OXYCODONE HCL 5 MG PO TABS: 5.0000 mg | ORAL_TABLET | Freq: Three times a day (TID) | ORAL | 0 refills | Status: DC | PRN

## 2024-03-24 MED ORDER — AMOXICILLIN-POT CLAVULANATE 875-125 MG PO TABS
1.0000 | ORAL_TABLET | Freq: Once | ORAL | Status: AC
  Administered 2024-03-24: 1 via ORAL
  Filled 2024-03-24: qty 1

## 2024-03-24 MED ORDER — PANTOPRAZOLE SODIUM 40 MG PO TBEC: 40.0000 mg | DELAYED_RELEASE_TABLET | Freq: Two times a day (BID) | ORAL | 0 refills | Status: DC

## 2024-03-24 NOTE — ED Triage Notes (Signed)
 Pt arrives via POV with c/o upper abdominal pain that started on the LUQ and has since traveled across their entire upper abdomen that started a week ago. Pt was supposed to have a CT scan in the morning but doesn't think that they can make it till then. Pt is A&Ox4 and ambulatory during triage.

## 2024-03-24 NOTE — ED Provider Notes (Signed)
 [facility] Provider Note    Event Date/Time   First MD Initiated Contact with Patient 03/24/24 1112     (approximate)   History   Chief Complaint Abdominal Pain   HPI  RIDGELY ANASTACIO is a 83 y.o. male with past medical history of hypertension, kidney stones, PUD, and diverticulitis status post total colectomy who presents to the ED complaining of abdominal pain.  Patient reports that he has had 1 week of increasing pain that started in his left upper quadrant and has now spread across his entire upper abdomen.  He reports feeling a knot under his ribs on the left side and has occasionally felt nauseous, but has not had any vomiting.  He denies any fevers, dysuria, or flank pain.  He was scheduled to undergo CT imaging tomorrow morning, but states that he cannot wait until then with the worsening pain.  He has not had any pain moving up into his chest and denies any difficulty breathing.     Physical Exam   Triage Vital Signs: ED Triage Vitals  Encounter Vitals Group     BP 03/24/24 1049 131/85     Girls Systolic BP Percentile --      Girls Diastolic BP Percentile --      Boys Systolic BP Percentile --      Boys Diastolic BP Percentile --      Pulse Rate 03/24/24 1049 (!) 101     Resp 03/24/24 1049 16     Temp 03/24/24 1049 98 F (36.7 C)     Temp Source 03/24/24 1049 Oral     SpO2 03/24/24 1049 94 %     Weight 03/24/24 1052 180 lb (81.6 kg)     Height 03/24/24 1052 5' 9 (1.753 m)     Head Circumference --      Peak Flow --      Pain Score 03/24/24 1050 4     Pain Loc --      Pain Education --      Exclude from Growth Chart --     Most recent vital signs: Vitals:   03/24/24 1049  BP: 131/85  Pulse: (!) 101  Resp: 16  Temp: 98 F (36.7 C)  SpO2: 94%    Constitutional: Alert and oriented. Eyes: Conjunctivae are normal. Head: Atraumatic. Nose: No congestion/rhinnorhea. Mouth/Throat: Mucous membranes are moist.  Cardiovascular:  Normal rate, regular rhythm. Grossly normal heart sounds.  2+ radial pulses bilaterally. Respiratory: Normal respiratory effort.  No retractions. Lungs CTAB. Gastrointestinal: Soft and tender to palpation in the bilateral upper quadrants, greatest in the epigastrium. No distention. Musculoskeletal: No lower extremity tenderness nor edema.  Neurologic:  Normal speech and language. No gross focal neurologic deficits are appreciated.    ED Results / Procedures / Treatments   Labs (all labs ordered are listed, but only abnormal results are displayed) Labs Reviewed  COMPREHENSIVE METABOLIC PANEL WITH GFR - Abnormal; Notable for the following components:      Result Value   Sodium 133 (*)    Glucose, Bld 123 (*)    Creatinine, Ser 1.35 (*)    Albumin 3.3 (*)    Alkaline Phosphatase 223 (*)    Total Bilirubin 2.2 (*)    GFR, Estimated 52 (*)    All other components within normal limits  CBC - Abnormal; Notable for the following components:   WBC 11.9 (*)    All other components within normal limits  BILIRUBIN, DIRECT -  Abnormal; Notable for the following components:   Bilirubin, Direct 1.0 (*)    All other components within normal limits  LIPASE, BLOOD     EKG  ED ECG REPORT I, Carlin Palin, the attending physician, personally viewed and interpreted this ECG.   Date: 03/24/2024  EKG Time: 11:47  Rate: 92  Rhythm: normal sinus rhythm  Axis: LAD  Intervals:left anterior fascicular block  ST&T Change: None  RADIOLOGY CT abdomen/pelvis reviewed and interpreted by me with inflammatory changes around mesh with no free air or fluid collection.  PROCEDURES:  Critical Care performed: No  Procedures   MEDICATIONS ORDERED IN ED: Medications  amoxicillin -clavulanate (AUGMENTIN ) 875-125 MG per tablet 1 tablet (has no administration in time range)  HYDROmorphone  (DILAUDID ) injection 0.5 mg (0.5 mg Intravenous Given 03/24/24 1141)  ondansetron  (ZOFRAN ) injection 4 mg (4 mg  Intravenous Given 03/24/24 1141)  lactated ringers  bolus 1,000 mL (1,000 mLs Intravenous New Bag/Given 03/24/24 1141)  iohexol  (OMNIPAQUE ) 300 MG/ML solution 100 mL (100 mLs Intravenous Contrast Given 03/24/24 1208)     IMPRESSION / MDM / ASSESSMENT AND PLAN / ED COURSE  I reviewed the triage vital signs and the nursing notes.                              83 y.o. male with past medical history of hypertension, kidney stones, PUD, and diverticulitis status post total colectomy who presents to the ED complaining of increasing bilateral upper quadrant abdominal pain for the past 7 days.  Patient's presentation is most consistent with acute presentation with potential threat to life or bodily function.  Differential diagnosis includes, but is not limited to, gastritis, pancreatitis, hepatitis, cholecystitis, biliary colic, choledocholithiasis, malignancy, bowel obstruction, kidney stone.  Patient nontoxic-appearing and in no acute distress, vital signs remarkable for mild tachycardia but otherwise reassuring.  His abdomen is soft with bilateral upper quadrant tenderness, greatest in the epigastrium.  Labs show mild leukocytosis and mild AKI without acute electrolyte abnormality.  He does have elevation in his bilirubin at 2.2 as well as alkaline phosphatase of 223, although direct bilirubin is only 1.0.  We will further assess with CT imaging, hydrate with IV fluids and treat symptomatically with IV Dilaudid  and Zofran .  CT imaging without evidence of biliary obstruction, does show inflammatory changes around previously placed ventral hernia mesh, which also appears to have adhered to the stomach.  No evidence of free air or abscess formation.  Findings reviewed with Dr. Desiderio of general surgery, who recommends course of antibiotics but no need for surgical intervention at this time.  We will start patient on Augmentin  and PPI, general surgery follow-up to be scheduled for later this week.  Patient's  pain is much improved on reassessment and he is ambulatory without difficulty, appropriate for outpatient follow-up with general surgery.  He was counseled to return to the ED for new or worsening symptoms, patient agrees with plan.      FINAL CLINICAL IMPRESSION(S) / ED DIAGNOSES   Final diagnoses:  Epigastric pain  Erosion of other implanted mesh to organ or tissue, initial encounter [facility])     Rx / DC Orders   ED Discharge Orders          Ordered    oxyCODONE  (ROXICODONE ) 5 MG immediate release tablet  Every 8 hours PRN        03/24/24 1329    amoxicillin -clavulanate (AUGMENTIN ) 875-125 MG tablet  2 times daily  03/24/24 1329    pantoprazole  (PROTONIX ) 40 MG tablet  2 times daily before meals        03/24/24 1329             Note:  This document was prepared using Dragon voice recognition software and may include unintentional dictation errors.   [name], MD 03/24/24 [phone]

## 2024-03-25 ENCOUNTER — Ambulatory Visit: Admission: RE | Admit: 2024-03-25 | Source: Ambulatory Visit

## 2024-03-28 ENCOUNTER — Inpatient Hospital Stay: Admission: RE | Admit: 2024-03-28 | Payer: Self-pay | Source: Ambulatory Visit

## 2024-03-28 ENCOUNTER — Other Ambulatory Visit: Payer: Self-pay | Admitting: Family Medicine

## 2024-03-28 ENCOUNTER — Encounter: Payer: Self-pay | Admitting: Orthopedic Surgery

## 2024-03-28 DIAGNOSIS — Z049 Encounter for examination and observation for unspecified reason: Secondary | ICD-10-CM

## 2024-03-28 NOTE — Progress Notes (Signed)
 Referring Physician:  Fernande Ophelia [name], MD [address] [facility] [place],  [place] [address]  Primary Physician:  Fernande Ophelia [name], MD  History of Present Illness: 04/02/2024 Stephen Cervantes has a history of TIA, HTN, BPH, h/o duodenal ulcer, s/p colectomy.   He is s/p PSF L1-L5 by Dr. Clois on 03/13/19. Last seen on 07/05/22 and he was doing well.   He has intermittent LBP that is worse in the morning. He has improvement with hot shower and once he moves around. Pain gets worse again at night when he lays down to sleep. No leg pain. No numbness, tingling, or weakness. He has intermittent left hip pain as well.   He has seen some improvement over the last week.   He is taking celebrex  and prn tylenol .   Tobacco use: Does not smoke.   Bowel/Bladder Dysfunction: none  Conservative measures:  Physical therapy: has not participated in recently Multimodal medical therapy including regular antiinflammatories: Celebrex  and Oxycodone   Injections:  10/04/2018: Right L3-4 transforaminal ESI 06/26/2018: Right L3-4 transforaminal ESI (moderate to good relief) 10/20/2017: Right L3-4 transforaminal ESI (good relief) 06/15/2017: Right L3-4 transforaminal ESI 05/21/2017: Right L3-4 transforaminal ESI 06/26/2015: Right L3-4 transforaminal ESI (good relief) 05/29/2015: Right L3-4 transforaminal ES   Past Surgery:  03/13/2019-POSTERIOR LUMBAR FUSION 4 LEVEL   Review of Systems:  A 10 point review of systems is negative, except for the pertinent positives and negatives detailed in the HPI.  Past Medical History: Past Medical History:  Diagnosis Date   Arthritis    Asthma    Bowel obstruction (HCC) 7988,7984   Depression    Diverticulitis 2006   Enlarged prostate    Gallstones 07/04/2014   History of gastrointestinal ulcer    History of kidney stones    HLD (hyperlipidemia)    HTN (hypertension)    Kidney stone    kidney stones   Neuritis or radiculitis  due to rupture of lumbar intervertebral disc 05/29/2015   Thyroid disease     Past Surgical History: Past Surgical History:  Procedure Laterality Date   COLECTOMY  09/05/2004   Abdominal colectomy, ileostomy, Hartman procedure for perforation of cecum, sigmoid diverticulitis   HERNIA REPAIR  2008?   [facility]   ILEOSTOMY  09/05/2005   JOINT REPLACEMENT  09/05/2012   Hip Replacement [name]   KNEE SURGERY Right 09/05/2009   POSTERIOR LUMBAR FUSION 4 LEVEL N/A 03/13/2019   Procedure: POSTERIOR LUMBAR FUSION 4 LEVEL L1-5;  Surgeon: Clois Fret, MD;  Location: [facility] ORS;  Service: Neurosurgery;  Laterality: N/A;   TRIGGER FINGER RELEASE Right 05/05/2021   Procedure: RELEASE TRIGGER FINGER/A-1 PULLEY;  Surgeon: Edie Norleen JINNY, MD;  Location: [facility] ORS;  Service: Orthopedics;  Laterality: Right;    Allergies: Allergies as of 04/02/2024 - Review Complete 04/02/2024  Allergen Reaction Noted   Morphine and codeine Other (See Comments) 07/02/2014   Tape Other (See Comments) 07/02/2014    Medications: Outpatient Encounter Medications as of 04/02/2024  Medication Sig   amoxicillin -clavulanate (AUGMENTIN ) 875-125 MG tablet Take 1 tablet by mouth 2 (two) times daily for 7 days.   atorvastatin  (LIPITOR) 10 MG tablet Take 10 mg by mouth daily.    celecoxib  (CELEBREX ) 200 MG capsule Take 1 capsule (200 mg total) by mouth daily.   ferrous sulfate 325 (65 FE) MG tablet Take 325 mg by mouth daily with breakfast.   finasteride  (PROSCAR ) 5 MG tablet TAKE ONE TABLET EVERY  DAY   levothyroxine  (SYNTHROID ) 125 MCG tablet Take 125 mcg by mouth daily.   loperamide  (IMODIUM ) 2 MG capsule Take 6 mg by mouth in the morning, at noon, and at bedtime.   losartan (COZAAR) 50 MG tablet Take 50 mg by mouth daily.   montelukast  (SINGULAIR ) 10 MG tablet Take 1 tablet by mouth daily.   Multiple Vitamin (MULTI-VITAMIN) tablet Take 1 tablet by mouth daily.   pantoprazole  (PROTONIX ) 40  MG tablet Take 1 tablet (40 mg total) by mouth 2 (two) times daily before a meal for 14 days.   potassium citrate  (UROCIT-K ) 10 MEQ (1080 MG) SR tablet Take 10 mEq by mouth 4 (four) times daily.   sucralfate  (CARAFATE ) 1 g tablet Take 1 g by mouth in the morning, at noon, and at bedtime.   tamsulosin  (FLOMAX ) 0.4 MG CAPS capsule TAKE 1 CAPSULE BY MOUTH ONCE DAILY   [DISCONTINUED] amoxicillin -clavulanate (AUGMENTIN ) 875-125 MG tablet Take 1 tablet by mouth 2 (two) times daily for 7 days.   [DISCONTINUED] levothyroxine  (SYNTHROID ) 100 MCG tablet Take 12.5 mcg by mouth daily before breakfast.   [DISCONTINUED] losartan (COZAAR) 25 MG tablet Take 50 mg by mouth daily.   [DISCONTINUED] oxyCODONE  (ROXICODONE ) 5 MG immediate release tablet Take 1 tablet (5 mg total) by mouth every 8 (eight) hours as needed.   No facility-administered encounter medications on file as of 04/02/2024.    Social History: Social History   Tobacco Use   Smoking status: Former    Current packs/day: 0.00    Types: Cigarettes    Quit date: 1998    Years since quitting: 27.5   Smokeless tobacco: Never  Vaping Use   Vaping status: Never Used  Substance Use Topics   Alcohol use: No    Alcohol/week: 0.0 standard drinks of alcohol   Drug use: No    Family Medical History: Family History  Problem Relation Age of Onset   Cancer Father        cancer of lung, brain  and lymphoma   Parkinson's disease Brother    Mesothelioma Brother    Kidney disease Neg Hx    Prostate cancer Neg Hx    Kidney cancer Neg Hx    Bladder Cancer Neg Hx     Physical Examination: Vitals:   04/02/24 1107  BP: (!) 142/86      Awake, alert, oriented to person, place, and time.  Speech is clear and fluent. Fund of knowledge is appropriate.   Cranial Nerves: Pupils equal round and reactive to light.  Facial tone is symmetric.    Lumbar incisions well healed.   No abnormal lesions on exposed skin.   Strength: Side Iliopsoas Quads  Hamstring PF DF EHL  R 5 5 5 5 5 5   L 5 5 5 5 5 5    Reflexes are 2+ and symmetric at the patella and achilles.    Clonus is not present.   Bilateral lower extremity sensation is intact to light touch.     No pain with IR/ER of both hips.   Gait is normal.     Medical Decision Making  Imaging: No recent lumbar imaging.   Assessment and Plan: Stephen Cervantes is s/p PSF L1-L5 by Dr. Clois on 03/13/19.  He has intermittent LBP that is worse in the morning. He has improvement with hot shower and once he moves around. Pain gets worse again at night when he lays down to sleep. No leg pain. No numbness, tingling, or weakness. He  has seen some improvement over the last week.   Treatment options discussed with patient and following plan made:   - Lumbar xrays ordered.  - Continue on prn celebrex . Reviewed dosing and side effects.  - Will message him with xray results. Depending on results, may consider referral to PT and/or further imaging to consider lumbar injections.   I spent a total of 20 minutes in face-to-face and non-face-to-face activities related to this patient's care today including review of outside records, review of imaging, review of symptoms, physical exam, discussion of differential diagnosis, discussion of treatment options, and documentation.   Thank you for involving me in the care of this patient.   Glade Boys PA-C Dept. of Neurosurgery

## 2024-03-29 ENCOUNTER — Encounter: Payer: Self-pay | Admitting: Surgery

## 2024-03-29 ENCOUNTER — Other Ambulatory Visit
Admission: RE | Admit: 2024-03-29 | Discharge: 2024-03-29 | Disposition: A | Discharge status: Home / Self Care | Source: Ambulatory Visit | Attending: Surgery | Admitting: Surgery

## 2024-03-29 ENCOUNTER — Ambulatory Visit: Admitting: Surgery

## 2024-03-29 VITALS — BP 153/87 | HR 83 | Temp 98.2°F | Ht 69.0 in | Wt 179.0 lb

## 2024-03-29 DIAGNOSIS — R1010 Upper abdominal pain, unspecified: Secondary | ICD-10-CM

## 2024-03-29 DIAGNOSIS — Z8719 Personal history of other diseases of the digestive system: Secondary | ICD-10-CM

## 2024-03-29 LAB — COMPREHENSIVE METABOLIC PANEL WITH GFR
ALT: 50 U/L — ABNORMAL HIGH (ref 0–44)
AST: 50 U/L — ABNORMAL HIGH (ref 15–41)
Albumin: 3.1 g/dL — ABNORMAL LOW (ref 3.5–5.0)
Alkaline Phosphatase: 378 U/L — ABNORMAL HIGH (ref 38–126)
Anion gap: 10 (ref 5–15)
BUN: 25 mg/dL — ABNORMAL HIGH (ref 8–23)
CO2: 26 mmol/L (ref 22–32)
Calcium: 9.7 mg/dL (ref 8.9–10.3)
Chloride: 105 mmol/L (ref 98–111)
Creatinine, Ser: 1.52 mg/dL — ABNORMAL HIGH (ref 0.61–1.24)
GFR, Estimated: 45 mL/min — ABNORMAL LOW (ref >60)
Glucose, Bld: 118 mg/dL — ABNORMAL HIGH (ref 70–99)
Potassium: 4.3 mmol/L (ref 3.5–5.1)
Sodium: 141 mmol/L (ref 135–145)
Total Bilirubin: 0.6 mg/dL (ref 0.0–1.2)
Total Protein: 7.1 g/dL (ref 6.5–8.1)

## 2024-03-29 LAB — CBC
HCT: 40.8 % (ref 39.0–52.0)
Hemoglobin: 13 g/dL (ref 13.0–17.0)
MCH: 28.6 pg (ref 26.0–34.0)
MCHC: 31.9 g/dL (ref 30.0–36.0)
MCV: 89.7 fL (ref 80.0–100.0)
Platelets: 266 K/uL (ref 150–400)
RBC: 4.55 MIL/uL (ref 4.22–5.81)
RDW: 13.3 % (ref 11.5–15.5)
WBC: 9.1 K/uL (ref 4.0–10.5)
nRBC: 0 % (ref 0.0–0.2)

## 2024-03-29 MED ORDER — AMOXICILLIN-POT CLAVULANATE 875-125 MG PO TABS: 1.0000 | ORAL_TABLET | Freq: Two times a day (BID) | ORAL | 0 refills | Status: DC

## 2024-03-29 NOTE — Patient Instructions (Signed)
 We have scheduled you for a CT Scan of your Abdomen and Pelvis with contrast. This has been scheduled at [facility] on 04/15/2024 . Please arrive there by 12:30 pm . If you need to reschedule your Scan, you may do so by calling [phone]. Please let us  know if you reschedule your scan as we have to get authorization from your insurance for this.    Abdominal Pain, Adult   Many things can cause belly (abdominal) pain. In most cases, belly pain is not a serious problem and can be watched and treated at home. But in some cases, it can be serious. Your doctor will try to find the cause of your belly pain. Follow these instructions at home: Medicines Take over-the-counter and prescription medicines only as told by your doctor. Do not take medicines that help you poop (laxatives) unless told by your doctor. General instructions Watch your belly pain for any changes. Tell your doctor if the pain gets worse. Drink enough fluid to keep your pee (urine) pale yellow. Contact a doctor if: Your belly pain changes or gets worse. You have very bad cramping or bloating in your belly. You vomit. Your pain gets worse with meals, after eating, or with certain foods. You have trouble pooping or have watery poop for more than 2-3 days. You are not hungry, or you lose weight without trying. You have signs of not getting enough fluid or water (dehydration). These may include: Dark pee, very little pee, or no pee. Cracked lips or dry mouth. Feeling sleepy or weak. You have pain when you pee or poop. Your belly pain wakes you up at night. You have blood in your pee. You have a fever. Get help right away if: You cannot stop vomiting. Your pain is only in one part of your belly, like on the right side. You have bloody or black poop, or poop that looks like tar. You have trouble breathing. You have chest pain. These symptoms may be an emergency. Get help right away. Call 911. Do not wait to see  if the symptoms will go away. Do not drive yourself to the hospital. This information is not intended to replace advice given to you by your health care provider. Make sure you discuss any questions you have with your health care provider. Document Revised: 06/08/2022 Document Reviewed: 06/08/2022 Elsevier Patient Education  2024 [employer].

## 2024-03-29 NOTE — Progress Notes (Signed)
 03/29/2024  Reason for Visit:  Upper abdominal pain  History of Present Illness: Stephen Cervantes is a 83 y.o. male presenting for evaluation of upper abdominal pain.  The patient has a complex remote surgical history.  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  This was subsequently reversed and he also developed incisional hernias that have undergone repair at least twice.  He reports that after one hernia repair, the surgery team had to go back because of mesh that was dislodged causing a bowel obstruction.  Unfortunately I do not have any of those records available.  His last surgery that was probably in 2008 and he has a large piece of mesh that is visible on CT scan.  He had been doing well from the abdominal standpoint.  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  After discussing with Urology team, he opted for close monitoring given his age.  That CT scan did now show any issues with his prior hernia repair or mesh.  However, he reports recently he started having more upper abdominal pain.  He presented to the ED on 03/24/24 with pain for 1 week that started in left upper quadrant and then spreading more in the upper abdomen.  He was originally scheduled for outpatient CT on 03/25/24 that his PCP had ordered, but given the worsening pain, he had a CT scan in the ED.  This showed some inflammatory changes in the superior aspect of the mesh/hernia repair with concerns for a possible area of adhesion vs erosion on the gastric wall, without any evidence of fistula, perforation, or abscess.  There were also some inflammatory changes in bilateral lateral aspects of the mesh superiorly.  He was started on a course of Augmentin  and able to be discharged home.  Today he reports  that his pain is much improved.  He is tolerating a diet well without any nausea or vomiting.  He has multiple bowel movements a day given his subtotal colectomy.  He reports having some cold sweats this week, but denies any fevers or any worsening pain.  Past Medical History: Past Medical History:  Diagnosis Date   Arthritis    Asthma    Bowel obstruction (HCC) 7988,7984   Depression    Diverticulitis 2006   Enlarged prostate    Gallstones 07/04/2014   History of gastrointestinal ulcer    History of kidney stones    HLD (hyperlipidemia)    HTN (hypertension)    Kidney stone    kidney stones   Neuritis or radiculitis due to rupture of lumbar intervertebral disc 05/29/2015   Thyroid disease      Past Surgical History: Past Surgical History:  Procedure Laterality Date   COLECTOMY  09/05/2004   Abdominal colectomy, ileostomy, Hartman procedure for perforation of cecum, sigmoid diverticulitis   HERNIA REPAIR  2008?   [facility]   ILEOSTOMY  09/05/2005   JOINT REPLACEMENT  09/05/2012   Hip Replacement [name]   KNEE SURGERY Right 09/05/2009   POSTERIOR LUMBAR FUSION 4 LEVEL N/A 03/13/2019   Procedure: POSTERIOR LUMBAR FUSION 4 LEVEL L1-5;  Surgeon: Clois Fret, MD;  Location: [facility] ORS;  Service: Neurosurgery;  Laterality: N/A;   TRIGGER FINGER RELEASE Right 05/05/2021   Procedure:  RELEASE TRIGGER FINGER/A-1 PULLEY;  Surgeon: Edie Norleen PARAS, MD;  Location: [facility] ORS;  Service: Orthopedics;  Laterality: Right;    Home Medications: Prior to Admission medications   Medication Sig Start Date End Date Taking? Authorizing Provider  loperamide  (IMODIUM ) 2 MG capsule Take 4 mg by mouth in the morning, at noon, and at bedtime. Patient taking differently: Take 6 mg by mouth in the morning, at noon, and at bedtime.   Yes [provider]  amoxicillin -clavulanate (AUGMENTIN ) 875-125 MG tablet Take 1 tablet by mouth 2 (two) times daily for 7 days.  03/29/24 04/05/24  Desiderio Schanz, MD  atorvastatin  (LIPITOR) 10 MG tablet Take 10 mg by mouth daily.     [provider]  celecoxib  (CELEBREX ) 200 MG capsule Take 1 capsule (200 mg total) by mouth daily. 03/13/23   Hilma Hastings, PA-C  ferrous sulfate 325 (65 FE) MG tablet Take 325 mg by mouth daily with breakfast.    [provider]  finasteride  (PROSCAR ) 5 MG tablet TAKE ONE TABLET EVERY DAY 01/12/24   McGowan, Clotilda A, PA-C  levothyroxine  (SYNTHROID ) 100 MCG tablet Take 12.5 mcg by mouth daily before breakfast.    [provider]  losartan (COZAAR) 25 MG tablet Take 50 mg by mouth daily. 09/10/21   [provider]  montelukast  (SINGULAIR ) 10 MG tablet Take 1 tablet by mouth daily. 06/16/23   [provider]  Multiple Vitamin (MULTI-VITAMIN) tablet Take 1 tablet by mouth daily.    [provider]  oxyCODONE  (ROXICODONE ) 5 MG immediate release tablet Take 1 tablet (5 mg total) by mouth every 8 (eight) hours as needed. 03/24/24 03/24/25  [name], MD  pantoprazole  (PROTONIX ) 40 MG tablet Take 1 tablet (40 mg total) by mouth 2 (two) times daily before a meal for 14 days. 03/24/24 04/07/24  [name], MD  potassium citrate  (UROCIT-K ) 10 MEQ (1080 MG) SR tablet Take 20 mEq by mouth 2 (two) times daily.  04/04/19   [provider]  sucralfate  (CARAFATE ) 1 g tablet Take 1 g by mouth in the morning, at noon, and at bedtime.    [provider]  tamsulosin  (FLOMAX ) 0.4 MG CAPS capsule TAKE 1 CAPSULE BY MOUTH ONCE DAILY 01/12/24   Helon Clotilda A, PA-C    Allergies: Allergies  Allergen Reactions   Morphine And Codeine Other (See Comments)    Severe hallucinations   Tape Other (See Comments)    Silk Tape per patient peels off my skin    Social History:  reports that he quit smoking about 27 years ago. His smoking use included cigarettes. He has never used smokeless tobacco. He reports that he does not drink alcohol and does not  use drugs.   Family History: Family History  Problem Relation Age of Onset   Cancer Father        cancer of lung, brain  and lymphoma   Parkinson's disease Brother    Mesothelioma Brother    Kidney disease Neg Hx    Prostate cancer Neg Hx    Kidney cancer Neg Hx    Bladder Cancer Neg Hx     Review of Systems: Review of Systems  Constitutional:  Negative for chills and fever.  Respiratory:  Negative for shortness of breath.   Cardiovascular:  Negative for chest pain.  Gastrointestinal:  Negative for nausea.    Physical Exam BP (!) 153/87   Pulse 83   Temp 98.2 F (36.8 C) (Oral)   Ht 5' 9 (  1.753 m)   Wt 179 lb (81.2 kg)   SpO2 98%   BMI 26.43 kg/m  CONSTITUTIONAL: No acute distress, well nourished. HEENT:  Normocephalic, atraumatic, extraocular motion intact. NECK: Trachea is midline, and there is no jugular venous distension.  RESPIRATORY:  Lungs are clear, and breath sounds are equal bilaterally. Normal respiratory effort without pathologic use of accessory muscles. CARDIOVASCULAR: Heart is regular without murmurs, gallops, or rubs. GI: The abdomen is soft, non-distended, with minimal soreness to palpation in the upper abdomen.  He has a midline incision which is well healed, without evidence of recurrent hernia.  Umbilicus is previously excised.  MUSCULOSKELETAL:  Normal muscle strength and tone in all four extremities.  No peripheral edema or cyanosis. SKIN: Skin turgor is normal. There are no pathologic skin lesions.  NEUROLOGIC:  Motor and sensation is grossly normal.  Cranial nerves are grossly intact. PSYCH:  Alert and oriented to person, place and time. Affect is normal.  Laboratory Analysis: Labs from 03/24/24: Na 133, K 4.6, Cl 103, CO2 22, BUN 19, Cr 1.35.  Total bili 2.2, AST 28, ALT 30, Alk Phos 223, Albumin 3.3.  WBC 11.9, Hgb 13.4, Hct 39.9, Plt 217.  Imaging: CT abdomen/pelvis on 03/24/24: IMPRESSION: 1. Acute Inflammation along the cephalad extent of  previous Mesh Repair of Ventral Abdominal Hernia, and there appears to be Associated Ventral Gastric Wall Adhesion Or Erosion (see sagittal image 82). No pneumoperitoneum or fluid to suggest overt perforation at this time. But new inflammatory stranding along the cephalad and lateral margins of the mesh tracking caudal from that level.   2. No bowel obstruction but numerous decompressed small bowel loops are inseparable from the undersurface of the mesh.   3. No other acute or inflammatory process. Stable appearance of abnormal kidneys as detailed on 01/04/2024 CT urogram (please see that report).  Chronic cholelithiasis.   Aortic Atherosclerosis (ICD10-I70.0).  Assessment and Plan: This is a 83 y.o. male with upper abdominal pain  --Discussed with the patient the findings on his CT scan.  There are certainly some areas of inflammatory changes under the mesh, particularly towards the upper abdomen.  No evidence of perforation, abscess, or fistula.  However, it does raise the concern, particularly in the region of the stomach where it is close to the mesh, that there may be an area of erosion.  This was not really visualized on his CT from 01/04/24.  Discussed with the patient that it is very unusual to see mesh complications this far out from surgery, which would be at least 15 years ago.  However, this could be something very serious and we do need to follow closely.  It is otherwise unclear why there would be different areas of inflammation.  He is responding to oral antibiotic course. --As a precaution, will extend his course for another 7 days of Augmentin .  We will then repeat a CT scan of abdomen/pelvis 1 week after his last dose to check on any changes or progress of the inflammation.  If there is any worsening, we would have to consider the option for surgical intervention.  Discussed with him that if any surgery is needed, this would be a complex, tedious surgery with exploratory laparotomy,  lysis of adhesions, excision of mesh, most likely at least bowel repair vs resection.  At least on CT scan he no longer has a ventral hernia and the rectus muscles are in good position. --In the ED, he also had laboratory abnormalities with regards to  liver/gallbladder.  He has chronic cholelithiasis, but previous labs had not shown any issues.  As a precaution, will repeat his labs today.  He denies any RUQ pain issues. --Follow up with me after CT scan is done to review results and discuss further plans.  I spent 45 minutes dedicated to the care of this patient on the date of this encounter to include pre-visit review of records, face-to-face time with the patient discussing diagnosis and management, and any post-visit coordination of care.   Aloysius Sheree Plant, MD [facility]

## 2024-03-31 ENCOUNTER — Ambulatory Visit: Payer: Self-pay | Admitting: Surgery

## 2024-04-01 ENCOUNTER — Telehealth: Payer: Self-pay | Admitting: Surgery

## 2024-04-01 ENCOUNTER — Other Ambulatory Visit: Payer: Self-pay

## 2024-04-01 DIAGNOSIS — K8020 Calculus of gallbladder without cholecystitis without obstruction: Secondary | ICD-10-CM

## 2024-04-01 NOTE — Telephone Encounter (Signed)
 Pt has called you back please call him at [phone].

## 2024-04-02 ENCOUNTER — Ambulatory Visit
Admission: RE | Admit: 2024-04-02 | Discharge: 2024-04-02 | Disposition: A | Discharge status: Home / Self Care | Source: Ambulatory Visit | Attending: Orthopedic Surgery | Admitting: Orthopedic Surgery

## 2024-04-02 ENCOUNTER — Ambulatory Visit
Admission: RE | Admit: 2024-04-02 | Discharge: 2024-04-02 | Disposition: A | Discharge status: Home / Self Care | Attending: Orthopedic Surgery | Admitting: Orthopedic Surgery

## 2024-04-02 ENCOUNTER — Encounter: Payer: Self-pay | Admitting: Orthopedic Surgery

## 2024-04-02 ENCOUNTER — Ambulatory Visit (INDEPENDENT_AMBULATORY_CARE_PROVIDER_SITE_OTHER): Admitting: Orthopedic Surgery

## 2024-04-02 VITALS — BP 142/86 | Ht 69.0 in | Wt 178.0 lb

## 2024-04-02 DIAGNOSIS — Z981 Arthrodesis status: Secondary | ICD-10-CM | POA: Diagnosis present

## 2024-04-02 DIAGNOSIS — M545 Low back pain, unspecified: Secondary | ICD-10-CM | POA: Insufficient documentation

## 2024-04-05 ENCOUNTER — Ambulatory Visit
Admission: RE | Admit: 2024-04-05 | Discharge: 2024-04-05 | Disposition: A | Discharge status: Home / Self Care | Source: Ambulatory Visit | Attending: Surgery | Admitting: Surgery

## 2024-04-05 DIAGNOSIS — K8020 Calculus of gallbladder without cholecystitis without obstruction: Secondary | ICD-10-CM | POA: Insufficient documentation

## 2024-04-05 MED ORDER — GADOBUTROL 1 MMOL/ML IV SOLN
7.5000 mL | Freq: Once | INTRAVENOUS | Status: AC | PRN
  Administered 2024-04-05: 7.5 mL via INTRAVENOUS

## 2024-04-09 ENCOUNTER — Encounter: Payer: Self-pay | Admitting: Orthopedic Surgery

## 2024-04-09 NOTE — Telephone Encounter (Signed)
 Lumbar xrays dated 04/02/24:  FINDINGS: Status post surgical posterior fusion of L1-2, L2-3, L3-4 and L4-5 with bilateral intrapedicular screw placement and interbody fusion. No fracture or spondylolisthesis is noted. No change in vertebral body alignment is noted on flexion or extension views. 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 Possible left renal calculus is noted.   IMPRESSION: Stable postsurgical and degenerative changes as noted above. No acute abnormality seen in the lumbar spine. Possible left renal calculus.     Electronically Signed   By: Lynwood Landy Raddle M.D.   On: 04/09/2024 11:33  I have personally reviewed the images and agree with the above interpretation.  Patient sent message about his results.

## 2024-04-12 ENCOUNTER — Other Ambulatory Visit: Payer: Self-pay | Admitting: Surgery

## 2024-04-12 ENCOUNTER — Encounter: Payer: Self-pay | Admitting: Surgery

## 2024-04-12 MED ORDER — AMOXICILLIN-POT CLAVULANATE 875-125 MG PO TABS: 1.0000 | ORAL_TABLET | Freq: Two times a day (BID) | ORAL | 0 refills | Status: AC

## 2024-04-15 ENCOUNTER — Ambulatory Visit
Admission: RE | Admit: 2024-04-15 | Discharge: 2024-04-15 | Disposition: A | Discharge status: Home / Self Care | Source: Ambulatory Visit | Attending: Surgery | Admitting: Surgery

## 2024-04-15 DIAGNOSIS — R1010 Upper abdominal pain, unspecified: Secondary | ICD-10-CM | POA: Insufficient documentation

## 2024-04-15 MED ORDER — IOHEXOL 300 MG/ML  SOLN
100.0000 mL | Freq: Once | INTRAMUSCULAR | Status: AC | PRN
  Administered 2024-04-15 (×2): 100 mL via INTRAVENOUS

## 2024-04-19 ENCOUNTER — Ambulatory Visit (INDEPENDENT_AMBULATORY_CARE_PROVIDER_SITE_OTHER): Admitting: Surgery

## 2024-04-19 ENCOUNTER — Encounter: Payer: Self-pay | Admitting: Surgery

## 2024-04-19 VITALS — BP 149/86 | HR 76 | Temp 98.1°F | Ht 69.0 in | Wt 178.2 lb

## 2024-04-19 DIAGNOSIS — K439 Ventral hernia without obstruction or gangrene: Secondary | ICD-10-CM

## 2024-04-19 DIAGNOSIS — R1010 Upper abdominal pain, unspecified: Secondary | ICD-10-CM

## 2024-04-19 DIAGNOSIS — Z8719 Personal history of other diseases of the digestive system: Secondary | ICD-10-CM | POA: Diagnosis not present

## 2024-04-19 NOTE — Progress Notes (Signed)
 04/19/2024  History of Present Illness: Stephen Cervantes is a 83 y.o. male presenting for follow up of upper abdominal pain and inflammatory changes noted on prior CT scan.  He has extensive surgical history and currently has a ventral hernia repair mesh.  He was last seen on 03/29/24 and at the time was improving with the antibiotic course given by the ED.  I extended his course on that visit and also gave him an additional week which ends today.  He had a repeat CT scan on 04/15/24 which shows that the inflammatory changes are much improved.  The main areas of concern where in the LUQ and RUQ.  In the LUQ, there is an area of mesh that appears adhered to the anterior stomach wall.  Initially there was some fluid and stranding in the area, but now that is much improved.  In the RUQ, there was some inflammation of the fatty tissue adhered to the mesh, but this is also much improved.  His pain is better today.  He has noted a new area of pain in the left lower quadrant.    Of note, he does have a history of peptic ulcer disease.  He was given protonix  in the ED which he's currently taking.  Past Medical History: Past Medical History:  Diagnosis Date   Arthritis    Asthma    Bowel obstruction (HCC) 7988,7984   Depression    Diverticulitis 2006   Enlarged prostate    Gallstones 07/04/2014   History of gastrointestinal ulcer    History of kidney stones    HLD (hyperlipidemia)    HTN (hypertension)    Kidney stone    kidney stones   Neuritis or radiculitis due to rupture of lumbar intervertebral disc 05/29/2015   Thyroid disease      Past Surgical History: Past Surgical History:  Procedure Laterality Date   COLECTOMY  09/05/2004   Abdominal colectomy, ileostomy, Hartman procedure for perforation of cecum, sigmoid diverticulitis   HERNIA REPAIR  2008?   [facility]   ILEOSTOMY  09/05/2005   JOINT REPLACEMENT  09/05/2012   Hip Replacement [name]   KNEE SURGERY  Right 09/05/2009   POSTERIOR LUMBAR FUSION 4 LEVEL N/A 03/13/2019   Procedure: POSTERIOR LUMBAR FUSION 4 LEVEL L1-5;  Surgeon: Clois Fret, MD;  Location: [facility] ORS;  Service: Neurosurgery;  Laterality: N/A;   TRIGGER FINGER RELEASE Right 05/05/2021   Procedure: RELEASE TRIGGER FINGER/A-1 PULLEY;  Surgeon: Edie Norleen PARAS, MD;  Location: [facility] ORS;  Service: Orthopedics;  Laterality: Right;    Home Medications: Prior to Admission medications   Medication Sig Start Date End Date Taking? Authorizing Provider  amoxicillin -clavulanate (AUGMENTIN ) 875-125 MG tablet Take 1 tablet by mouth 2 (two) times daily for 7 days. 04/12/24 04/19/24 Yes Capria Cartaya, Aloysius, MD  atorvastatin  (LIPITOR) 10 MG tablet Take 10 mg by mouth daily.    Yes [provider]  celecoxib  (CELEBREX ) 200 MG capsule Take 1 capsule (200 mg total) by mouth daily. 03/13/23  Yes Hilma Hastings, PA-C  ferrous sulfate 325 (65 FE) MG tablet Take 325 mg by mouth daily with breakfast.   Yes [provider]  finasteride  (PROSCAR ) 5 MG tablet TAKE ONE TABLET EVERY DAY 01/12/24  Yes McGowan, Clotilda A, PA-C  levothyroxine  (SYNTHROID ) 125 MCG tablet Take 125 mcg by mouth daily. 03/05/24  Yes [provider]  loperamide  (IMODIUM ) 2 MG capsule Take 6 mg by mouth in the morning, at noon, and at  bedtime.   Yes [provider]  losartan (COZAAR) 50 MG tablet Take 50 mg by mouth daily. 03/05/24  Yes [provider]  montelukast  (SINGULAIR ) 10 MG tablet Take 1 tablet by mouth daily. 06/16/23  Yes [provider]  Multiple Vitamin (MULTI-VITAMIN) tablet Take 1 tablet by mouth daily.   Yes [provider]  pantoprazole  (PROTONIX ) 40 MG tablet Take 1 tablet (40 mg total) by mouth 2 (two) times daily before a meal for 14 days. 03/24/24 04/19/24 Yes [name], MD  potassium citrate  (UROCIT-K ) 10 MEQ (1080 MG) SR tablet Take 10 mEq by mouth 4 (four) times daily. 04/04/19  Yes [provider]   sucralfate  (CARAFATE ) 1 g tablet Take 1 g by mouth in the morning, at noon, and at bedtime.   Yes [provider]  tamsulosin  (FLOMAX ) 0.4 MG CAPS capsule TAKE 1 CAPSULE BY MOUTH ONCE DAILY 01/12/24  Yes McGowan, Clotilda A, PA-C    Allergies: Allergies  Allergen Reactions   Morphine And Codeine Other (See Comments)    Severe hallucinations   Tape Other (See Comments)    Silk Tape per patient peels off my skin    Review of Systems: Review of Systems  Constitutional:  Negative for chills and fever.  Respiratory:  Negative for shortness of breath.   Cardiovascular:  Negative for chest pain.  Gastrointestinal:  Positive for abdominal pain. Negative for nausea and vomiting.    Physical Exam BP (!) 149/86   Pulse 76   Temp 98.1 F (36.7 C) (Oral)   Ht 5' 9 (1.753 m)   Wt 178 lb 3.2 oz (80.8 kg)   SpO2 95%   BMI 26.32 kg/m  CONSTITUTIONAL: No acute distress HEENT:  Normocephalic, atraumatic, extraocular motion intact. RESPIRATORY:  Normal respiratory effort without pathologic use of accessory muscles. CARDIOVASCULAR: Regular rhythm and rate. GI: The abdomen is soft, non-distended, currently non-tender but with some soreness in LUQ at the prior site of pain.  In the LLQ, the area of discomfort is in the lower aspect of the rectus muscle just superior to the groin area.  This feels tighter compared to the right side.  No inguinal hernia on the right side. NEUROLOGIC:  Motor and sensation is grossly normal.  Cranial nerves are grossly intact. PSYCH:  Alert and oriented to person, place and time. Affect is normal.  Labs/Imaging: CT abdomen/pelvis on 04/15/24: IMPRESSION: 1. There are postsurgical changes from midline epigastric/umbilical hernia repair with hernia mesh. There is no focal defect in the hernia mesh. There is some residual soft tissue immediately superior to the hernia mesh in the left paramedian epigastric region. However, there is interval resolution of  fluid seen on the prior exam. The fat planes between the tissue and anterior wall of stomach body are lost however, no discrete fistulous tract seen. Underlying adhesion is more likely. No air in the perigastric soft tissue to suggest contained perforation. 2. There is a 1.3 x 1.6 cm partially exophytic lesion arising from the left kidney upper pole, laterally which exhibits small hyperattenuating mural nodule along the right side measuring up to 4 x 8 mm. This is concerning for neoplastic process. No significant interval change since the prior study. 3. Multiple other nonacute observations, as described above.   Aortic Atherosclerosis (ICD10-I70.0).  Assessment and Plan: This is a 83 y.o. male with ventral hernia repair, with possible adhesions/erosion of mesh.  --Discussed with the patient that the inflammatory changes noted on his prior CT scan are much  improved today.  He also reports that his pain in the upper abdomen is also much improved.  Discussed with him that the area in the left lower abdomen feels like a tight muscle, so he might have pulled a muscle there.  Advised to restrict some of his activity level to allow this area to improve. --He does have a history of peptic ulcers, and given the concerns on his prior CT scan, I think it would be appropriate to do a referral to GI for EGD to further evaluate for any possible etiology for the CT scan changes. --Discussed with him that ideally would be good to avoid surgery if possible.  My main concern is related to the mesh and adhesions that it will have to the small bowel.  Discussed with him that it would very likely to have small bowel injuries that can be repaired while trying to separate bowel from mesh, and with this, there's a risk of complications such as missed injuries and fistula formation. --Patient will follow up with me after EGD is done to discuss results and any surgical needs.  Also discussed with him that if the pain is  worsening again, or more changes on any future imaging, we would have to resort to surgical management.  I spent 30 minutes dedicated to the care of this patient on the date of this encounter to include pre-visit review of records, face-to-face time with the patient discussing diagnosis and management, and any post-visit coordination of care.   Aloysius Sheree Plant, MD [facility]

## 2024-04-19 NOTE — Patient Instructions (Signed)
 Belly Hernia (Ventral Hernia): What to Know  A ventral hernia is a bulge of tissue from inside the belly that pushes through a weak area of the belly. Sometimes, the bulge may have tissue from the small intestine or the large intestine. Ventral hernias do not go away without surgery. There are several types of ventral hernias. You may have: A hernia at a place where surgery was done (incisional hernia). A hernia just above the belly button (epigastric or paraumbilical hernia) or at the belly button (umbilical hernia). These can happen because of heavy lifting or straining. A hernia that comes and goes (reducible hernia). It may be visible only when you lift or strain. This type of hernia can be pushed back into the belly. A hernia that traps belly tissue inside the hernia (incarcerated hernia). This hernia cannot be pushed back into the belly. A hernia that cuts off blood flow to the tissues inside the hernia (strangulation hernia). The tissues can start to die if this happens. This is a very painful bulge that cannot be pushed back into the belly. This type of hernia is a medical emergency. What are the causes? This condition happens when tissue in the belly pushes on a weak area in the muscles. What increases the risk? You're more likely to have this condition if: You are 60 years or older. You had belly surgery in the past. This is common if there was an infection after surgery. You had an injury to the belly. You often lift or push heavy objects. You have been pregnant several times. You have long-term (chronic) health conditions that put pressure in your belly. These include: Being overweight or obese. Having a buildup of fluid inside your belly (ascites). You throw up or cough over and over again. You have trouble pooping (constipation). You strain to poop or pee. What are the signs or symptoms? The only symptom of a ventral hernia may be a painless bulge in the belly.  Reducible  hernia may be visible only when you strain, cough, or lift. Other symptoms may include: Dull pain. A feeling of pressure. Symptoms of an incarcerated hernia may include: Tenderness at hernia site. Bloating. Throwing up or feeling like you may throw up. Trouble pooping or no pooping at all. Symptoms of a strangulated hernia may include: More pain. Throwing up or feeling like you may throw up. Pain when pressing on the hernia. The skin over the hernia turning red or purple. Trouble pooping. Blood in the poop. How is this diagnosed? This condition may be diagnosed based on: Your symptoms and medical history. A physical exam. You may be asked to cough or strain while standing. These actions increase the pressure inside your belly and force the hernia through the opening in your belly. Your health care provider may try to reduce the hernia by gently pushing the hernia back in. Imaging studies, such as an ultrasound or CT scan. How is this treated? This condition is treated with surgery. If you have a strangulated hernia, surgery is done as soon as possible. If your hernia is small and not incarcerated, you may be asked to lose some weight before surgery. Follow these instructions at home: Eat and drink only as you've been told. Lose weight, if told by your provider. You may have to avoid lifting. Ask your provider how much you can safely lift. Avoid activities that increase pressure on your hernia. Take your medicines only as told. You may need to take steps to help treat  or prevent trouble pooping (constipation), such as: Taking medicines to help you poop. Eating foods high in fiber, like beans, whole grains, and fresh fruits and vegetables. Drinking more fluids as told. Ask your provider if it's safe to drive or use machines while taking your medicine. Contact a health care provider if: Your hernia gets larger or feels hard. Your hernia becomes painful. Get help right away if: Your  hernia becomes very painful. You have pain along with any of these: Changes in skin color in the area of the hernia. Feeling like throwing up. Throwing up. Fever. These symptoms may be an emergency. Call 911 right away. Do not wait to see if the symptoms will go away. Do not drive yourself to the hospital. This information is not intended to replace advice given to you by your health care provider. Make sure you discuss any questions you have with your health care provider. Document Revised: 02/28/2023 Document Reviewed: 02/28/2023 Elsevier Patient Education  2024 [employer].

## 2024-04-23 ENCOUNTER — Telehealth: Payer: Self-pay

## 2024-04-23 NOTE — Telephone Encounter (Signed)
 Faxed referral to Dr. Baldomero Bone at [phone].

## 2024-04-30 ENCOUNTER — Encounter: Payer: Self-pay | Admitting: Surgery

## 2024-05-30 ENCOUNTER — Ambulatory Visit: Payer: Self-pay | Admitting: Anesthesiology

## 2024-05-30 ENCOUNTER — Ambulatory Visit
Admission: RE | Admit: 2024-05-30 | Discharge: 2024-05-30 | Disposition: A | Discharge status: Home / Self Care | Attending: Gastroenterology | Admitting: Gastroenterology

## 2024-05-30 ENCOUNTER — Encounter
Admission: RE | Disposition: A | Discharge status: Home / Self Care | Payer: Self-pay | Source: Home / Self Care | Attending: Gastroenterology

## 2024-05-30 ENCOUNTER — Other Ambulatory Visit: Payer: Self-pay

## 2024-05-30 ENCOUNTER — Encounter: Payer: Self-pay | Admitting: Gastroenterology

## 2024-05-30 DIAGNOSIS — K3189 Other diseases of stomach and duodenum: Secondary | ICD-10-CM | POA: Insufficient documentation

## 2024-05-30 DIAGNOSIS — Z791 Long term (current) use of non-steroidal anti-inflammatories (NSAID): Secondary | ICD-10-CM | POA: Insufficient documentation

## 2024-05-30 DIAGNOSIS — E785 Hyperlipidemia, unspecified: Secondary | ICD-10-CM | POA: Insufficient documentation

## 2024-05-30 DIAGNOSIS — I10 Essential (primary) hypertension: Secondary | ICD-10-CM | POA: Diagnosis not present

## 2024-05-30 DIAGNOSIS — Z79899 Other long term (current) drug therapy: Secondary | ICD-10-CM | POA: Diagnosis not present

## 2024-05-30 DIAGNOSIS — J45909 Unspecified asthma, uncomplicated: Secondary | ICD-10-CM | POA: Insufficient documentation

## 2024-05-30 DIAGNOSIS — Z79890 Hormone replacement therapy (postmenopausal): Secondary | ICD-10-CM | POA: Diagnosis not present

## 2024-05-30 DIAGNOSIS — K2950 Unspecified chronic gastritis without bleeding: Secondary | ICD-10-CM | POA: Diagnosis not present

## 2024-05-30 DIAGNOSIS — Z87891 Personal history of nicotine dependence: Secondary | ICD-10-CM | POA: Diagnosis not present

## 2024-05-30 DIAGNOSIS — R933 Abnormal findings on diagnostic imaging of other parts of digestive tract: Secondary | ICD-10-CM | POA: Diagnosis present

## 2024-05-30 HISTORY — PX: ESOPHAGOGASTRODUODENOSCOPY: SHX5428

## 2024-05-30 SURGERY — EGD (ESOPHAGOGASTRODUODENOSCOPY)
Anesthesia: General

## 2024-05-30 MED ORDER — PROPOFOL 1000 MG/100ML IV EMUL
INTRAVENOUS | Status: AC
  Filled 2024-05-30: qty 100

## 2024-05-30 MED ORDER — LIDOCAINE HCL (PF) 2 % IJ SOLN
INTRAMUSCULAR | Status: AC
  Filled 2024-05-30: qty 5

## 2024-05-30 MED ORDER — FENTANYL CITRATE (PF) 100 MCG/2ML IJ SOLN
INTRAMUSCULAR | Status: DC | PRN
  Administered 2024-05-30: 25 ug via INTRAVENOUS

## 2024-05-30 MED ORDER — SODIUM CHLORIDE 0.9 % IV SOLN
INTRAVENOUS | Status: DC
Start: 2024-05-30 — End: 2024-05-30

## 2024-05-30 MED ORDER — LIDOCAINE HCL (CARDIAC) PF 100 MG/5ML IV SOSY
PREFILLED_SYRINGE | INTRAVENOUS | Status: DC | PRN
  Administered 2024-05-30: 100 mg via INTRAVENOUS

## 2024-05-30 MED ORDER — FENTANYL CITRATE (PF) 100 MCG/2ML IJ SOLN
INTRAMUSCULAR | Status: AC
  Filled 2024-05-30: qty 2

## 2024-05-30 NOTE — Anesthesia Postprocedure Evaluation (Signed)
 Anesthesia Post Note  Patient: Stephen Cervantes  Procedure(s) Performed: EGD (ESOPHAGOGASTRODUODENOSCOPY)  Patient location during evaluation: Endoscopy Anesthesia Type: General Level of consciousness: awake and alert Pain management: pain level controlled Vital Signs Assessment: post-procedure vital signs reviewed and stable Respiratory status: spontaneous breathing, nonlabored ventilation and respiratory function stable Cardiovascular status: blood pressure returned to baseline and stable Postop Assessment: no apparent nausea or vomiting Anesthetic complications: no   No notable events documented.   Last Vitals:  Vitals:   05/30/24 1006 05/30/24 1021  BP: 112/76 132/71  Pulse: 66 62  Resp: 13 16  Temp: (!) 35.9 C   SpO2: 97% 100%    Last Pain:  Vitals:   05/30/24 1021  TempSrc:   PainSc: 0-No pain                 Fairy POUR Alese Furniss

## 2024-05-30 NOTE — H&P (Signed)
 Stephen JONELLE Brooklyn, MD [facility], [facility] [address]  [place], [place] [address]  Main: [phone] Fax:  [phone] Pager: [phone]   Primary Care Physician:  Stephen Ophelia Cervantes DOUGLAS, MD Primary Gastroenterologist:  Dr. Corinn JONELLE Cervantes  Pre-Procedure History & Physical: HPI:  Stephen Cervantes is Cervantes 83 y.o. male is here for an endoscopy.   Past Medical History:  Diagnosis Date   Arthritis    Asthma    Bowel obstruction (HCC) 7988,7984   Depression    Diverticulitis 2006   Enlarged prostate    Gallstones 07/04/2014   History of gastrointestinal ulcer    History of kidney stones    HLD (hyperlipidemia)    HTN (hypertension)    Kidney stone    kidney stones   Neuritis or radiculitis due to rupture of lumbar intervertebral disc 05/29/2015   Thyroid disease     Past Surgical History:  Procedure Laterality Date   COLECTOMY  09/05/2004   Abdominal colectomy, ileostomy, Hartman procedure for perforation of cecum, sigmoid diverticulitis   HERNIA REPAIR  2008?   [facility]   ILEOSTOMY  09/05/2005   JOINT REPLACEMENT  09/05/2012   Hip Replacement Stephen Cervantes   KNEE SURGERY Right 09/05/2009   POSTERIOR LUMBAR FUSION 4 LEVEL N/Cervantes 03/13/2019   Procedure: POSTERIOR LUMBAR FUSION 4 LEVEL L1-5;  Surgeon: Stephen Fret, MD;  Location: [facility] ORS;  Service: Neurosurgery;  Laterality: N/Cervantes;   TRIGGER FINGER RELEASE Right 05/05/2021   Procedure: RELEASE TRIGGER FINGER/Cervantes-1 PULLEY;  Surgeon: Stephen Norleen JINNY, MD;  Location: [facility] ORS;  Service: Orthopedics;  Laterality: Right;    Prior to Admission medications   Medication Sig Start Date End Date Taking? Authorizing Provider  atorvastatin  (LIPITOR) 10 MG tablet Take 10 mg by mouth daily.    Yes [provider]  celecoxib  (CELEBREX ) 200 MG capsule Take 1 capsule (200 mg total) by mouth daily. 03/13/23  Yes Stephen Hastings, PA-C  ferrous sulfate 325 (65 FE) MG tablet Take 325 mg by mouth  daily with breakfast.   Yes [provider]  finasteride  (PROSCAR ) 5 MG tablet TAKE ONE TABLET EVERY DAY 01/12/24  Yes Stephen Cervantes, Stephen A, PA-C  levothyroxine  (SYNTHROID ) 125 MCG tablet Take 125 mcg by mouth daily. 03/05/24  Yes [provider]  loperamide  (IMODIUM ) 2 MG capsule Take 6 mg by mouth in the morning, at noon, and at bedtime.   Yes [provider]  losartan (COZAAR) 50 MG tablet Take 50 mg by mouth daily. 03/05/24  Yes [provider]  montelukast  (SINGULAIR ) 10 MG tablet Take 1 tablet by mouth daily. 06/16/23  Yes [provider]  Multiple Vitamin (MULTI-VITAMIN) tablet Take 1 tablet by mouth daily.   Yes [provider]  pantoprazole  (PROTONIX ) 40 MG tablet Take 1 tablet (40 mg total) by mouth 2 (two) times daily before Cervantes meal for 14 days. 03/24/24 05/30/24 Yes Stephen Dunnings, MD  potassium citrate  (UROCIT-K ) 10 MEQ (1080 MG) SR tablet Take 10 mEq by mouth 4 (four) times daily. 04/04/19  Yes [provider]  sucralfate  (CARAFATE ) 1 g tablet Take 1 g by mouth in the morning, at noon, and at bedtime.   Yes [provider]  tamsulosin  (FLOMAX ) 0.4 MG CAPS capsule TAKE 1 CAPSULE BY MOUTH ONCE DAILY 01/12/24  Yes Stephen Cervantes, Stephen A, PA-C    Allergies as of 05/21/2024 - Review Complete 04/19/2024  Allergen Reaction Noted   Morphine and codeine Other (See Comments)  07/02/2014   Tape Other (See Comments) 07/02/2014    Family History  Problem Relation Age of Onset   Cancer Father        cancer of lung, brain  and lymphoma   Parkinson's disease Brother    Mesothelioma Brother    Kidney disease Neg Hx    Prostate cancer Neg Hx    Kidney cancer Neg Hx    Bladder Cancer Neg Hx     Social History   Socioeconomic History   Marital status: Single    Spouse name: Not on file   Number of children: Not on file   Years of education: Not on file   Highest education level: Not on file  Occupational History   Not on file   Tobacco Use   Smoking status: Former    Current packs/day: 0.00    Types: Cigarettes    Quit date: 1998    Years since quitting: 27.7   Smokeless tobacco: Never  Vaping Use   Vaping status: Never Used  Substance and Sexual Activity   Alcohol use: No    Alcohol/week: 0.0 standard drinks of alcohol   Drug use: No   Sexual activity: Not on file  Other Topics Concern   Not on file  Social History Narrative   Lives with GF Stephen Cervantes.          ** Merged History Encounter **    Social Drivers of Health   Financial Resource Strain: Low Risk  (05/15/2024)   Received from [facility]   Overall Financial Resource Strain (CARDIA)    Difficulty of Paying Living Expenses: Not hard at all  Food Insecurity: No Food Insecurity (05/15/2024)   Received from [facility]   Hunger Vital Sign    Within the past 12 months, you worried that your food would run out before you got the money to buy more.: Never true    Within the past 12 months, the food you bought just didn't last and you didn't have money to get more.: Never true  Transportation Needs: No Transportation Needs (05/15/2024)   Received from [facility] - Transportation    In the past 12 months, has lack of transportation kept you from medical appointments or from getting medications?: No    Lack of Transportation (Non-Medical): No  Physical Activity: Not on file  Stress: Not on file  Social Connections: Not on file  Intimate Partner Violence: Not on file    Review of Systems: See HPI, otherwise negative ROS  Physical Exam: BP (!) 146/78   Pulse 74   Temp (!) 97.4 F (36.3 C) (Temporal)   Wt 79.4 kg   SpO2 100%   BMI 25.84 kg/m  General:   Alert,  pleasant and cooperative in NAD Head:  Normocephalic and atraumatic. Neck:  Supple; no masses or thyromegaly. Lungs:  Clear throughout to auscultation.    Heart:  Regular rate and rhythm. Abdomen:  Soft,  nontender and nondistended. Normal bowel sounds, without guarding, and without rebound.   Neurologic:  Alert and  oriented x4;  grossly normal neurologically.  Impression/Plan: Stephen Cervantes is here for an endoscopy to be performed for CT abnormality of gastric wall   Risks, benefits, limitations, and alternatives regarding  colonoscopy have been reviewed with the patient.  Questions have been answered.  All parties agreeable.   Stephen Brooklyn, MD  05/30/2024, 9:12 AM

## 2024-05-30 NOTE — Transfer of Care (Signed)
 Immediate Anesthesia Transfer of Care Note  Patient: Stephen Cervantes  Procedure(s) Performed: EGD (ESOPHAGOGASTRODUODENOSCOPY)  Patient Location: PACU  Anesthesia Type:MAC  Level of Consciousness: awake, alert , and oriented  Airway & Oxygen Therapy: Patient Spontanous Breathing  Post-op Assessment: Report given to RN and Post -op Vital signs reviewed and stable  Post vital signs: stable  Last Vitals:  Vitals Value Taken Time  BP 112/76 05/30/24 10:06  Temp 35.9 C 05/30/24 10:06  Pulse 64 05/30/24 10:06  Resp 15 05/30/24 10:06  SpO2 98 % 05/30/24 10:06  Vitals shown include unfiled device data.  Last Pain:  Vitals:   05/30/24 1006  TempSrc: Temporal  PainSc: 0-No pain         Complications: No notable events documented.

## 2024-05-30 NOTE — Op Note (Signed)
 [facility] Patient Name: Stephen Cervantes Procedure Date: 05/30/2024 9:43 AM MRN: [id number] Account #: [id number] Date of Birth: June 06, 1941 Admit Type: Outpatient Age: 83 Room: [facility] ENDO ROOM 4 Gender: Male Note Status: Finalized Instrument Name: Upper GI Scope [id number] Procedure:             Upper GI endoscopy Indications:           Abnormal CT of the GI tract Providers:             Corinn Jess Brooklyn MD, MD Referring MD:          Corinn Jess Brooklyn MD, MD (Referring MD), Ophelia Sage,                         MD (Referring MD) Medicines:             General Anesthesia Complications:         No immediate complications. Estimated blood loss: None. Procedure:             Pre-Anesthesia Assessment:                        - Prior to the procedure, a History and Physical was                         performed, and patient medications and allergies were                         reviewed. The patient is competent. The risks and                         benefits of the procedure and the sedation options and                         risks were discussed with the patient. All questions                         were answered and informed consent was obtained.                         Patient identification and proposed procedure were                         verified by the physician, the nurse, the                         anesthesiologist, the anesthetist and the technician                         in the pre-procedure area in the procedure room in the                         endoscopy suite. Mental Status Examination: alert and                         oriented. Airway Examination: normal oropharyngeal                         airway and neck mobility. Respiratory Examination:  clear to auscultation. CV Examination: normal.                         Prophylactic Antibiotics: The patient does not require                         prophylactic  antibiotics. Prior Anticoagulants: The                         patient has taken no anticoagulant or antiplatelet                         agents. ASA Grade Assessment: III - A patient with                         severe systemic disease. After reviewing the risks and                         benefits, the patient was deemed in satisfactory                         condition to undergo the procedure. The anesthesia                         plan was to use general anesthesia. Immediately prior                         to administration of medications, the patient was                         re-assessed for adequacy to receive sedatives. The                         heart rate, respiratory rate, oxygen saturations,                         blood pressure, adequacy of pulmonary ventilation, and                         response to care were monitored throughout the                         procedure. The physical status of the patient was                         re-assessed after the procedure.                        After obtaining informed consent, the endoscope was                         passed under direct vision. Throughout the procedure,                         the patient's blood pressure, pulse, and oxygen                         saturations were monitored continuously. The Endoscope  was introduced through the mouth, and advanced to the                         second part of duodenum. The upper GI endoscopy was                         accomplished without difficulty. The patient tolerated                         the procedure well. Findings:      The duodenal bulb and second portion of the duodenum were normal.      Diffuse moderately erythematous mucosa without bleeding was found on the       greater curvature of the gastric body and in the gastric antrum.       Biopsies were taken with a cold forceps for histology.      The cardia and gastric fundus were normal on  retroflexion.      The gastroesophageal junction and examined esophagus were normal. Impression:            - Normal duodenal bulb and second portion of the                         duodenum.                        - Erythematous mucosa in the greater curvature of the                         gastric body and antrum. Biopsied.                        - Normal gastroesophageal junction and esophagus. Recommendation:        - Await pathology results.                        - Discharge patient to home (with escort).                        - Resume previous diet today.                        - Continue present medications. Procedure Code(s):     --- Professional ---                        [phone], Esophagogastroduodenoscopy, flexible,                         transoral; with biopsy, single or multiple Diagnosis Code(s):     --- Professional ---                        K31.89, Other diseases of stomach and duodenum                        R93.3, Abnormal findings on diagnostic imaging of                         other parts of digestive tract CPT copyright 2022 [employer]. All rights reserved. The codes documented in  this report are preliminary and upon coder review may  be revised to meet current compliance requirements. Dr. Corinn Brooklyn Corinn Jess Brooklyn MD, MD 05/30/2024 10:08:59 AM This report has been signed electronically. Number of Addenda: 0 Note Initiated On: 05/30/2024 9:43 AM Estimated Blood Loss:  Estimated blood loss: none.      [facility]

## 2024-05-30 NOTE — Anesthesia Preprocedure Evaluation (Signed)
 Anesthesia Evaluation  Patient identified by MRN, date of birth, ID band Patient awake    Reviewed: Allergy & Precautions, NPO status , Patient's Chart, lab work & pertinent test results  History of Anesthesia Complications Negative for: history of anesthetic complications  Airway Mallampati: III  TM Distance: <3 FB Neck ROM: full    Dental  (+) Chipped   Pulmonary neg shortness of breath, asthma , former smoker   Pulmonary exam normal        Cardiovascular Exercise Tolerance: Good hypertension, (-) angina Normal cardiovascular exam     Neuro/Psych negative neurological ROS     GI/Hepatic negative GI ROS, Neg liver ROS,neg GERD  ,,  Endo/Other  negative endocrine ROS    Renal/GU Renal disease  negative genitourinary   Musculoskeletal   Abdominal   Peds  Hematology negative hematology ROS (+)   Anesthesia Other Findings Past Medical History: No date: Arthritis No date: Asthma 2011,2015: Bowel obstruction (HCC) No date: Depression 2006: Diverticulitis No date: Enlarged prostate 07/04/2014: Gallstones No date: History of gastrointestinal ulcer No date: History of kidney stones No date: HLD (hyperlipidemia) No date: HTN (hypertension) No date: Kidney stone     Comment:  kidney stones 05/29/2015: Neuritis or radiculitis due to rupture of lumbar  intervertebral disc No date: Thyroid disease  Past Surgical History: 09/05/2004: COLECTOMY     Comment:  Abdominal colectomy, ileostomy, Hartman procedure for               perforation of cecum, sigmoid diverticulitis 2008?: HERNIA REPAIR     Comment:  [place] 09/05/2005: ILEOSTOMY 09/05/2012: JOINT REPLACEMENT     Comment:  Hip Replacement [name] 09/05/2009: KNEE SURGERY; Right 03/13/2019: POSTERIOR LUMBAR FUSION 4 LEVEL; N/A     Comment:  Procedure: POSTERIOR LUMBAR FUSION 4 LEVEL L1-5;                Surgeon: Clois Fret, MD;  Location: [facility] ORS;                Service: Neurosurgery;  Laterality: N/A; 05/05/2021: TRIGGER FINGER RELEASE; Right     Comment:  Procedure: RELEASE TRIGGER FINGER/A-1 PULLEY;  Surgeon:               Edie Norleen PARAS, MD;  Location: [facility] ORS;  Service:               Orthopedics;  Laterality: Right;  BMI    Body Mass Index: 25.84 kg/m      Reproductive/Obstetrics negative OB ROS                              Anesthesia Physical Anesthesia Plan  ASA: 3  Anesthesia Plan: General   Post-op Pain Management:    Induction: Intravenous  PONV Risk Score and Plan: Propofol  infusion and TIVA  Airway Management Planned: Natural Airway and Nasal Cannula  Additional Equipment:   Intra-op Plan:   Post-operative Plan:   Informed Consent: I have reviewed the patients History and Physical, chart, labs and discussed the procedure including the risks, benefits and alternatives for the proposed anesthesia with the patient or authorized representative who has indicated his/her understanding and acceptance.     Dental Advisory Given  Plan Discussed with: Anesthesiologist, CRNA and Surgeon  Anesthesia Plan Comments: (Patient consented for risks of anesthesia including but not limited to:  - adverse reactions to medications - risk of airway placement if  required - damage to eyes, teeth, lips or other oral mucosa - nerve damage due to positioning  - sore throat or hoarseness - Damage to heart, brain, nerves, lungs, other parts of body or loss of life  Patient voiced understanding and assent.)        Anesthesia Quick Evaluation

## 2024-06-03 LAB — SURGICAL PATHOLOGY

## 2024-06-05 ENCOUNTER — Ambulatory Visit: Payer: Self-pay | Admitting: Gastroenterology

## 2024-06-05 ENCOUNTER — Ambulatory Visit (INDEPENDENT_AMBULATORY_CARE_PROVIDER_SITE_OTHER): Admitting: Surgery

## 2024-06-05 ENCOUNTER — Encounter (HOSPITAL_COMMUNITY): Payer: Self-pay

## 2024-06-05 ENCOUNTER — Encounter: Payer: Self-pay | Admitting: Surgery

## 2024-06-05 ENCOUNTER — Ambulatory Visit
Admission: RE | Admit: 2024-06-05 | Discharge: 2024-06-05 | Disposition: A | Discharge status: Home / Self Care | Source: Ambulatory Visit | Attending: Surgery | Admitting: Surgery

## 2024-06-05 ENCOUNTER — Inpatient Hospital Stay
Admission: AD | Admit: 2024-06-05 | Discharge: 2024-06-14 | DRG: 901 | Disposition: A | Discharge status: Home Health | Source: Ambulatory Visit | Attending: Surgery | Admitting: Surgery

## 2024-06-05 VITALS — BP 138/76 | HR 103 | Temp 98.3°F | Ht 69.0 in | Wt 174.8 lb

## 2024-06-05 DIAGNOSIS — Y832 Surgical operation with anastomosis, bypass or graft as the cause of abnormal reaction of the patient, or of later complication, without mention of misadventure at the time of the procedure: Secondary | ICD-10-CM | POA: Diagnosis present

## 2024-06-05 DIAGNOSIS — K651 Peritoneal abscess: Secondary | ICD-10-CM | POA: Diagnosis present

## 2024-06-05 DIAGNOSIS — Z885 Allergy status to narcotic agent status: Secondary | ICD-10-CM

## 2024-06-05 DIAGNOSIS — Z791 Long term (current) use of non-steroidal anti-inflammatories (NSAID): Secondary | ICD-10-CM

## 2024-06-05 DIAGNOSIS — N400 Enlarged prostate without lower urinary tract symptoms: Secondary | ICD-10-CM | POA: Diagnosis present

## 2024-06-05 DIAGNOSIS — Z8711 Personal history of peptic ulcer disease: Secondary | ICD-10-CM | POA: Diagnosis not present

## 2024-06-05 DIAGNOSIS — R1010 Upper abdominal pain, unspecified: Secondary | ICD-10-CM

## 2024-06-05 DIAGNOSIS — R188 Other ascites: Secondary | ICD-10-CM | POA: Diagnosis present

## 2024-06-05 DIAGNOSIS — K8020 Calculus of gallbladder without cholecystitis without obstruction: Secondary | ICD-10-CM | POA: Diagnosis present

## 2024-06-05 DIAGNOSIS — Z9109 Other allergy status, other than to drugs and biological substances: Secondary | ICD-10-CM

## 2024-06-05 DIAGNOSIS — N179 Acute kidney failure, unspecified: Secondary | ICD-10-CM | POA: Diagnosis present

## 2024-06-05 DIAGNOSIS — D638 Anemia in other chronic diseases classified elsewhere: Secondary | ICD-10-CM | POA: Diagnosis present

## 2024-06-05 DIAGNOSIS — E785 Hyperlipidemia, unspecified: Secondary | ICD-10-CM | POA: Diagnosis present

## 2024-06-05 DIAGNOSIS — Z87891 Personal history of nicotine dependence: Secondary | ICD-10-CM

## 2024-06-05 DIAGNOSIS — E079 Disorder of thyroid, unspecified: Secondary | ICD-10-CM | POA: Diagnosis present

## 2024-06-05 DIAGNOSIS — T8579XA Infection and inflammatory reaction due to other internal prosthetic devices, implants and grafts, initial encounter: Secondary | ICD-10-CM | POA: Diagnosis present

## 2024-06-05 DIAGNOSIS — R7989 Other specified abnormal findings of blood chemistry: Secondary | ICD-10-CM | POA: Diagnosis present

## 2024-06-05 DIAGNOSIS — Z79899 Other long term (current) drug therapy: Secondary | ICD-10-CM

## 2024-06-05 DIAGNOSIS — J45909 Unspecified asthma, uncomplicated: Secondary | ICD-10-CM | POA: Diagnosis present

## 2024-06-05 DIAGNOSIS — E860 Dehydration: Secondary | ICD-10-CM | POA: Diagnosis present

## 2024-06-05 DIAGNOSIS — M1990 Unspecified osteoarthritis, unspecified site: Secondary | ICD-10-CM | POA: Diagnosis present

## 2024-06-05 DIAGNOSIS — Z96649 Presence of unspecified artificial hip joint: Secondary | ICD-10-CM | POA: Diagnosis present

## 2024-06-05 DIAGNOSIS — F32A Depression, unspecified: Secondary | ICD-10-CM | POA: Diagnosis present

## 2024-06-05 DIAGNOSIS — K660 Peritoneal adhesions (postprocedural) (postinfection): Secondary | ICD-10-CM | POA: Diagnosis present

## 2024-06-05 DIAGNOSIS — Z79890 Hormone replacement therapy (postmenopausal): Secondary | ICD-10-CM | POA: Diagnosis not present

## 2024-06-05 DIAGNOSIS — I10 Essential (primary) hypertension: Secondary | ICD-10-CM | POA: Diagnosis present

## 2024-06-05 DIAGNOSIS — L02211 Cutaneous abscess of abdominal wall: Secondary | ICD-10-CM

## 2024-06-05 DIAGNOSIS — Z87442 Personal history of urinary calculi: Secondary | ICD-10-CM

## 2024-06-05 DIAGNOSIS — K432 Incisional hernia without obstruction or gangrene: Secondary | ICD-10-CM | POA: Diagnosis present

## 2024-06-05 LAB — CBC
HCT: 33.2 % — ABNORMAL LOW (ref 39.0–52.0)
Hemoglobin: 10.7 g/dL — ABNORMAL LOW (ref 13.0–17.0)
MCH: 26.5 pg (ref 26.0–34.0)
MCHC: 32.2 g/dL (ref 30.0–36.0)
MCV: 82.2 fL (ref 80.0–100.0)
Platelets: 308 K/uL (ref 150–400)
RBC: 4.04 MIL/uL — ABNORMAL LOW (ref 4.22–5.81)
RDW: 13.3 % (ref 11.5–15.5)
WBC: 11.1 K/uL — ABNORMAL HIGH (ref 4.0–10.5)
nRBC: 0 % (ref 0.0–0.2)

## 2024-06-05 LAB — COMPREHENSIVE METABOLIC PANEL WITH GFR
ALT: 14 U/L (ref 0–44)
AST: 24 U/L (ref 15–41)
Albumin: 3.2 g/dL — ABNORMAL LOW (ref 3.5–5.0)
Alkaline Phosphatase: 150 U/L — ABNORMAL HIGH (ref 38–126)
Anion gap: 11 (ref 5–15)
BUN: 28 mg/dL — ABNORMAL HIGH (ref 8–23)
CO2: 23 mmol/L (ref 22–32)
Calcium: 9.6 mg/dL (ref 8.9–10.3)
Chloride: 99 mmol/L (ref 98–111)
Creatinine, Ser: 1.9 mg/dL — ABNORMAL HIGH (ref 0.61–1.24)
GFR, Estimated: 35 mL/min — ABNORMAL LOW
Glucose, Bld: 95 mg/dL (ref 70–99)
Potassium: 4.3 mmol/L (ref 3.5–5.1)
Sodium: 133 mmol/L — ABNORMAL LOW (ref 135–145)
Total Bilirubin: 0.8 mg/dL (ref 0.0–1.2)
Total Protein: 7.3 g/dL (ref 6.5–8.1)

## 2024-06-05 LAB — PROTIME-INR
INR: 1.2 (ref 0.8–1.2)
Prothrombin Time: 15.6 s — ABNORMAL HIGH (ref 11.4–15.2)

## 2024-06-05 LAB — MAGNESIUM: Magnesium: 2.1 mg/dL (ref 1.7–2.4)

## 2024-06-05 MED ORDER — OXYCODONE HCL 5 MG PO TABS
5.0000 mg | ORAL_TABLET | ORAL | Status: DC | PRN
  Administered 2024-06-08: 5 mg via ORAL
  Filled 2024-06-05: qty 1

## 2024-06-05 MED ORDER — ACETAMINOPHEN 500 MG PO TABS
1000.0000 mg | ORAL_TABLET | Freq: Four times a day (QID) | ORAL | Status: DC
  Administered 2024-06-05 – 2024-06-08 (×6): 1000 mg via ORAL
  Filled 2024-06-05 (×7): qty 2

## 2024-06-05 MED ORDER — AMOXICILLIN-POT CLAVULANATE 875-125 MG PO TABS: 1.0000 | ORAL_TABLET | Freq: Two times a day (BID) | ORAL | 0 refills | Status: DC

## 2024-06-05 MED ORDER — FINASTERIDE 5 MG PO TABS
5.0000 mg | ORAL_TABLET | Freq: Every day | ORAL | Status: DC
  Administered 2024-06-06 – 2024-06-14 (×8): 5 mg via ORAL
  Filled 2024-06-05 (×8): qty 1

## 2024-06-05 MED ORDER — TAMSULOSIN HCL 0.4 MG PO CAPS
0.4000 mg | ORAL_CAPSULE | Freq: Every day | ORAL | Status: DC
  Administered 2024-06-06 – 2024-06-13 (×8): 0.4 mg via ORAL
  Filled 2024-06-05 (×9): qty 1

## 2024-06-05 MED ORDER — LEVOTHYROXINE SODIUM 50 MCG PO TABS
125.0000 ug | ORAL_TABLET | Freq: Every day | ORAL | Status: DC
  Administered 2024-06-06 – 2024-06-14 (×8): 125 ug via ORAL
  Filled 2024-06-05 (×8): qty 1

## 2024-06-05 MED ORDER — ONDANSETRON HCL 4 MG/2ML IJ SOLN: 4.0000 mg | Freq: Four times a day (QID) | INTRAMUSCULAR | Status: DC | PRN

## 2024-06-05 MED ORDER — ONDANSETRON 4 MG PO TBDP: 4.0000 mg | ORAL_TABLET | Freq: Four times a day (QID) | ORAL | Status: DC | PRN

## 2024-06-05 MED ORDER — IOHEXOL 300 MG/ML  SOLN
100.0000 mL | Freq: Once | INTRAMUSCULAR | Status: AC | PRN
  Administered 2024-06-05: 100 mL via INTRAVENOUS

## 2024-06-05 MED ORDER — ENOXAPARIN SODIUM 40 MG/0.4ML IJ SOSY
40.0000 mg | PREFILLED_SYRINGE | INTRAMUSCULAR | Status: DC
  Administered 2024-06-05: 40 mg via SUBCUTANEOUS
  Filled 2024-06-05: qty 0.4

## 2024-06-05 MED ORDER — PIPERACILLIN-TAZOBACTAM 3.375 G IVPB
3.3750 g | Freq: Three times a day (TID) | INTRAVENOUS | Status: AC
  Administered 2024-06-05 – 2024-06-12 (×20): 3.375 g via INTRAVENOUS
  Filled 2024-06-05 (×21): qty 50

## 2024-06-05 MED ORDER — POLYETHYLENE GLYCOL 3350 17 G PO PACK: 17.0000 g | PACK | Freq: Every day | ORAL | Status: DC | PRN

## 2024-06-05 MED ORDER — LOPERAMIDE HCL 2 MG PO CAPS
4.0000 mg | ORAL_CAPSULE | Freq: Three times a day (TID) | ORAL | Status: DC
  Administered 2024-06-05 – 2024-06-06 (×4): 4 mg via ORAL
  Filled 2024-06-05 (×4): qty 2

## 2024-06-05 MED ORDER — HYDROMORPHONE HCL 1 MG/ML IJ SOLN: 0.5000 mg | INTRAMUSCULAR | Status: DC | PRN

## 2024-06-05 MED ORDER — MONTELUKAST SODIUM 10 MG PO TABS
10.0000 mg | ORAL_TABLET | Freq: Every day | ORAL | Status: DC
Start: 2024-06-05 — End: 2024-06-08
  Administered 2024-06-05 – 2024-06-07 (×3): 10 mg via ORAL
  Filled 2024-06-05 (×3): qty 1

## 2024-06-05 MED ORDER — PANTOPRAZOLE SODIUM 40 MG IV SOLR
40.0000 mg | Freq: Every day | INTRAVENOUS | Status: DC
  Administered 2024-06-05 – 2024-06-12 (×8): 40 mg via INTRAVENOUS
  Filled 2024-06-05 (×8): qty 10

## 2024-06-05 MED ORDER — ATORVASTATIN CALCIUM 10 MG PO TABS
10.0000 mg | ORAL_TABLET | Freq: Every day | ORAL | Status: DC
Start: 2024-06-05 — End: 2024-06-08
  Administered 2024-06-05 – 2024-06-07 (×3): 10 mg via ORAL
  Filled 2024-06-05 (×3): qty 1

## 2024-06-05 MED ORDER — LOSARTAN POTASSIUM 50 MG PO TABS
50.0000 mg | ORAL_TABLET | Freq: Every day | ORAL | Status: DC
Start: 2024-06-06 — End: 2024-06-06
  Administered 2024-06-06: 50 mg via ORAL
  Filled 2024-06-05: qty 1

## 2024-06-05 NOTE — Patient Instructions (Signed)
 Incision and Drainage, Care After After incision and drainage, it is common to have: Pain or discomfort around the incision site. Blood, fluid, or pus (drainage) from the incision. Redness and firm skin around the incision site. Follow these instructions at home: Medicines Take over-the-counter and prescription medicines only as told by your health care provider. If you were prescribed antibiotics, take them as told by your provider. Do not stop using the antibiotic even if you start to feel better. Do not apply creams, ointments, or liquids unless you have been told to by your provider. Wound care Follow instructions from your provider about how to take care of your wound. Make sure you: Wash your hands with soap and water for at least 20 seconds before and after you change your bandage (dressing). If soap and water are not available, use hand sanitizer. Change your dressing and any packing as told by your provider. If the dressing is dry or stuck when you try to remove it, moisten or wet it with saline or water. This will help you remove it without harming your skin or tissues. If your wound is packed, leave it in place until your provider tells you to remove it. To remove it, moisten or wet the packing with saline or water. Leave stitches (sutures), skin glue, or tape strips in place. These skin closures may need to stay in place for 2 weeks or longer. If tape strip edges start to loosen and curl up, you may trim the loose edges. Do not remove tape strips completely unless your provider tells you to do that. Check your wound every day for signs of infection. Check for: More redness, swelling, or pain. More fluid or blood. Warmth. Pus or a bad smell. If you were sent home with a drain tube in place, follow instructions from your provider about: How to empty it. How to care for it at home. Be careful when you get rid of used dressings, wound packing, or drainage. Activity Rest the  affected area. Return to your normal activities as told by your provider. Ask your provider what activities are safe for you. General instructions Do not use any products that contain nicotine or tobacco. These products include cigarettes, chewing tobacco, and vaping devices, such as e-cigarettes. These can delay incision healing after surgery. If you need help quitting, ask your provider. Do not take baths, swim, or use a hot tub until your provider approves. Ask your provider if you may take showers. You may only be allowed to take sponge baths. The incision will keep draining. It is normal to have some clear or slightly bloody drainage. The amount of drainage should go down each day. Keep all follow-up visits. Your provider will need to make sure that your incision is healing well and that there are no problems. Your health care provider may give you more instructions. Make sure you know what you can and cannot do Contact a health care provider if: Your cyst or abscess comes back. You have any signs of infection. You notice red streaks that spread away from the incision site. You have a fever or chills. Get help right away if: You have severe pain or bleeding. You become short of breath. You have chest pain. You have signs of a severe infection. You may notice changes in your incision area, such as: Swelling that makes the skin feel hard. Numbness or tingling. Sudden increase in redness. Your skin color may change from red to purple, and then to dark  spots. Blisters, ulcers, or splitting of the skin. These symptoms may be an emergency. Get help right away. Call 911. Do not wait to see if the symptoms will go away. Do not drive yourself to the hospital. This information is not intended to replace advice given to you by your health care provider. Make sure you discuss any questions you have with your health care provider. Document Revised: 04/11/2022 Document Reviewed: 04/11/2022 Elsevier  Patient Education  2024 [employer].

## 2024-06-05 NOTE — H&P (Signed)
 Date of Admission:  06/05/2024  Reason for Admission:  Intra-abdominal abscess and infected mesh  History of Present Illness: Stephen Cervantes is a 83 y.o. male presenting to clinic for follow up of upper abdominal pain.  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  He had a CT scan on 03/24/24 showing inflammatory changes beneath the mesh in the LUQ and RUQ.  This improved with antibiotic management as noted on repeat CT scan on 04/15/24.  He has a history of PUD and had prior admission due to bleeding duodenal ulcer, and given this, he was referred to GI for EGD to further evaluate for other possible etiology to his abdominal pain.  EGD was done by Dr. Unk on 05/30/24 which showed diffuse moderately erythematous mucosa without bleeding in the greater curvature and antrum.  Biopsies were negative for H pylori or malignancy/atypia.     Today he reports that he's been feeling tired over the past 5 days, and has noted an area of swelling developing in the lower abdomen over those days as well.  Denies any fevers or chills, but reports worsening swelling and pain.  In the office, he was noted to have an abdominal wall abscess and had I&D in the office, revealing foul smelling purulent fluid.  CT scan was obtained stat today to further evaluate, and this showed an intra-abdominal fluid collection at the level of his hernia mesh, tracking out to his I&D site.  There is also concern about possible oral contrast extravasation into this abscess cavity.  Past Medical History: Past Medical History:  Diagnosis Date   Arthritis    Asthma    Bowel obstruction (HCC) 2011,2015   Depression    Diverticulitis 2006   Enlarged prostate    Gallstones 07/04/2014   History of gastrointestinal ulcer    History of kidney stones    HLD (hyperlipidemia)    HTN (hypertension)    Kidney  stone    kidney stones   Neuritis or radiculitis due to rupture of lumbar intervertebral disc 05/29/2015   Thyroid disease      Past Surgical History: Past Surgical History:  Procedure Laterality Date   COLECTOMY  09/05/2004   Abdominal colectomy, ileostomy, Hartman procedure for perforation of cecum, sigmoid diverticulitis   ESOPHAGOGASTRODUODENOSCOPY N/A 05/30/2024   Procedure: EGD (ESOPHAGOGASTRODUODENOSCOPY);  Surgeon: [name], MD;  Location: [facility] ENDOSCOPY;  Service: Gastroenterology;  Laterality: N/A;   HERNIA REPAIR  2008?   [facility]   ILEOSTOMY  09/05/2005   JOINT REPLACEMENT  09/05/2012   Hip Replacement [name]   KNEE SURGERY Right 09/05/2009   POSTERIOR LUMBAR FUSION 4 LEVEL N/A 03/13/2019   Procedure: POSTERIOR LUMBAR FUSION 4 LEVEL L1-5;  Surgeon: Clois Fret, MD;  Location: [facility] ORS;  Service: Neurosurgery;  Laterality: N/A;   TRIGGER FINGER RELEASE Right 05/05/2021   Procedure: RELEASE TRIGGER FINGER/A-1 PULLEY;  Surgeon: Edie Norleen PARAS, MD;  Location: [facility] ORS;  Service: Orthopedics;  Laterality: Right;    Home Medications: Prior to Admission medications   Medication Sig Start Date End Date Taking? Authorizing Provider  amoxicillin -clavulanate (AUGMENTIN ) 875-125 MG tablet Take 1 tablet by mouth 2 (two) times daily for 10 days. 06/05/24 06/15/24  Desiderio Schanz, MD  atorvastatin  (LIPITOR) 10 MG tablet Take 10 mg by mouth daily.     [provider]  celecoxib  (CELEBREX ) 200 MG  capsule Take 1 capsule (200 mg total) by mouth daily. 03/13/23   Hilma Hastings, PA-C  ferrous sulfate 325 (65 FE) MG tablet Take 325 mg by mouth daily with breakfast.    [provider]  finasteride  (PROSCAR ) 5 MG tablet TAKE ONE TABLET EVERY DAY 01/12/24   McGowan, Clotilda A, PA-C  levothyroxine  (SYNTHROID ) 125 MCG tablet Take 125 mcg by mouth daily. 03/05/24   [provider]  loperamide  (IMODIUM ) 2 MG capsule Take 6 mg by mouth in  the morning, at noon, and at bedtime.    [provider]  losartan (COZAAR) 50 MG tablet Take 50 mg by mouth daily. 03/05/24   [provider]  montelukast  (SINGULAIR ) 10 MG tablet Take 1 tablet by mouth daily. 06/16/23   [provider]  Multiple Vitamin (MULTI-VITAMIN) tablet Take 1 tablet by mouth daily.    [provider]  pantoprazole  (PROTONIX ) 40 MG tablet Take 1 tablet (40 mg total) by mouth 2 (two) times daily before a meal for 14 days. 03/24/24 06/05/24  [name], MD  potassium citrate  (UROCIT-K ) 10 MEQ (1080 MG) SR tablet Take 10 mEq by mouth 4 (four) times daily. 04/04/19   [provider]  sucralfate  (CARAFATE ) 1 g tablet Take 1 g by mouth in the morning, at noon, and at bedtime.    [provider]  tamsulosin  (FLOMAX ) 0.4 MG CAPS capsule TAKE 1 CAPSULE BY MOUTH ONCE DAILY 01/12/24   Helon Clotilda A, PA-C    Allergies: Allergies  Allergen Reactions   Morphine And Codeine Other (See Comments)    Severe hallucinations   Tape Other (See Comments)    Silk Tape per patient peels off my skin    Social History:  reports that he quit smoking about 27 years ago. His smoking use included cigarettes. He has never used smokeless tobacco. He reports that he does not drink alcohol and does not use drugs.   Family History: Family History  Problem Relation Age of Onset   Cancer Father        cancer of lung, brain  and lymphoma   Parkinson's disease Brother    Mesothelioma Brother    Kidney disease Neg Hx    Prostate cancer Neg Hx    Kidney cancer Neg Hx    Bladder Cancer Neg Hx     Review of Systems: Review of Systems  Constitutional:  Negative for chills and fever.  HENT:  Negative for hearing loss.   Respiratory:  Negative for shortness of breath.   Cardiovascular:  Negative for chest pain.  Gastrointestinal:  Positive for abdominal pain. Negative for nausea and vomiting.  Genitourinary:  Negative for dysuria.   Musculoskeletal:  Negative for myalgias.  Skin:  Positive for rash (erythema and induration of abdominal wall).  Neurological:  Negative for dizziness.  Psychiatric/Behavioral:  Negative for depression.     Physical Exam BP (!) 99/56 (BP Location: Right Arm)   Pulse 84   Temp 98.2 F (36.8 C) (Oral)   Resp 16   SpO2 95%  CONSTITUTIONAL: No acute distress, well nourished. HEENT:  Normocephalic, atraumatic, extraocular motion intact. NECK: Trachea is midline, and there is no jugular venous distension.  RESPIRATORY:  Lungs are clear, and breath sounds are equal bilaterally. Normal respiratory effort without pathologic use of accessory muscles. CARDIOVASCULAR: Heart is regular without murmurs, gallops, or rubs. GI: In office today, the abdomen is soft, with tenderness in the lower mid abdomen at the inferior aspect of his midline  incision.  He has an area of erythema and induration covering about 10 cm with central fluctuance and a small portion where the skin is ulcerated.  This was I&D'd in office revealing purulent fluid.  It was sent for culture. MUSCULOSKELETAL:  Normal muscle strength and tone in all four extremities.  No peripheral edema or cyanosis. SKIN: Skin turgor is normal. There are no pathologic skin lesions.  NEUROLOGIC:  Motor and sensation is grossly normal.  Cranial nerves are grossly intact. PSYCH:  Alert and oriented to person, place and time. Affect is normal.  Laboratory Analysis: Results for orders placed or performed during the hospital encounter of 06/05/24 (from the past 24 hours)  Magnesium      Status: None   Collection Time: 06/05/24  6:51 PM  Result Value Ref Range   Magnesium  2.1 1.7 - 2.4 mg/dL  CBC     Status: Abnormal   Collection Time: 06/05/24  6:51 PM  Result Value Ref Range   WBC 11.1 (H) 4.0 - 10.5 K/uL   RBC 4.04 (L) 4.22 - 5.81 MIL/uL   Hemoglobin 10.7 (L) 13.0 - 17.0 g/dL   HCT 66.7 (L) 60.9 - 47.9 %   MCV 82.2 80.0 - 100.0 fL   MCH 26.5  26.0 - 34.0 pg   MCHC 32.2 30.0 - 36.0 g/dL   RDW 86.6 88.4 - 84.4 %   Platelets 308 150 - 400 K/uL   nRBC 0.0 0.0 - 0.2 %  Protime-INR     Status: Abnormal   Collection Time: 06/05/24  6:51 PM  Result Value Ref Range   Prothrombin Time 15.6 (H) 11.4 - 15.2 seconds   INR 1.2 0.8 - 1.2  Comprehensive metabolic panel     Status: Abnormal   Collection Time: 06/05/24  6:51 PM  Result Value Ref Range   Sodium 133 (L) 135 - 145 mmol/L   Potassium 4.3 3.5 - 5.1 mmol/L   Chloride 99 98 - 111 mmol/L   CO2 23 22 - 32 mmol/L   Glucose, Bld 95 70 - 99 mg/dL   BUN 28 (H) 8 - 23 mg/dL   Creatinine, Ser 8.09 (H) 0.61 - 1.24 mg/dL   Calcium  9.6 8.9 - 10.3 mg/dL   Total Protein 7.3 6.5 - 8.1 g/dL   Albumin 3.2 (L) 3.5 - 5.0 g/dL   AST 24 15 - 41 U/L   ALT 14 0 - 44 U/L   Alkaline Phosphatase 150 (H) 38 - 126 U/L   Total Bilirubin 0.8 0.0 - 1.2 mg/dL   GFR, Estimated 35 (L) >60 mL/min   Anion gap 11 5 - 15    Imaging: CT ABDOMEN PELVIS W CONTRAST Result Date: 06/05/2024 CLINICAL DATA:  Abdominal abscess. EXAM: CT ABDOMEN AND PELVIS WITH CONTRAST TECHNIQUE: Multidetector CT imaging of the abdomen and pelvis was performed using the standard protocol following bolus administration of intravenous contrast. RADIATION DOSE REDUCTION: This exam was performed according to the departmental dose-optimization program which includes automated exposure control, adjustment of the mA and/or kV according to patient size and/or use of iterative reconstruction technique. CONTRAST:  OMNIPAQUE  IOHEXOL  300 MG/ML  SOLN COMPARISON:  04/15/2024 FINDINGS: Lower chest: Heart is normal size. Calcified plaque over the descending thoracic aorta. Lung bases demonstrate minimal linear atelectasis over the right lower lobe. Hepatobiliary: Mild cholelithiasis. Liver and biliary tree are normal. Pancreas: Normal. Spleen: Normal. Adrenals/Urinary Tract: Adrenal glands are normal. Kidneys are normal in size he with moderate stable  bilateral nephrolithiasis. No hydronephrosis  or perinephric inflammation/fluid. Multiple small bilateral renal cysts unchanged. No follow-up imaging is recommended. Visualized ureters and bladder are normal. Note that the distal right ureter is obscured by streak artifact from the right hip prosthesis. Stomach/Bowel: Stomach and small bowel are normal. Evidence of prior subtotal colectomy. Vascular/Lymphatic: Calcified plaque over the abdominal aorta which is normal caliber. Remaining vascular structures are unremarkable. No adenopathy. Reproductive: Prostate is unremarkable. Other: Evidence of patient's previous ventral hernia repair. No evidence of recurrent hernia. There is interval development of collection of air and fluid over the midline anterior abdominal wall incision site measuring approximately 2.2 x 3.8 cm in transverse and AP dimension. There is stranding of the adjacent subcutaneous fat. Development of several small collections of air along the left side of the abdominal wall hernia repair site. Findings likely due to infection. There is subtle increased density within this collection of air and fluid at the incision site as cannot exclude a fistulous connection to the underlying small bowel abutting the surgical site. Musculoskeletal: Left hip arthroplasty. Posterior fusion hardware from L1-L5. IMPRESSION: 1. Evidence of patient's previous ventral hernia repair. Interval development of collection of air and fluid over the midline anterior abdominal wall incision site measuring 2.2 x 3.8 cm in transverse and AP dimension. Development of several small collections of air along the left side of the abdominal wall hernia repair site. Findings likely due to infection. There is subtle increased density within this collection which may represent oral contrast as cannot exclude a fistulous connection to the underlying small bowel. 2. Mild cholelithiasis. 3. Moderate stable bilateral nephrolithiasis. 4. Aortic  atherosclerosis. Aortic Atherosclerosis (ICD10-I70.0). Electronically Signed   By: Toribio Agreste M.D.   On: 06/05/2024 13:29    Assessment and Plan: This is a 83 y.o. male with intra-abdominal abscess and mesh infection.  --Discussed with the patient the results of the CT scan and advised that he would need admission today.  Was able to get direct-admitted today.  Will give a diet, start him on IV Zosyn, home meds, appropriate pain control.  Discussed with Dr. Jordis and we'll take him to the OR on 06/07/24 for abdominal exploration, mesh excision, and abdominal wall reconstruction.  Plan discussed with patient.     Aloysius Sheree Plant, MD [facility] Pg:  [phone]

## 2024-06-05 NOTE — Progress Notes (Signed)
 06/05/2024  History of Present Illness: Stephen Cervantes is a 83 y.o. male presenting for follow up of upper abdominal pain.  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  He had a CT scan on 03/24/24 showing inflammatory changes beneath the mesh in the LUQ and RUQ.  This improved with antibiotic management as noted on repeat CT scan on 04/15/24.  He has a history of PUD and had prior admission due to bleeding duodenal ulcer, and given this, he was referred to GI for EGD to further evaluate for other possible etiology to his abdominal pain.  EGD was done by Dr. Unk on 05/30/24 which showed diffuse moderately erythematous mucosa without bleeding in the greater curvature and antrum.  Biopsies were negative for H pylori or malignancy/atypia.    Today he reports that he's been feeling tired over the past 5 days, and has noted an area of swelling developing in the lower abdomen over those days as well.  Denies any fevers or chills, but reports worsening swelling and pain.  Past Medical History: Past Medical History:  Diagnosis Date   Arthritis    Asthma    Bowel obstruction (HCC) 2011,2015   Depression    Diverticulitis 2006   Enlarged prostate    Gallstones 07/04/2014   History of gastrointestinal ulcer    History of kidney stones    HLD (hyperlipidemia)    HTN (hypertension)    Kidney stone    kidney stones   Neuritis or radiculitis due to rupture of lumbar intervertebral disc 05/29/2015   Thyroid disease      Past Surgical History: Past Surgical History:  Procedure Laterality Date   COLECTOMY  09/05/2004   Abdominal colectomy, ileostomy, Hartman procedure for perforation of cecum, sigmoid diverticulitis   ESOPHAGOGASTRODUODENOSCOPY N/A 05/30/2024   Procedure: EGD (ESOPHAGOGASTRODUODENOSCOPY);  Surgeon: [name], MD;  Location: [facility] ENDOSCOPY;   Service: Gastroenterology;  Laterality: N/A;   HERNIA REPAIR  2008?   [facility]   ILEOSTOMY  09/05/2005   JOINT REPLACEMENT  09/05/2012   Hip Replacement [name]   KNEE SURGERY Right 09/05/2009   POSTERIOR LUMBAR FUSION 4 LEVEL N/A 03/13/2019   Procedure: POSTERIOR LUMBAR FUSION 4 LEVEL L1-5;  Surgeon: Clois Fret, MD;  Location: [facility] ORS;  Service: Neurosurgery;  Laterality: N/A;   TRIGGER FINGER RELEASE Right 05/05/2021   Procedure: RELEASE TRIGGER FINGER/A-1 PULLEY;  Surgeon: Edie Norleen PARAS, MD;  Location: [facility] ORS;  Service: Orthopedics;  Laterality: Right;    Home Medications: Prior to Admission medications   Medication Sig Start Date End Date Taking? Authorizing Provider  amoxicillin -clavulanate (AUGMENTIN ) 875-125 MG tablet Take 1 tablet by mouth 2 (two) times daily for 10 days. 06/05/24 06/15/24 Yes Laurajean Hosek, Aloysius, MD  atorvastatin  (LIPITOR) 10 MG tablet Take 10 mg by mouth daily.    Yes [provider]  celecoxib  (CELEBREX ) 200 MG capsule Take 1 capsule (200 mg total) by mouth daily. 03/13/23  Yes Hilma Hastings, PA-C  ferrous sulfate 325 (65 FE) MG tablet Take 325 mg by mouth daily with breakfast.   Yes [provider]  finasteride  (PROSCAR ) 5 MG tablet TAKE ONE TABLET EVERY DAY 01/12/24  Yes McGowan, Clotilda A, PA-C  levothyroxine  (SYNTHROID ) 125 MCG tablet Take 125 mcg by mouth daily. 03/05/24  Yes [provider]  loperamide  (IMODIUM ) 2 MG capsule Take 6 mg by mouth  in the morning, at noon, and at bedtime.   Yes [provider]  losartan (COZAAR) 50 MG tablet Take 50 mg by mouth daily. 03/05/24  Yes [provider]  montelukast  (SINGULAIR ) 10 MG tablet Take 1 tablet by mouth daily. 06/16/23  Yes [provider]  Multiple Vitamin (MULTI-VITAMIN) tablet Take 1 tablet by mouth daily.   Yes [provider]  pantoprazole  (PROTONIX ) 40 MG tablet Take 1 tablet (40 mg total) by mouth 2 (two) times  daily before a meal for 14 days. 03/24/24 06/05/24 Yes [name], MD  potassium citrate  (UROCIT-K ) 10 MEQ (1080 MG) SR tablet Take 10 mEq by mouth 4 (four) times daily. 04/04/19  Yes [provider]  sucralfate  (CARAFATE ) 1 g tablet Take 1 g by mouth in the morning, at noon, and at bedtime.   Yes [provider]  tamsulosin  (FLOMAX ) 0.4 MG CAPS capsule TAKE 1 CAPSULE BY MOUTH ONCE DAILY 01/12/24  Yes McGowan, Clotilda A, PA-C    Allergies: Allergies  Allergen Reactions   Morphine And Codeine Other (See Comments)    Severe hallucinations   Tape Other (See Comments)    Silk Tape per patient peels off my skin    Review of Systems: Review of Systems  Constitutional:  Negative for chills and fever.  HENT:  Negative for hearing loss.   Respiratory:  Negative for shortness of breath.   Cardiovascular:  Negative for chest pain.  Gastrointestinal:  Positive for abdominal pain and diarrhea (chronic). Negative for nausea and vomiting.  Genitourinary:  Negative for dysuria.  Musculoskeletal:  Negative for myalgias.  Skin:  Negative for rash.  Neurological:  Negative for dizziness.  Psychiatric/Behavioral:  Negative for depression.     Physical Exam BP 138/76   Pulse (!) 103   Temp 98.3 F (36.8 C) (Oral)   Ht 5' 9 (1.753 m)   Wt 174 lb 12.8 oz (79.3 kg)   SpO2 99%   BMI 25.81 kg/m  CONSTITUTIONAL: No acute distress, well nourished. HEENT:  Normocephalic, atraumatic, extraocular motion intact. RESPIRATORY:  Normal respiratory effort without pathologic use of accessory muscles. CARDIOVASCULAR: Regular rhythm and rate. GI: The abdomen is soft, with tenderness in the lower mid abdomen at the inferior aspect of his midline incision.  He has an area of erythema and induration covering about 10 cm with central fluctuance and a small portion where the skin is ulcerated.  There is no active drainage, but his shirt has some fluid stain.  This is consistent with an abscess.  Bedside ultrasound appears to show this fluid tracking deep, but unclear if involving the deeper layers of the abdominal wall and/or mesh. NEUROLOGIC:  Motor and sensation is grossly normal.  Cranial nerves are grossly intact. PSYCH:  Alert and oriented to person, place and time. Affect is normal.   Assessment and Plan: This is a 83 y.o. male with abdominal wall abscess.  --Discussed with the patient that he has an abdominal wall abscess.  With his history of prior ventral hernia repair with mesh, and prior findings on CT scan with inflammatory changes in the upper abdomen, my main concern is that this abscess could track deep into the space where the mesh is located.  Discussed with him that we need to do I&D at bedside and will send him for stat CT scan to further evaluate for intra-abdominal component.  The patient is in agreement.   Procedure Date:  06/05/2024  Pre-operative Diagnosis:  Abdominal wall abscess  Post-operative  Diagnosis: Abdominal wall abscess  Procedure:  Incision and Drainage of abdominal wall abscess  Surgeon:  Aloysius Sheree Plant, MD  Anesthesia:  6 ml 1% lidocaine  with epi  Estimated Blood Loss:  3 ml  Specimens:  Culture swab  Complications:  None  Indications for Procedure:  This is a 83 y.o. male with diagnosis of an abdominal wall abscess, requiring drainage procedure.  The risks of bleeding, abscess or infection, injury to surrounding structures, and need for further procedures were all discussed with the patient and was willing to proceed.  Description of Procedure: The patient was correctly identified at bedside.  Appropriate time-outs were performed prior to procedure.  The patient's abdominal wall was prepped and draped in usual sterile fashion.  Local anesthetic was infused intradermally.  An elliptical 2 cm incision was made over the abscess, revealing purulent fluid.  This fluid was swabbed for culture and sent to micro.  Small Kelly forceps were  used to dissect around the abscess tissue to open any remaining pockets of purulent fluid.  Qtip was used to probe the depth of the cavity, and it reach the deeper portion of the abdominal wall at the midline.  No stool, bile, or succus was visible, but the purulent fluid had foul odor.  After drainage was completed, the cavity was packed with wet to dry 4x4 gauze and covered with dry gauze and tape.  The patient tolerated the procedure well and all sharps were appropriately disposed of at the end of the case.  --Discussed again with the patient the possibility that this abscess cavity reaches deeper in to the space where the mesh is located.  Will obtain stat CT scan today to further evaluate.  If the mesh is indeed infected, he would need to be admitted for IV antibiotics and for surgical management in the form of exploratory laparotomy, mesh excision, abdominal wall reconstruction. --Will contact the patient as soon as CT scan is done and results available.  If no urgent issue, then he can follow up with me next week. --In the meantime, start on Augmentin  BID x 10 days.  Instructed patient and his significant other on how to do dressing changes.  --All questions have been answered.  I spent 30 minutes dedicated to the care of this patient on the date of this encounter to include pre-visit review of records, face-to-face time with the patient discussing diagnosis and management, and any post-visit coordination of care.   Aloysius Sheree Plant, MD [facility]

## 2024-06-06 DIAGNOSIS — T8579XA Infection and inflammatory reaction due to other internal prosthetic devices, implants and grafts, initial encounter: Secondary | ICD-10-CM | POA: Diagnosis not present

## 2024-06-06 DIAGNOSIS — L02211 Cutaneous abscess of abdominal wall: Secondary | ICD-10-CM

## 2024-06-06 LAB — CBC
HCT: 27.4 % — ABNORMAL LOW (ref 39.0–52.0)
Hemoglobin: 8.7 g/dL — ABNORMAL LOW (ref 13.0–17.0)
MCH: 26.5 pg (ref 26.0–34.0)
MCHC: 31.8 g/dL (ref 30.0–36.0)
MCV: 83.5 fL (ref 80.0–100.0)
Platelets: 218 K/uL (ref 150–400)
RBC: 3.28 MIL/uL — ABNORMAL LOW (ref 4.22–5.81)
RDW: 13.4 % (ref 11.5–15.5)
WBC: 7 K/uL (ref 4.0–10.5)
nRBC: 0 % (ref 0.0–0.2)

## 2024-06-06 LAB — BASIC METABOLIC PANEL WITH GFR
Anion gap: 8 (ref 5–15)
BUN: 32 mg/dL — ABNORMAL HIGH (ref 8–23)
CO2: 23 mmol/L (ref 22–32)
Calcium: 8.6 mg/dL — ABNORMAL LOW (ref 8.9–10.3)
Chloride: 102 mmol/L (ref 98–111)
Creatinine, Ser: 2.4 mg/dL — ABNORMAL HIGH (ref 0.61–1.24)
GFR, Estimated: 26 mL/min — ABNORMAL LOW (ref >60)
Glucose, Bld: 114 mg/dL — ABNORMAL HIGH (ref 70–99)
Potassium: 4.1 mmol/L (ref 3.5–5.1)
Sodium: 133 mmol/L — ABNORMAL LOW (ref 135–145)

## 2024-06-06 LAB — MAGNESIUM: Magnesium: 2.2 mg/dL (ref 1.7–2.4)

## 2024-06-06 LAB — TYPE AND SCREEN
ABO/RH(D): B POS
Antibody Screen: NEGATIVE

## 2024-06-06 MED ORDER — SODIUM CHLORIDE 0.9 % IV SOLN
2.0000 g | INTRAVENOUS | Status: AC
  Administered 2024-06-07 (×2): 2 g via INTRAVENOUS
  Filled 2024-06-06: qty 2

## 2024-06-06 MED ORDER — SODIUM CHLORIDE 0.9 % IV SOLN: INTRAVENOUS | Status: AC

## 2024-06-06 NOTE — TOC CM/SW Note (Signed)
 Transition of Care [facility]) - Inpatient Brief Assessment   Patient Details  Name: Stephen Cervantes MRN: [id number] Date of Birth: 1941-06-03  Transition of Care [facility]) CM/SW Contact:    Corean ONEIDA Haddock, RN Phone Number: 06/06/2024, 10:01 AM   Clinical Narrative:  Transition of Care [facility]) Screening Note   Patient Details  Name: Stephen Cervantes Date of Birth: 1941-05-19   Transition of Care [facility]) CM/SW Contact:    Corean ONEIDA Haddock, RN Phone Number: 06/06/2024, 10:01 AM    Transition of Care Department [facility]) has reviewed patient and no TOC needs have been identified at this time. . If new patient transition needs arise, please place a TOC consult.     Transition of Care Asessment: Insurance and Status: Insurance coverage has been reviewed Patient has primary care physician: Yes     Prior/Current Home Services: No current home services Social Drivers of Health Review: SDOH reviewed no interventions necessary Readmission risk has been reviewed: Yes Transition of care needs: no transition of care needs at this time

## 2024-06-06 NOTE — Progress Notes (Addendum)
 Patient seen and evaluated with Mr. Terryl and agree with his note.  Patietn admitted yesterday after CT scan revealed intra-abdominal abscess at the level of the mesh from prior ventral hernia repair.  Discussed with him plan for tomorrow for OR -- exploratory laparotomy, excision of infected mesh, lysis of adhesions, and abdominal wall reconstruction with myocutaneous flaps.  Discussed planned incision, risks of bleeding, infection, injury to surrounding structures, possible post-op issues with bowel ileus, fistulas, hospital stay, pain control, and he's willing to proceed.  Will go to OR tomorrow.  NPO after midnight.  Hydrate today given AKI -- combination of dehydration, infection, and IV contrast.  Aloysius Plant, MD     [facility] SURGICAL PROGRESS NOTE (cpt [phone])  Hospital Day(s): 1.   Interval History: Patient seen and examined, no acute events or new complaints overnight. Patient reports he is feeling pretty good. No fever, chills, nausea, emesis. Previous leukocytosis resolved; WBC 7.0K this AM. Hgb to 8.7; suspect this decrease is dilutional. AKI with sCr - 2.40; UO - no I/O recorded. He is on regular diet. Continues on Zosyn. Plan for abdominal wall reconstruction tomorrow.   Review of Systems:  Constitutional: denies fever, chills  HEENT: denies cough or congestion  Respiratory: denies any shortness of breath  Cardiovascular: denies chest pain or palpitations  Gastrointestinal: denies abdominal pain, N/V, Genitourinary: denies burning with urination or urinary frequency Musculoskeletal: denies pain, decreased motor or sensation Integumentary: + erythema  Vital signs in last 24 hours: [min-max] current  Temp:  [97.6 F (36.4 C)-98.3 F (36.8 C)] 97.6 F (36.4 C) (10/02 0433) Pulse Rate:  [59-103] 59 (10/02 0433) Resp:  [16] 16 (10/02 0433) BP: (99-138)/(56-76) 114/61 (10/02 0433) SpO2:  [95 %-100 %] 96 % (10/02 0433) Weight:  [79.3 kg] 79.3 kg (10/01  0838)             Intake/Output last 2 shifts:  No intake/output data recorded.   Physical Exam:  Constitutional: alert, cooperative and no distress  HENT: normocephalic without obvious abnormality  Eyes: PERRL, EOM's grossly intact and symmetric  Respiratory: breathing non-labored at rest  Cardiovascular: regular rate and sinus rhythm  Gastrointestinal: soft, non-tender, and non-distended Integumentary: Noted I&D site to the mid abdomen, erythema improving, this continues to have foul smelling drainage    Labs:     Latest Ref Rng & Units 06/06/2024    3:50 AM 06/05/2024    6:51 PM 03/29/2024    3:04 PM  CBC  WBC 4.0 - 10.5 K/uL 7.0  11.1  9.1   Hemoglobin 13.0 - 17.0 g/dL 8.7  89.2  86.9   Hematocrit 39.0 - 52.0 % 27.4  33.2  40.8   Platelets 150 - 400 K/uL 218  308  266       Latest Ref Rng & Units 06/06/2024    3:50 AM 06/05/2024    6:51 PM 03/29/2024    3:04 PM  CMP  Glucose 70 - 99 mg/dL 885  95  881   BUN 8 - 23 mg/dL 32  28  25   Creatinine 0.61 - 1.24 mg/dL 7.59  8.09  8.47   Sodium 135 - 145 mmol/L 133  133  141   Potassium 3.5 - 5.1 mmol/L 4.1  4.3  4.3   Chloride 98 - 111 mmol/L 102  99  105   CO2 22 - 32 mmol/L 23  23  26    Calcium  8.9 - 10.3 mg/dL 8.6  9.6  9.7  Total Protein 6.5 - 8.1 g/dL  7.3  7.1   Total Bilirubin 0.0 - 1.2 mg/dL  0.8  0.6   Alkaline Phos 38 - 126 U/L  150  378   AST 15 - 41 U/L  24  50   ALT 0 - 44 U/L  14  50      Imaging studies: No new pertinent imaging studies   Assessment/Plan:  83 y.o. male with intra-abdominal abscess and mesh infection.    - Plan for laparotomy, mesh excision, and abdominal wall reconstruction tomorrow (10/03) with Dr Lorain  - All risks, benefits, and alternatives to above procedure(s) were discussed with the patient, all of his questions were answered to his expressed satisfaction, patient expresses he wishes to proceed, and informed consent was obtained.   - Okay for diet today; NPO at midnight  - I  did add NS at 100 ml/hr given rising sCr, history of colectomy so at increased risk of dehydration  - Continue IV Abx; Cx pending  - Wound Care: Continue to pack abdominal wall wound daily, change superficial dressing as needed - Monitor abdominal examination - Pain control prn; antiemetics prn - Morning labs   - Okay to mobilize   All of the above findings and recommendations were discussed with the patient, and the medical team, and all of patient's questions were answered to his expressed satisfaction.  -- Arthea Platt, PA-C [facility] 06/06/2024, 7:42 AM M-F: 7am - 4pm

## 2024-06-06 NOTE — Plan of Care (Signed)

## 2024-06-07 ENCOUNTER — Encounter
Admission: AD | Disposition: A | Discharge status: Home / Self Care | Payer: Self-pay | Source: Ambulatory Visit | Attending: Surgery

## 2024-06-07 ENCOUNTER — Inpatient Hospital Stay

## 2024-06-07 ENCOUNTER — Encounter: Payer: Self-pay | Admitting: Surgery

## 2024-06-07 ENCOUNTER — Other Ambulatory Visit: Payer: Self-pay

## 2024-06-07 ENCOUNTER — Inpatient Hospital Stay: Admission: RE | Admit: 2024-06-07 | Source: Home / Self Care | Admitting: Surgery

## 2024-06-07 DIAGNOSIS — K8020 Calculus of gallbladder without cholecystitis without obstruction: Secondary | ICD-10-CM

## 2024-06-07 HISTORY — PX: BOWEL RESECTION: SHX1257

## 2024-06-07 HISTORY — PX: CHOLECYSTECTOMY: SHX55

## 2024-06-07 HISTORY — PX: EXCISION OF MESH: SHX6268

## 2024-06-07 HISTORY — PX: ABDOMINAL WALL DEFECT REPAIR: SHX53

## 2024-06-07 LAB — CBC
HCT: 30.2 % — ABNORMAL LOW (ref 39.0–52.0)
Hemoglobin: 9.6 g/dL — ABNORMAL LOW (ref 13.0–17.0)
MCH: 25.8 pg — ABNORMAL LOW (ref 26.0–34.0)
MCHC: 31.8 g/dL (ref 30.0–36.0)
MCV: 81.2 fL (ref 80.0–100.0)
Platelets: 213 K/uL (ref 150–400)
RBC: 3.72 MIL/uL — ABNORMAL LOW (ref 4.22–5.81)
RDW: 13.3 % (ref 11.5–15.5)
WBC: 5.1 K/uL (ref 4.0–10.5)
nRBC: 0 % (ref 0.0–0.2)

## 2024-06-07 LAB — BASIC METABOLIC PANEL WITH GFR
Anion gap: 8 (ref 5–15)
BUN: 25 mg/dL — ABNORMAL HIGH (ref 8–23)
CO2: 23 mmol/L (ref 22–32)
Calcium: 8.8 mg/dL — ABNORMAL LOW (ref 8.9–10.3)
Chloride: 107 mmol/L (ref 98–111)
Creatinine, Ser: 1.96 mg/dL — ABNORMAL HIGH (ref 0.61–1.24)
GFR, Estimated: 33 mL/min — ABNORMAL LOW (ref >60)
Glucose, Bld: 97 mg/dL (ref 70–99)
Potassium: 4.1 mmol/L (ref 3.5–5.1)
Sodium: 138 mmol/L (ref 135–145)

## 2024-06-07 LAB — MAGNESIUM: Magnesium: 1.9 mg/dL (ref 1.7–2.4)

## 2024-06-07 SURGERY — REPAIR, ABDOMINAL WALL
Anesthesia: General

## 2024-06-07 MED ORDER — PROPOFOL 1000 MG/100ML IV EMUL
INTRAVENOUS | Status: AC
  Filled 2024-06-07: qty 100

## 2024-06-07 MED ORDER — FENTANYL CITRATE (PF) 100 MCG/2ML IJ SOLN
INTRAMUSCULAR | Status: DC | PRN
  Administered 2024-06-07 (×5): 50 ug via INTRAVENOUS

## 2024-06-07 MED ORDER — LIDOCAINE HCL (CARDIAC) PF 100 MG/5ML IV SOSY
PREFILLED_SYRINGE | INTRAVENOUS | Status: DC | PRN
  Administered 2024-06-07: 100 mg via INTRAVENOUS

## 2024-06-07 MED ORDER — ACETAMINOPHEN 10 MG/ML IV SOLN
INTRAVENOUS | Status: AC
  Filled 2024-06-07: qty 100

## 2024-06-07 MED ORDER — BUPIVACAINE-EPINEPHRINE (PF) 0.25% -1:200000 IJ SOLN
INTRAMUSCULAR | Status: AC
  Filled 2024-06-07: qty 30

## 2024-06-07 MED ORDER — LIDOCAINE HCL (PF) 2 % IJ SOLN
INTRAMUSCULAR | Status: AC
  Filled 2024-06-07: qty 5

## 2024-06-07 MED ORDER — DEXAMETHASONE SODIUM PHOSPHATE 10 MG/ML IJ SOLN
INTRAMUSCULAR | Status: AC
  Filled 2024-06-07: qty 1

## 2024-06-07 MED ORDER — ONDANSETRON HCL 4 MG/2ML IJ SOLN
INTRAMUSCULAR | Status: AC
  Filled 2024-06-07: qty 2

## 2024-06-07 MED ORDER — HYDROMORPHONE HCL 1 MG/ML IJ SOLN: 0.2500 mg | INTRAMUSCULAR | Status: DC | PRN

## 2024-06-07 MED ORDER — PROPOFOL 10 MG/ML IV BOLUS
INTRAVENOUS | Status: DC | PRN
  Administered 2024-06-07 (×2): 100 ug/kg/min via INTRAVENOUS
  Administered 2024-06-07: 100 mg via INTRAVENOUS
  Administered 2024-06-07: 100 ug/kg/min via INTRAVENOUS

## 2024-06-07 MED ORDER — ONDANSETRON HCL 4 MG/2ML IJ SOLN
INTRAMUSCULAR | Status: DC | PRN
  Administered 2024-06-07 (×2): 4 mg via INTRAVENOUS

## 2024-06-07 MED ORDER — ROCURONIUM BROMIDE 10 MG/ML (PF) SYRINGE
PREFILLED_SYRINGE | INTRAVENOUS | Status: AC
  Filled 2024-06-07: qty 10

## 2024-06-07 MED ORDER — LACTATED RINGERS IV SOLN: INTRAVENOUS | Status: DC | PRN

## 2024-06-07 MED ORDER — ALBUMIN HUMAN 5 % IV SOLN: INTRAVENOUS | Status: DC | PRN

## 2024-06-07 MED ORDER — ALBUMIN HUMAN 5 % IV SOLN
INTRAVENOUS | Status: AC
  Filled 2024-06-07: qty 500

## 2024-06-07 MED ORDER — SODIUM CHLORIDE 0.9 % IV SOLN: INTRAVENOUS | Status: AC

## 2024-06-07 MED ORDER — HEPARIN SODIUM (PORCINE) 5000 UNIT/ML IJ SOLN
5000.0000 [IU] | Freq: Three times a day (TID) | INTRAMUSCULAR | Status: DC
  Administered 2024-06-08 – 2024-06-10 (×7): 5000 [IU] via SUBCUTANEOUS
  Filled 2024-06-07 (×7): qty 1

## 2024-06-07 MED ORDER — ROCURONIUM BROMIDE 100 MG/10ML IV SOLN
INTRAVENOUS | Status: DC | PRN
  Administered 2024-06-07: 20 mg via INTRAVENOUS
  Administered 2024-06-07 (×2): 30 mg via INTRAVENOUS
  Administered 2024-06-07 (×2): 50 mg via INTRAVENOUS

## 2024-06-07 MED ORDER — BUPIVACAINE LIPOSOME 1.3 % IJ SUSP
INTRAMUSCULAR | Status: AC
  Filled 2024-06-07: qty 20

## 2024-06-07 MED ORDER — SODIUM CHLORIDE (PF) 0.9 % IJ SOLN
INTRAMUSCULAR | Status: AC
Start: 2024-06-07 — End: 2024-06-07
  Filled 2024-06-07: qty 50

## 2024-06-07 MED ORDER — SODIUM CHLORIDE 0.9 % IV SOLN
INTRAVENOUS | Status: AC
  Filled 2024-06-07: qty 2

## 2024-06-07 MED ORDER — PHENYLEPHRINE HCL-NACL 20-0.9 MG/250ML-% IV SOLN
INTRAVENOUS | Status: AC
  Filled 2024-06-07: qty 250

## 2024-06-07 MED ORDER — HYDROMORPHONE HCL 1 MG/ML IJ SOLN
0.5000 mg | INTRAMUSCULAR | Status: DC | PRN
  Administered 2024-06-07 – 2024-06-11 (×5): 0.5 mg via INTRAVENOUS
  Filled 2024-06-07 (×5): qty 0.5

## 2024-06-07 MED ORDER — SUGAMMADEX SODIUM 200 MG/2ML IV SOLN
INTRAVENOUS | Status: DC | PRN
  Administered 2024-06-07: 200 mg via INTRAVENOUS

## 2024-06-07 MED ORDER — DEXAMETHASONE SODIUM PHOSPHATE 10 MG/ML IJ SOLN
INTRAMUSCULAR | Status: DC | PRN
  Administered 2024-06-07: 10 mg via INTRAVENOUS

## 2024-06-07 MED ORDER — PHENYLEPHRINE HCL-NACL 20-0.9 MG/250ML-% IV SOLN
INTRAVENOUS | Status: DC | PRN
  Administered 2024-06-07: 30 ug/min via INTRAVENOUS

## 2024-06-07 MED ORDER — PHENYLEPHRINE 80 MCG/ML (10ML) SYRINGE FOR IV PUSH (FOR BLOOD PRESSURE SUPPORT)
PREFILLED_SYRINGE | INTRAVENOUS | Status: DC | PRN
  Administered 2024-06-07 (×2): 80 ug via INTRAVENOUS
  Administered 2024-06-07: 160 ug via INTRAVENOUS
  Administered 2024-06-07 (×2): 80 ug via INTRAVENOUS
  Administered 2024-06-07: 160 ug via INTRAVENOUS

## 2024-06-07 MED ORDER — SODIUM CHLORIDE (PF) 0.9 % IJ SOLN
INTRAMUSCULAR | Status: DC | PRN
  Administered 2024-06-07: 100 mL via INTRAMUSCULAR

## 2024-06-07 MED ORDER — IRRISEPT - 450ML BOTTLE WITH 0.05% CHG IN STERILE WATER, USP 99.95% OPTIME
TOPICAL | Status: DC | PRN
  Administered 2024-06-07: 450 mL

## 2024-06-07 MED ORDER — PHENYLEPHRINE 80 MCG/ML (10ML) SYRINGE FOR IV PUSH (FOR BLOOD PRESSURE SUPPORT)
PREFILLED_SYRINGE | INTRAVENOUS | Status: AC
  Filled 2024-06-07: qty 10

## 2024-06-07 MED ORDER — ROCURONIUM BROMIDE 10 MG/ML (PF) SYRINGE
PREFILLED_SYRINGE | INTRAVENOUS | Status: AC
Start: 2024-06-07 — End: 2024-06-07
  Filled 2024-06-07: qty 10

## 2024-06-07 MED ORDER — FENTANYL CITRATE (PF) 250 MCG/5ML IJ SOLN
INTRAMUSCULAR | Status: AC
  Filled 2024-06-07: qty 5

## 2024-06-07 MED ORDER — DROPERIDOL 2.5 MG/ML IJ SOLN: 0.6250 mg | Freq: Once | INTRAMUSCULAR | Status: DC | PRN

## 2024-06-07 MED ORDER — ACETAMINOPHEN 10 MG/ML IV SOLN
INTRAVENOUS | Status: DC | PRN
  Administered 2024-06-07: 1000 mg via INTRAVENOUS

## 2024-06-07 MED ORDER — ACETAMINOPHEN 10 MG/ML IV SOLN: 1000.0000 mg | Freq: Once | INTRAVENOUS | Status: DC | PRN

## 2024-06-07 MED ORDER — KETOROLAC TROMETHAMINE 30 MG/ML IJ SOLN
15.0000 mg | Freq: Four times a day (QID) | INTRAMUSCULAR | Status: DC
  Administered 2024-06-08 – 2024-06-10 (×9): 15 mg via INTRAVENOUS
  Filled 2024-06-07 (×9): qty 1

## 2024-06-07 MED ORDER — SODIUM CHLORIDE 0.9 % IV SOLN: INTRAVENOUS | Status: DC | PRN

## 2024-06-07 SURGICAL SUPPLY — 42 items
BLADE CLIPPER SURG (BLADE) ×2 IMPLANT
CLIP APPLIE 5 13 M/L LIGAMAX5 (MISCELLANEOUS) IMPLANT
DRAIN CHANNEL JP 19F RND 3/16 (MISCELLANEOUS) IMPLANT
DRAPE LAPAROTOMY 100X77 ABD (DRAPES) ×2 IMPLANT
DRESSING PREVENA PLUS CUSTOM (GAUZE/BANDAGES/DRESSINGS) IMPLANT
DRSG TEGADERM 4X4.75 (GAUZE/BANDAGES/DRESSINGS) IMPLANT
ELECT BLADE 6.5 EXT (BLADE) ×2 IMPLANT
ELECT CAUTERY BLADE 6.4 (BLADE) ×2 IMPLANT
ELECTRODE REM PT RTRN 9FT ADLT (ELECTROSURGICAL) ×2 IMPLANT
EVACUATOR SILICONE 100CC (DRAIN) IMPLANT
GAUZE SPONGE 4X4 12PLY STRL (GAUZE/BANDAGES/DRESSINGS) IMPLANT
GLOVE BIO SURGEON STRL SZ7 (GLOVE) ×4 IMPLANT
GOWN STRL REUS W/ TWL LRG LVL3 (GOWN DISPOSABLE) ×6 IMPLANT
GRASPER SUT TROCAR 14GX15 (MISCELLANEOUS) IMPLANT
HANDLE YANKAUER SUCT BULB TIP (MISCELLANEOUS) IMPLANT
KIT TURNOVER KIT A (KITS) ×2 IMPLANT
LIGASURE IMPACT 36 18CM CVD LR (INSTRUMENTS) IMPLANT
MANIFOLD NEPTUNE II (INSTRUMENTS) ×2 IMPLANT
MESH PHASIX 12X14 POSITON DEVC (Mesh General) IMPLANT
NDL HYPO 22X1.5 SAFETY MO (MISCELLANEOUS) ×2 IMPLANT
NDL MAYO 6 CRC TAPER PT (NEEDLE) IMPLANT
NEEDLE HYPO 22X1.5 SAFETY MO (MISCELLANEOUS) ×2 IMPLANT
NEEDLE MAYO 6 CRC TAPER PT (NEEDLE) ×2 IMPLANT
PACK BASIN MAJOR ARMC (MISCELLANEOUS) ×2 IMPLANT
PACKING GAUZE IODOFORM 1INX5YD (GAUZE/BANDAGES/DRESSINGS) IMPLANT
RELOAD STAPLE 75 3.8 BLU REG (ENDOMECHANICALS) IMPLANT
SPONGE KITTNER 5P (MISCELLANEOUS) ×2 IMPLANT
SPONGE T-LAP 18X18 ~~LOC~~+RFID (SPONGE) ×4 IMPLANT
SPONGE T-LAP 18X36 ~~LOC~~+RFID STR (SPONGE) IMPLANT
STAPLER PROXIMATE 75MM BLUE (STAPLE) IMPLANT
STAPLER SKIN PROX 35W (STAPLE) ×2 IMPLANT
SUT PDS AB 0 CT1 27 (SUTURE) ×6 IMPLANT
SUT SILK 2 0 SH CR/8 (SUTURE) IMPLANT
SUT SILK 2-0 18XBRD TIE 12 (SUTURE) ×2 IMPLANT
SUT SILK 3 0 SH CR/8 (SUTURE) IMPLANT
SUT VIC AB 2-0 SH 27XBRD (SUTURE) IMPLANT
SUT VICRYL+ 3-0 36IN CT-1 (SUTURE) IMPLANT
SUTURE EHLN 3-0 FS-10 30 BLK (SUTURE) IMPLANT
SWAB CULTURE ESWAB REG 1ML (MISCELLANEOUS) IMPLANT
SYR 20ML LL LF (SYRINGE) ×4 IMPLANT
TRAP FLUID SMOKE EVACUATOR (MISCELLANEOUS) ×2 IMPLANT
TRAY FOLEY SLVR 16FR LF STAT (SET/KITS/TRAYS/PACK) ×2 IMPLANT

## 2024-06-07 NOTE — Anesthesia Procedure Notes (Signed)
 Procedure Name: Intubation Date/Time: 06/07/2024 7:51 AM  Performed by: Bonnetta Jimmey SAUNDERS, CRNAPre-anesthesia Checklist: Patient identified, Emergency Drugs available, Suction available and Patient being monitored Patient Re-evaluated:Patient Re-evaluated prior to induction Oxygen Delivery Method: Circle system utilized Preoxygenation: Pre-oxygenation with 100% oxygen Induction Type: IV induction Ventilation: Mask ventilation without difficulty Laryngoscope Size: McGrath and 4 Grade View: Grade I Tube type: Oral Tube size: 7.0 mm Number of attempts: 1 Airway Equipment and Method: Stylet and Oral airway Placement Confirmation: ETT inserted through vocal cords under direct vision, positive ETCO2 and breath sounds checked- equal and bilateral Secured at: 20 cm Tube secured with: Tape Dental Injury: Teeth and Oropharynx as per pre-operative assessment

## 2024-06-07 NOTE — Transfer of Care (Signed)
 Immediate Anesthesia Transfer of Care Note  Patient: Stephen Cervantes  Procedure(s) Performed: REPAIR, ABDOMINAL WALL REMOVAL, MESH, ABDOMEN OR PELVIS CHOLECYSTECTOMY EXCISION, SMALL INTESTINE  Patient Location: PACU  Anesthesia Type:General  Level of Consciousness: sedated and drowsy  Airway & Oxygen Therapy: Patient Spontanous Breathing and Patient connected to face mask oxygen  Post-op Assessment: Report given to RN and Post -op Vital signs reviewed and stable  Post vital signs: Reviewed and stable  Last Vitals:  Vitals Value Taken Time  BP 127/66 06/07/24 15:38  Temp 36.4 C 06/07/24 15:38  Pulse 89 06/07/24 15:42  Resp 21 06/07/24 15:42  SpO2 100 % 06/07/24 15:42  Vitals shown include unfiled device data.  Last Pain:  Vitals:   06/07/24 0701  TempSrc:   PainSc: 0-No pain         Complications: There were no known notable events for this encounter.

## 2024-06-07 NOTE — Anesthesia Preprocedure Evaluation (Addendum)
 Anesthesia Evaluation  Patient identified by MRN, date of birth, ID band Patient awake    Reviewed: Allergy & Precautions, NPO status , Patient's Chart, lab work & pertinent test results  History of Anesthesia Complications Negative for: history of anesthetic complications  Airway Mallampati: III  TM Distance: <3 FB Neck ROM: full    Dental  (+) Chipped   Pulmonary neg shortness of breath, asthma , former smoker   Pulmonary exam normal        Cardiovascular Exercise Tolerance: Good hypertension, (-) angina Normal cardiovascular exam+ dysrhythmias (iRBBB,)      Neuro/Psych    GI/Hepatic negative GI ROS, Neg liver ROS,neg GERD  ,,  Endo/Other  Hypothyroidism    Renal/GU Renal InsufficiencyRenal disease  negative genitourinary   Musculoskeletal   Abdominal Normal abdominal exam  (+)   Peds  Hematology  (+) Blood dyscrasia, anemia   Anesthesia Other Findings Past Medical History: No date: Arthritis No date: Asthma 2011,2015: Bowel obstruction (HCC) No date: Depression 2006: Diverticulitis No date: Enlarged prostate 07/04/2014: Gallstones No date: History of gastrointestinal ulcer No date: History of kidney stones No date: HLD (hyperlipidemia) No date: HTN (hypertension) No date: Kidney stone     Comment:  kidney stones 05/29/2015: Neuritis or radiculitis due to rupture of lumbar  intervertebral disc No date: Thyroid disease  Past Surgical History: 09/05/2004: COLECTOMY     Comment:  Abdominal colectomy, ileostomy, Hartman procedure for               perforation of cecum, sigmoid diverticulitis 2008?: HERNIA REPAIR     Comment:  [place] [facility] 09/05/2005: ILEOSTOMY 09/05/2012: JOINT REPLACEMENT     Comment:  Hip Replacement [name] 09/05/2009: KNEE SURGERY; Right 03/13/2019: POSTERIOR LUMBAR FUSION 4 LEVEL; N/A     Comment:  Procedure: POSTERIOR LUMBAR FUSION 4 LEVEL L1-5;                 Surgeon: Clois Fret, MD;  Location: [facility] ORS;                Service: Neurosurgery;  Laterality: N/A; 05/05/2021: TRIGGER FINGER RELEASE; Right     Comment:  Procedure: RELEASE TRIGGER FINGER/A-1 PULLEY;  Surgeon:               Edie Norleen PARAS, MD;  Location: [facility] ORS;  Service:               Orthopedics;  Laterality: Right;  BMI    Body Mass Index: 25.84 kg/m      Reproductive/Obstetrics negative OB ROS                              Anesthesia Physical Anesthesia Plan  ASA: 3  Anesthesia Plan: General   Post-op Pain Management: Ofirmev  IV (intra-op)* and Ketamine  IV*   Induction: Intravenous  PONV Risk Score and Plan: 3 and Dexamethasone , Ondansetron  and Midazolam  Airway Management Planned: Oral ETT  Additional Equipment:   Intra-op Plan:   Post-operative Plan: Extubation in OR  Informed Consent: I have reviewed the patients History and Physical, chart, labs and discussed the procedure including the risks, benefits and alternatives for the proposed anesthesia with the patient or authorized representative who has indicated his/her understanding and acceptance.     Dental Advisory Given  Plan Discussed with: Anesthesiologist, CRNA and Surgeon  Anesthesia Plan Comments:          Anesthesia Quick Evaluation

## 2024-06-07 NOTE — Op Note (Addendum)
 Procedure Date:  06/07/2024  Pre-operative Diagnosis:  Infected mesh with recurrent ventral hernia, cholelithiasis  Post-operative Diagnosis: Infected mesh with recurrent ventral hernia, cholelithiasis  Procedure:  Exploratory Laparotomy, extensive lysis of adhesions, excision of infected mesh, debridement of midline skin wound/abscess cavity for size 15 x 3 cm, small bowel resection with anastomosis, open cholecystectomy, bladder repair, recurrent ventral hernia repair with bilateral myocutaneous flaps with anterior release.  Surgeon:  Aloysius Sheree Plant, MD  Assistant:  Laneta Luna, MD; Arthea Platt, PA-C  Anesthesia:  General endotracheal  Estimated Blood Loss:  300 ml  Specimens:   Infected mesh Small bowel Gallbladder  Complications:  Bladder injury, which was repaired primarily.  Indications for Procedure:  This is a 83 y.o. male who presents with abdominal pain and abdominal wall abscess tracking from infected mesh.  The risks of bleeding, abscess or infection, injury to surrounding structures, and need for further procedures were all discussed with the patient and was willing to proceed.  Description of Procedure: The patient was correctly identified in the preoperative area and brought into the operating room.  The patient was placed supine with VTE prophylaxis in place.  Appropriate time-outs were performed.  Anesthesia was induced and the patient was intubated.  Foley catheter was placed.  Appropriate antibiotics were infused.  NG tube was inserted.  The abdomen was prepped and draped in a sterile fashion.  A midline incision was made, performing excisional sharp debridement of the patient's prior midline scar and the cutaneous aspect of the abdominal wall abscess and abscess cavity.   Some of this tissue was sent for culture.  A culture swab was also obtained of the fluid.  Cautery was used to dissect down the subcutaneous tissue to the fascia.  The fascia was incised in  different aspects to help us  enter the abdomen above and below the prior mesh.  When trying to enter the inferior aspect of the mesh/fascia, a bladder injury was noted, and this was repaired primarily using 2-0 Vicryl sutures in two layers.  There were significant adhesions of the small bowel and omentum to the anterior abdominal wall and to the mesh.  This required very tedious dissection using both cautery and blunt dissection, which required between 2-3 hours of adhesiolysis between bowel loops, between omentum and anterior abdominal wall and bowel loops, and between mesh and omentum and bowel (adding modifier 22 for this).  We reached the point in the mesh where the abscess was coming from.  Under the mesh, it was noted that the small bowel was adhered and eroded into the mesh, with bile staining of the underside of the mesh in this area.  While freeing the bowel from the mesh, there were a few serosal tears noted which were primarily reinforced using 2-0 Silk sutures.  After the bowel was freed from the mesh, the mesh was then freed from the posterior fascia using cautery.  There were multiple gaps/defects in the posterior fascia after removing the mesh.  The mesh was sent to pathology.  Further adhesions between bowel loops and between bowel and abdominal wall were taken down using cautery and blunt dissection.  After running the small bowel free, it was decided that the area that had eroded into the mesh was not feasible to repair due to significant inflammation and infection, and it was decided to perform a small bowel resection.  Windows in the mesentery were created and the bowel proximal and distal to this area was divided using GIA blue load  stapler.  The mesentery was taken using LigaSure.  The two ends of the small bowel were then anastomosed in side-to-side fashion using further GIA blue loads and the staple line imbricated using 2-0 Silk sutures.  The mesenteric defect was closed using 3-0 Silk.  The  patient also has history of cholelithiasis and had previously elevated LFTs, so it was also decided to perform a cholecystectomy.  The gallbladder was taken down via top-down approach.  The cystic duct was identified and clipped twice distally and once proximally, cutting in between.  The cystic artery was taken using LigaSure.  After this, we proceeded with creation of our myocutaneous flaps by mobilizing the fascia and muscle bilaterally off the subcutaneous tissue.  We then proceeding with bilateral anterior release of the fascia to facilitate approximation to the midline.  This allowed us  good approximation without tension.  The abdomen was then thoroughly irrigated.  A 19 Fr Blake drain was placed in the RLQ going to the pelvis.  100 ml of Exparel  mixture with 0.5% bupivacaine  and saline was infiltrated as bilateral TAP blocks.  A 30 x 35 cm Phasix ST OPS mesh was opened.  Multiple 2-0 PDS sutures were placed at the edges of the mesh for transfascial ties.  The mesh was inserted in intraperitoneal location, and we made sure to have it in good position, laying flat, without any intra-abdominal contents going over the mesh.  Using PMI, the mesh was secured with multiple transfascial ties.  Then the mesh was further secured using multiple absorbable tacks all around the mesh.  After this, the fascia was approximated and closed using multiple 0 PDS sutures, incorporating a layer of the mesh underneath with each bite to further secure the mesh in place.  Two more 19 Fr. Blake drains were inserted via the LUQ and LLQ to drain the anterior compartment above the fascia level.  The subcutaneous tissue was then tacked down to the fascia bilaterally in multiple places to decrease the amount of empty space.  The skin was then approximated using 2-0 Vicryl sutures, tacking the skin to the midline fascia at the same time.  Gaps were left within the midline wound to promote drainage.  Then a 1 inch iodoform gauze was inserted  into the gaps.  A Prevena vac was placed to cover the entire length of the wound, which measured about 35 x 2 cm.  The drains were secured in place using 3-0 Nylon sutures and dressed with 4x4 gauze and TegaDerm.  The patient was emerged from anesthesia and extubated and brought to the recovery room for further management.  The patient tolerated the procedure well and all counts were correct at the end of the case.  Dr. Jordis was scrubbed in for the entirety of the case and his assistance was critical due to the complexity of this case.  He assisted with all portions including incision, dissection, bladder repair, mesh excision, bowel resection, cholecystectomy, myocutaneous flaps, mesh placement, and fascial closure.  Mr. Terryl was also scrubbed in for the procedure and assisted in multiple points including flap creation, anterior release, mesh placement, fascial closure, and midline closure.  Aloysius Sheree Plant, MD

## 2024-06-07 NOTE — Plan of Care (Signed)
  Problem: Education: Goal: Knowledge of General Education information will improve Description: Including pain rating scale, medication(s)/side effects and non-pharmacologic comfort measures Outcome: Progressing   Problem: Health Behavior/Discharge Planning: Goal: Ability to manage health-related needs will improve Outcome: Progressing   Problem: Clinical Measurements: Goal: Ability to maintain clinical measurements within normal limits will improve Outcome: Progressing Goal: Will remain free from infection Outcome: Progressing Goal: Diagnostic test results will improve Outcome: Progressing Goal: Respiratory complications will improve Outcome: Progressing Goal: Cardiovascular complication will be avoided Outcome: Progressing   Problem: Coping: Goal: Level of anxiety will decrease Outcome: Progressing   Problem: Nutrition: Goal: Adequate nutrition will be maintained Outcome: Progressing   Problem: Elimination: Goal: Will not experience complications related to bowel motility Outcome: Progressing Goal: Will not experience complications related to urinary retention Outcome: Progressing   Problem: Pain Managment: Goal: General experience of comfort will improve and/or be controlled Outcome: Progressing   Problem: Safety: Goal: Ability to remain free from injury will improve Outcome: Progressing   Problem: Skin Integrity: Goal: Risk for impaired skin integrity will decrease Outcome: Progressing

## 2024-06-07 NOTE — Plan of Care (Signed)

## 2024-06-07 NOTE — Progress Notes (Signed)
 06/07/2024  Subjective: No acute events overnight.  Patient denies any worsening pain.  WBC remains normal and his Hgb is improved to 9.6.  His Cr is responding to fluid hydration, now down to 1.96.  Patient on schedule for surgery this morning.  Vital signs: Temp:  [98.1 F (36.7 C)-98.6 F (37 C)] 98.1 F (36.7 C) (10/03 0641) Pulse Rate:  [58-77] 77 (10/03 0641) Resp:  [15-17] 16 (10/03 0641) BP: (103-149)/(64-87) 149/87 (10/03 0641) SpO2:  [96 %-100 %] 100 % (10/03 0641) Weight:  [79.3 kg] 79.3 kg (10/03 0641)   Intake/Output: 10/02 0701 - 10/03 0700 In: 509 [P.O.:360; IV Piggyback:149] Out: -  Last BM Date : 06/06/24  Physical Exam: Constitutional:  No acute distress Abdomen:  soft, non-distended, appropriately tender.  Low midline wound with improving erythema, but still with drainage.  Labs:  Recent Labs    06/06/24 0350 06/07/24 0445  WBC 7.0 5.1  HGB 8.7* 9.6*  HCT 27.4* 30.2*  PLT 218 213   Recent Labs    06/06/24 0350 06/07/24 0445  NA 133* 138  K 4.1 4.1  CL 102 107  CO2 23 23  GLUCOSE 114* 97  BUN 32* 25*  CREATININE 2.40* 1.96*  CALCIUM  8.6* 8.8*   Recent Labs    06/05/24 1851  LABPROT 15.6*  INR 1.2    Imaging: No results found.  Assessment/Plan: This is a 83 y.o. male with infected mesh  --Discussed again with patient the plan for today with exploratory laparotomy, lysis of adhesions, mesh removal, and abdominal wall reconstruction.  Discussed with him and family at length and all questions have been answered.   I spent 25 minutes dedicated to the care of this patient on the date of this encounter to include pre-visit review of records, face-to-face time with the patient discussing diagnosis and management, and any post-visit coordination of care.  Aloysius Sheree Plant, MD [facility]

## 2024-06-07 NOTE — Interval H&P Note (Signed)
 History and Physical Interval Note:  06/07/2024 7:06 AM  Stephen Cervantes  has presented today for surgery, with the diagnosis of Excision of infected mesh.  The various methods of treatment have been discussed with the patient and family. After consideration of risks, benefits and other options for treatment, the patient has consented to  Procedure(s): REPAIR, ABDOMINAL WALL (N/A) REMOVAL, MESH, ABDOMEN OR PELVIS (N/A) as a surgical intervention.  The patient's history has been reviewed, patient examined, no change in status, stable for surgery.  I have reviewed the patient's chart and labs.  Questions were answered to the patient's satisfaction.     Royden Bulman

## 2024-06-08 LAB — BASIC METABOLIC PANEL WITH GFR
Anion gap: 9 (ref 5–15)
BUN: 23 mg/dL (ref 8–23)
CO2: 20 mmol/L — ABNORMAL LOW (ref 22–32)
Calcium: 8.2 mg/dL — ABNORMAL LOW (ref 8.9–10.3)
Chloride: 107 mmol/L (ref 98–111)
Creatinine, Ser: 1.58 mg/dL — ABNORMAL HIGH (ref 0.61–1.24)
GFR, Estimated: 43 mL/min — ABNORMAL LOW (ref >60)
Glucose, Bld: 158 mg/dL — ABNORMAL HIGH (ref 70–99)
Potassium: 5.1 mmol/L (ref 3.5–5.1)
Sodium: 136 mmol/L (ref 135–145)

## 2024-06-08 LAB — CBC
HCT: 27.3 % — ABNORMAL LOW (ref 39.0–52.0)
Hemoglobin: 8.8 g/dL — ABNORMAL LOW (ref 13.0–17.0)
MCH: 26 pg (ref 26.0–34.0)
MCHC: 32.2 g/dL (ref 30.0–36.0)
MCV: 80.5 fL (ref 80.0–100.0)
Platelets: 269 K/uL (ref 150–400)
RBC: 3.39 MIL/uL — ABNORMAL LOW (ref 4.22–5.81)
RDW: 13.2 % (ref 11.5–15.5)
WBC: 15 K/uL — ABNORMAL HIGH (ref 4.0–10.5)
nRBC: 0 % (ref 0.0–0.2)

## 2024-06-08 LAB — CREATININE, SERUM
Creatinine, Ser: 1.62 mg/dL — ABNORMAL HIGH (ref 0.61–1.24)
GFR, Estimated: 42 mL/min — ABNORMAL LOW (ref >60)

## 2024-06-08 LAB — MAGNESIUM: Magnesium: 1.4 mg/dL — ABNORMAL LOW (ref 1.7–2.4)

## 2024-06-08 MED ORDER — SODIUM CHLORIDE 0.9% FLUSH: 10.0000 mL | INTRAVENOUS | Status: DC | PRN

## 2024-06-08 MED ORDER — OXYCODONE HCL 5 MG/5ML PO SOLN: 5.0000 mg | ORAL | Status: DC | PRN

## 2024-06-08 MED ORDER — SODIUM CHLORIDE 0.9% FLUSH
10.0000 mL | Freq: Two times a day (BID) | INTRAVENOUS | Status: DC
  Administered 2024-06-08 – 2024-06-13 (×10): 10 mL

## 2024-06-08 MED ORDER — MAGNESIUM SULFATE 4 GM/100ML IV SOLN
4.0000 g | Freq: Once | INTRAVENOUS | Status: AC
  Administered 2024-06-08: 4 g via INTRAVENOUS
  Filled 2024-06-08: qty 100

## 2024-06-08 MED ORDER — METHOCARBAMOL 1000 MG/10ML IJ SOLN
500.0000 mg | Freq: Three times a day (TID) | INTRAMUSCULAR | Status: DC
  Administered 2024-06-08 – 2024-06-09 (×3): 500 mg via INTRAVENOUS
  Filled 2024-06-08 (×4): qty 5

## 2024-06-08 MED ORDER — ACETAMINOPHEN 10 MG/ML IV SOLN
1000.0000 mg | Freq: Four times a day (QID) | INTRAVENOUS | Status: AC
Start: 2024-06-08 — End: 2024-06-09
  Administered 2024-06-08 – 2024-06-09 (×4): 1000 mg via INTRAVENOUS
  Filled 2024-06-08 (×4): qty 100

## 2024-06-08 NOTE — Plan of Care (Signed)

## 2024-06-08 NOTE — Care Management Important Message (Signed)
 Important Message  Patient Details  Name: Stephen Cervantes MRN: [id number] Date of Birth: [birth year]   Important Message Given:  Yes - Medicare IM     Rojelio SHAUNNA Rattler 06/08/2024, 1:41 PM

## 2024-06-08 NOTE — Progress Notes (Signed)
 CC: POD 1  Subjective: Doing well considering No flatus Drains total 380   Objective: Vital signs in last 24 hours: Temp:  [97.5 F (36.4 C)-98.1 F (36.7 C)] 97.9 F (36.6 C) (10/04 0742) Pulse Rate:  [82-100] 83 (10/04 0742) Resp:  [16-20] 16 (10/04 0742) BP: (127-159)/(66-85) 138/79 (10/04 0742) SpO2:  [94 %-100 %] 99 % (10/04 0742) Last BM Date : 06/06/24  Intake/Output from previous day: 10/03 0701 - 10/04 0700 In: 4279.6 [I.V.:3303.6; NG/GT:30; IV Piggyback:901] Out: 2330 [Urine:1450; Emesis/NG output:150; Drains:380; Blood:300] Intake/Output this shift: Total I/O In: 50 [Other:50] Out: 340 [Urine:250; Drains:90]  Physical exam:  NAD alert Abd: wound vac in place good seal, three drain w serosanguinous drainage, no peritonitis  Lab Results: CBC  Recent Labs    06/07/24 0445 06/08/24 0621  WBC 5.1 15.0*  HGB 9.6* 8.8*  HCT 30.2* 27.3*  PLT 213 269   BMET Recent Labs    06/07/24 0445 06/08/24 0450 06/08/24 0451  NA 138 136  --   K 4.1 5.1  --   CL 107 107  --   CO2 23 20*  --   GLUCOSE 97 158*  --   BUN 25* 23  --   CREATININE 1.96* 1.58* 1.62*  CALCIUM  8.8* 8.2*  --    PT/INR Recent Labs    06/05/24 1851  LABPROT 15.6*  INR 1.2   ABG No results for input(s): PHART, HCO3 in the last 72 hours.  Invalid input(s): PCO2, PO2  Studies/Results: No results found.  Anti-infectives: Anti-infectives (From admission, onward)    Start     Dose/Rate Route Frequency Ordered Stop   06/07/24 0600  cefoTEtan (CEFOTAN) 2 g in sodium chloride  0.9 % 100 mL IVPB        2 g 200 mL/hr over 30 Minutes Intravenous On call to O.R. 06/06/24 1054 06/07/24 1401   06/05/24 1830  piperacillin-tazobactam (ZOSYN) IVPB 3.375 g        3.375 g 12.5 mL/hr over 240 Minutes Intravenous Every 8 hours 06/05/24 1803 06/12/24 1829       Assessment/Plan: Doing well IV acetaminophen  Keep foley until cystogram is done as outpt Keep foley Keep  ng Mobilize Continue a/bs Family and pt updated in detail    Laneta Luna, MD, FACS  06/08/2024

## 2024-06-08 NOTE — Evaluation (Signed)
 Physical Therapy Evaluation Patient Details Name: Stephen Cervantes MRN: [id number] DOB: [birth year] Today's Date: 06/08/2024  History of Present Illness  Stephen Cervantes is a 83 y.o. male presenting to clinic for follow up of upper abdominal pain.  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  It was determined that patient had infection along mesh and is s/p Exploratory Laparotomy, extensive lysis of adhesions, excision of infected mesh, small bowel resection with anastomosis, open cholecystectomy, bladder repair, recurrent ventral hernia repair with bilateral myocutaneous flaps with anterior release on 06/07/2024.  Clinical Impression  83 yo Male lives at home with his significant other. He reports being independent in all ADLs and IADLs prior to admittance. He does have steps to enter and was working from home. Patient is still driving. Currently he requires min A for bed mobility. He requires extra time/effort for positioning due to drains/wound vac and abdominal discomfort. Patient can sit edge of bed but does stabilize with upper extremity for balance. When sitting edge of bed on room air, SPO2 was 95%. He does report increased fatigue. Patient demonstrates LE weakness grossly 4/5. He was able to stand with RW support with min A for stability and to manage equipment. Patient took a few steps to bedside chair. Initially after transferring to bedside chair, Spo2 was 95% on room air. However after sitting in chair for a few minutes, Spo2 dropped to 88% on room air. PT reapplied 2L O2 nasal cannula support and SPo2 rebounded to 95% within 30 seconds. Patient does report increased fatigue and complained of dizziness after initially sitting in chair. While he does have a good support system at home with significant other, he does demonstrate increased fatigue which would make home  mobility including negotiating steps difficult. He would benefit from additional skilled PT Intervention including strengthening and balance training to increase safety with mobility.       If plan is discharge home, recommend the following: A lot of help with walking and/or transfers;A lot of help with bathing/dressing/bathroom;Assistance with cooking/housework;Assist for transportation;Help with stairs or ramp for entrance   Can travel by private vehicle   No    Equipment Recommendations Other (comment) (TBD)  Recommendations for Other Services       Functional Status Assessment Patient has had a recent decline in their functional status and demonstrates the ability to make significant improvements in function in a reasonable and predictable amount of time.     Precautions / Restrictions Precautions Precautions: None Precaution/Restrictions Comments: no recent falls Restrictions Weight Bearing Restrictions Per Provider Order: No      Mobility  Bed Mobility Overal bed mobility: Needs Assistance Bed Mobility: Supine to Sit     Supine to sit: Min assist, HOB elevated, Used rails     General bed mobility comments: required cues for positioning/managing drains/lines; Patient required extra time reporting increased abdominal discomfort; consider log roll next session    Transfers Overall transfer level: Needs assistance Equipment used: Rolling walker (2 wheels) Transfers: Sit to/from Stand Sit to Stand: Min assist           General transfer comment: Required cues for hand placement and therapist assist for stability as well as to manage lines/drains    Ambulation/Gait Ambulation/Gait assistance: Min assist Gait Distance (Feet): 4 Feet Assistive device: Rolling walker (2 wheels) Gait Pattern/deviations: Step-through pattern, Decreased step length - right, Decreased step  length - left, Trunk flexed Gait velocity: decreased     General Gait Details: Pt took a few  steps to bedside chair, requiring min A for stability and cues for positioning/safety in managing drains/lines;  Stairs            Wheelchair Mobility     Tilt Bed    Modified Rankin (Stroke Patients Only)       Balance Overall balance assessment: Needs assistance Sitting-balance support: Bilateral upper extremity supported, Feet supported Sitting balance-Leahy Scale: Fair Sitting balance - Comments: able to sit without back support, but rests hands on bed to help stabilize. Difficulty reaching outside base of support   Standing balance support: Bilateral upper extremity supported, Reliant on assistive device for balance Standing balance-Leahy Scale: Fair Standing balance comment: Patient able to stand with RW support, does require +1 physical assist to manage equipment                             Pertinent Vitals/Pain Pain Assessment Pain Assessment: No/denies pain    Home Living                          Prior Function                       Extremity/Trunk Assessment   Upper Extremity Assessment Upper Extremity Assessment: Overall WFL for tasks assessed    Lower Extremity Assessment Lower Extremity Assessment: Generalized weakness    Cervical / Trunk Assessment Cervical / Trunk Assessment: Normal  Communication   Communication Communication: No apparent difficulties    Cognition Arousal: Alert Behavior During Therapy: WFL for tasks assessed/performed   PT - Cognitive impairments: No apparent impairments                         Following commands: Intact       Cueing Cueing Techniques: Verbal cues     General Comments General comments (skin integrity, edema, etc.): has multiple drains and wound vac; abdominal dressings looked good after PT session;    Exercises Other Exercises Other Exercises: Educated patient in role of PT and recommendations; Also educated in importance of out of bed mobility as  tolerated   Assessment/Plan    PT Assessment Patient needs continued PT services  PT Problem List Decreased strength;Cardiopulmonary status limiting activity;Pain;Decreased activity tolerance;Decreased balance;Decreased safety awareness;Decreased mobility;Decreased skin integrity       PT Treatment Interventions DME instruction;Balance training;Gait training;Stair training;Functional mobility training;Patient/family education;Therapeutic activities;Therapeutic exercise    PT Goals (Current goals can be found in the Care Plan section)  Acute Rehab PT Goals Patient Stated Goal: to get stronger PT Goal Formulation: With patient Time For Goal Achievement: 06/22/24 Potential to Achieve Goals: Good    Frequency Min 1X/week     Co-evaluation               AM-PAC PT 6 Clicks Mobility  Outcome Measure Help needed turning from your back to your side while in a flat bed without using bedrails?: A Little Help needed moving from lying on your back to sitting on the side of a flat bed without using bedrails?: A Little Help needed moving to and from a bed to a chair (including a wheelchair)?: A Little Help needed standing up from a chair using your arms (e.g., wheelchair or bedside chair)?: A Little Help needed to  walk in hospital room?: A Lot Help needed climbing 3-5 steps with a railing? : A Lot 6 Click Score: 16    End of Session Equipment Utilized During Treatment: Gait belt Activity Tolerance: Patient limited by lethargy Patient left: in chair;with call bell/phone within reach;with family/visitor present Nurse Communication: Mobility status PT Visit Diagnosis: Unsteadiness on feet (R26.81);Muscle weakness (generalized) (M62.81)    Time: 8672-8585 PT Time Calculation (min) (ACUTE ONLY): 47 min   Charges:   PT Evaluation $PT Eval Low Complexity: 1 Low   PT General Charges $$ ACUTE PT VISIT: 1 Visit        Pierce Barocio PT, DPT 06/08/2024, 3:20 PM

## 2024-06-08 NOTE — Progress Notes (Signed)

## 2024-06-09 LAB — CBC
HCT: 21.1 % — ABNORMAL LOW (ref 39.0–52.0)
Hemoglobin: 7 g/dL — ABNORMAL LOW (ref 13.0–17.0)
MCH: 27 pg (ref 26.0–34.0)
MCHC: 33.2 g/dL (ref 30.0–36.0)
MCV: 81.5 fL (ref 80.0–100.0)
Platelets: 275 K/uL (ref 150–400)
RBC: 2.59 MIL/uL — ABNORMAL LOW (ref 4.22–5.81)
RDW: 13.8 % (ref 11.5–15.5)
WBC: 15.5 K/uL — ABNORMAL HIGH (ref 4.0–10.5)
nRBC: 0 % (ref 0.0–0.2)

## 2024-06-09 LAB — BASIC METABOLIC PANEL WITH GFR
Anion gap: 9 (ref 5–15)
BUN: 37 mg/dL — ABNORMAL HIGH (ref 8–23)
CO2: 20 mmol/L — ABNORMAL LOW (ref 22–32)
Calcium: 7.9 mg/dL — ABNORMAL LOW (ref 8.9–10.3)
Chloride: 109 mmol/L (ref 98–111)
Creatinine, Ser: 1.62 mg/dL — ABNORMAL HIGH (ref 0.61–1.24)
GFR, Estimated: 42 mL/min — ABNORMAL LOW (ref >60)
Glucose, Bld: 114 mg/dL — ABNORMAL HIGH (ref 70–99)
Potassium: 4.1 mmol/L (ref 3.5–5.1)
Sodium: 138 mmol/L (ref 135–145)

## 2024-06-09 LAB — MAGNESIUM: Magnesium: 2.8 mg/dL — ABNORMAL HIGH (ref 1.7–2.4)

## 2024-06-09 MED ORDER — METHOCARBAMOL 1000 MG/10ML IJ SOLN: 500.0000 mg | Freq: Three times a day (TID) | INTRAMUSCULAR | Status: DC | PRN

## 2024-06-09 MED ORDER — IRON SUCROSE 300 MG IVPB - SIMPLE MED
300.0000 mg | Freq: Once | Status: AC
Start: 2024-06-09 — End: 2024-06-09
  Administered 2024-06-09: 300 mg via INTRAVENOUS
  Filled 2024-06-09: qty 300

## 2024-06-09 NOTE — TOC Initial Note (Signed)
 Transition of Care [facility]) - Initial/Assessment Note    Patient Details  Name: Stephen Cervantes MRN: [id number] Date of Birth: 04-16-1941  Transition of Care [facility]) CM/SW Contact:    Victory Jackquline RAMAN, RN Phone Number: 06/09/2024, 12:27 PM  Clinical Narrative:    Chart reviewed. RNCM, spoke with the patient and wife at the bedside. I introduced myself, my role, and explained that discharge planning recommendations would be discussed. PT recommended Short Term Rehab and the patient and wife are in agreement with this. They are not familiar with the facilities in the area. I told them that I would submit him to the SNF's in th area and once we have bed offers they would be presented to him and he could make a decision at that time which one he wanted to go to.  Patient has walker, cane and shower chair at home. His wife will be picking him up at the time of discharge. FL2 completed, PASSR # [id number]. RNCM will continue to follow for discharge planning/care coordination and update as applicable.    Expected Discharge Plan: Skilled Nursing Facility Barriers to Discharge: Continued Medical Work up   Patient Goals and CMS Choice   CMS Medicare.gov Compare Post Acute Care list provided to:: Patient Choice offered to / list presented to : Patient, Spouse      Expected Discharge Plan and Services     Post Acute Care Choice: Skilled Nursing Facility Living arrangements for the past 2 months: Single Family Home                                      Prior Living Arrangements/Services Living arrangements for the past 2 months: Single Family Home Lives with:: Spouse Patient language and need for interpreter reviewed:: Yes Do you feel safe going back to the place where you live?: Yes      Need for Family Participation in Patient Care: Yes (Comment) Care giver support system in place?: Yes (comment) Current home services: DME Criminal Activity/Legal Involvement Pertinent to Current  Situation/Hospitalization: No - Comment as needed  Activities of Daily Living   ADL Screening (condition at time of admission) Independently performs ADLs?: Yes (appropriate for developmental age) Is the patient deaf or have difficulty hearing?: Yes Does the patient have difficulty seeing, even when wearing glasses/contacts?: No Does the patient have difficulty concentrating, remembering, or making decisions?: No  Permission Sought/Granted                  Emotional Assessment Appearance:: Appears stated age, Well-Groomed Attitude/Demeanor/Rapport: Gracious, Engaged Affect (typically observed): Calm, Quiet, Pleasant Orientation: : Oriented to Self, Oriented to Place, Oriented to  Time, Oriented to Situation Alcohol / Substance Use: Not Applicable Psych Involvement: No (comment)  Admission diagnosis:  Infected prosthetic mesh of abdominal wall [T85.79XA] Patient Active Problem List   Diagnosis Date Noted   Calculus of gallbladder without cholecystitis without obstruction 06/07/2024   Infected prosthetic mesh of abdominal wall 06/05/2024   HCAP (healthcare-associated pneumonia) 05/20/2019   Partial small bowel obstruction (HCC) 04/14/2019   S/P lumbar fusion 03/13/2019   Acquired trigger finger 06/28/2018   Epigastric pain 05/22/2017   History of duodenal ulcer 05/22/2017   S/P colectomy 05/22/2017   Transient ischemic attack (TIA) 01/31/2017   Kidney stones 08/26/2015   Left ureteral stone 08/16/2015   Hydronephrosis with urinary obstruction due to ureteral calculus 08/16/2015   Microscopic hematuria 08/16/2015  Neuritis or radiculitis due to rupture of lumbar intervertebral disc 05/29/2015   Left flank pain 07/04/2014   Gallstones 07/04/2014   Benign prostatic hyperplasia with urinary obstruction 01/09/2013   Benign localized hyperplasia of prostate with urinary obstruction and other lower urinary tract symptoms (LUTS)(600.21) 01/09/2013   Calculi, ureter 09/13/2012    Calculus of kidney 09/13/2012   Dempsey hematuria 09/13/2012   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 [place] [address] Phone: [phone] Fax: [phone]  [facility] [address] [place] [address] Phone: [phone] Fax: [phone]     Social Drivers of Health (SDOH) Social History: SDOH Screenings   Food Insecurity: No Food Insecurity (06/05/2024)  Housing: Low Risk  (06/05/2024)  Transportation Needs: No Transportation Needs (06/05/2024)  Utilities: Not At Risk (06/05/2024)  Financial Resource Strain: Low Risk  (05/15/2024)   Received from [facility]  Social Connections: Moderately Isolated (06/05/2024)  Tobacco Use: Medium Risk (06/07/2024)   SDOH Interventions:     Readmission Risk Interventions     No data to display

## 2024-06-09 NOTE — Plan of Care (Signed)
  Problem: Clinical Measurements: Goal: Ability to maintain clinical measurements within normal limits will improve Outcome: Progressing Goal: Will remain free from infection Outcome: Progressing Goal: Diagnostic test results will improve Outcome: Progressing Goal: Respiratory complications will improve Outcome: Progressing Goal: Cardiovascular complication will be avoided Outcome: Progressing   Problem: Activity: Goal: Risk for activity intolerance will decrease Outcome: Progressing   Problem: Elimination: Goal: Will not experience complications related to bowel motility Outcome: Progressing Goal: Will not experience complications related to urinary retention Outcome: Progressing   Problem: Safety: Goal: Ability to remain free from injury will improve Outcome: Progressing   Problem: Skin Integrity: Goal: Risk for impaired skin integrity will decrease Outcome: Progressing   Problem: Pain Managment: Goal: General experience of comfort will improve and/or be controlled Outcome: Progressing

## 2024-06-09 NOTE — Progress Notes (Signed)
 CC: POD # 2 Subjective: No flatus Some pain Robaxin  iv was too much now on prn basis Hb 7 Drains 406 intra-abdominal w greatest output   Objective: Vital signs in last 24 hours: Temp:  [97.6 F (36.4 C)-97.7 F (36.5 C)] 97.6 F (36.4 C) (10/05 0713) Pulse Rate:  [68-83] 68 (10/05 0713) Resp:  [17-20] 17 (10/05 0713) BP: (116-133)/(66-77) 125/66 (10/05 0713) SpO2:  [88 %-100 %] 100 % (10/05 0713) Last BM Date : 06/06/24  Intake/Output from previous day: 10/04 0701 - 10/05 0700 In: 2618.4 [P.O.:125; I.V.:1834.9; NG/GT:30; IV Piggyback:578.5] Out: 1720 [Urine:1200; Emesis/NG output:115; Drains:405] Intake/Output this shift: Total I/O In: -  Out: 60 [Drains:60]  Physical exam:  NAD alert Abd: wound vac in place good seal, three drain w serosanguinous drainage, no peritonitis Lab Results: CBC  Recent Labs    06/08/24 0621 06/09/24 0620  WBC 15.0* 15.5*  HGB 8.8* 7.0*  HCT 27.3* 21.1*  PLT 269 275   BMET Recent Labs    06/08/24 0450 06/08/24 0451 06/09/24 0620  NA 136  --  138  K 5.1  --  4.1  CL 107  --  109  CO2 20*  --  20*  GLUCOSE 158*  --  114*  BUN 23  --  37*  CREATININE 1.58* 1.62* 1.62*  CALCIUM  8.2*  --  7.9*   PT/INR No results for input(s): LABPROT, INR in the last 72 hours. ABG No results for input(s): PHART, HCO3 in the last 72 hours.  Invalid input(s): PCO2, PO2  Studies/Results: No results found.  Anti-infectives: Anti-infectives (From admission, onward)    Start     Dose/Rate Route Frequency Ordered Stop   06/07/24 0600  cefoTEtan (CEFOTAN) 2 g in sodium chloride  0.9 % 100 mL IVPB        2 g 200 mL/hr over 30 Minutes Intravenous On call to O.R. 06/06/24 1054 06/07/24 1401   06/05/24 1830  piperacillin-tazobactam (ZOSYN) IVPB 3.375 g        3.375 g 12.5 mL/hr over 240 Minutes Intravenous Every 8 hours 06/05/24 1803 06/12/24 1829       Assessment/Plan: Doing well overall Hb not unexpected , dilutional +  chronic dz, IV iron Recheck hb in am may need transfusion Keep ng and foley Mobilize Keep drains Labs in am    Coralyn Roselli, MD, FACS  06/09/2024

## 2024-06-09 NOTE — NC FL2 (Signed)
 [place]  MEDICAID FL2 LEVEL OF CARE FORM     IDENTIFICATION  Patient Name: Stephen Cervantes Birthdate: August 01, 1941 Sex: male Admission Date (Current Location): 06/05/2024  [facility] and [place] Number:  Chiropodist and Address:         Provider Number: [id number]  Attending Physician Name and Address:  Desiderio Schanz, MD  Relative Name and Phone Number:       Current Level of Care: Hospital Recommended Level of Care: Skilled Nursing Facility Prior Approval Number:    Date Approved/Denied:   PASRR Number: [id number]  Discharge Plan: SNF    Current Diagnoses: Patient Active Problem List   Diagnosis Date Noted   Calculus of gallbladder without cholecystitis without obstruction 06/07/2024   Infected prosthetic mesh of abdominal wall 06/05/2024   HCAP (healthcare-associated pneumonia) 05/20/2019   Partial small bowel obstruction (HCC) 04/14/2019   S/P lumbar fusion 03/13/2019   Acquired trigger finger 06/28/2018   Epigastric pain 05/22/2017   History of duodenal ulcer 05/22/2017   S/P colectomy 05/22/2017   Transient ischemic attack (TIA) 01/31/2017   Kidney stones 08/26/2015   Left ureteral stone 08/16/2015   Hydronephrosis with urinary obstruction due to ureteral calculus 08/16/2015   Microscopic hematuria 08/16/2015   Neuritis or radiculitis due to rupture of lumbar intervertebral disc 05/29/2015   Left flank pain 07/04/2014   Gallstones 07/04/2014   Benign prostatic hyperplasia with urinary obstruction 01/09/2013   Benign localized hyperplasia of prostate with urinary obstruction and other lower urinary tract symptoms (LUTS)(600.21) 01/09/2013   Calculi, ureter 09/13/2012   Calculus of kidney 09/13/2012   Dempsey hematuria 09/13/2012   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   Renal colic 09/13/2012   Essential hypertension 07/28/2008    Orientation RESPIRATION BLADDER Height & Weight     Self, Time, Situation, Place  Normal  Continent Weight: 79.3 kg Height:  5' 9 (175.3 cm)  BEHAVIORAL SYMPTOMS/MOOD NEUROLOGICAL BOWEL NUTRITION STATUS      Continent NG/panda  AMBULATORY STATUS COMMUNICATION OF NEEDS Skin   Limited Assist Verbally Surgical wounds                       Personal Care Assistance Level of Assistance  Bathing, Dressing Bathing Assistance: Limited assistance   Dressing Assistance: Limited assistance     Functional Limitations Info             SPECIAL CARE FACTORS FREQUENCY  OT (By licensed OT), PT (By licensed PT)     PT Frequency: 3 X/Week OT Frequency: 3 X/Week            Contractures Contractures Info: Not present    Additional Factors Info  Code Status Code Status Info: Full             Current Medications (06/09/2024):  This is the current hospital active medication list Current Facility-Administered Medications  Medication Dose Route Frequency Provider Last Rate Last Admin   0.9 %  sodium chloride  infusion   Intravenous Continuous Pabon, [place] F, MD 100 mL/hr at 06/08/24 2004 Restarted at 06/08/24 2004   finasteride  (PROSCAR ) tablet 5 mg  5 mg Oral Daily Piscoya, Jose, MD   5 mg at 06/09/24 1039   heparin injection 5,000 Units  5,000 Units Subcutaneous Q8H Piscoya, Jose, MD   5,000 Units at 06/09/24 0532   HYDROmorphone  (DILAUDID ) injection 0.5 mg  0.5 mg Intravenous Q2H PRN Desiderio Schanz, MD   0.5 mg at 06/08/24 [id number]  ketorolac  (TORADOL ) 30 MG/ML injection 15 mg  15 mg Intravenous Q6H Piscoya, Jose, MD   15 mg at 06/09/24 0522   levothyroxine  (SYNTHROID ) tablet 125 mcg  125 mcg Oral Daily Piscoya, Aloysius, MD   125 mcg at 06/09/24 0531   methocarbamol  (ROBAXIN ) injection 500 mg  500 mg Intravenous Q8H PRN Pabon, Diego F, MD       ondansetron  (ZOFRAN -ODT) disintegrating tablet 4 mg  4 mg Oral Q6H PRN Piscoya, Jose, MD       Or   ondansetron  (ZOFRAN ) injection 4 mg  4 mg Intravenous Q6H PRN Piscoya, Jose, MD       oxyCODONE  (ROXICODONE ) 5 MG/5ML solution 5 mg  5  mg Oral Q3H PRN Pabon, Diego F, MD       pantoprazole  (PROTONIX ) injection 40 mg  40 mg Intravenous QHS Piscoya, Jose, MD   40 mg at 06/08/24 2059   piperacillin-tazobactam (ZOSYN) IVPB 3.375 g  3.375 g Intravenous Q8H Piscoya, Jose, MD 12.5 mL/hr at 06/09/24 0554 Infusion Verify at 06/09/24 0554   sodium chloride  flush (NS) 0.9 % injection 10-40 mL  10-40 mL Intracatheter Q12H Piscoya, Jose, MD   10 mL at 06/09/24 1000   sodium chloride  flush (NS) 0.9 % injection 10-40 mL  10-40 mL Intracatheter PRN Piscoya, Jose, MD       tamsulosin  (FLOMAX ) capsule 0.4 mg  0.4 mg Oral Daily Piscoya, Jose, MD   0.4 mg at 06/08/24 1656     Discharge Medications: Please see discharge summary for a list of discharge medications.  Relevant Imaging Results:  Relevant Lab Results:   Additional Information    Victory Jackquline RAMAN, RN

## 2024-06-09 NOTE — Plan of Care (Signed)

## 2024-06-10 ENCOUNTER — Ambulatory Visit: Admitting: Surgery

## 2024-06-10 ENCOUNTER — Inpatient Hospital Stay

## 2024-06-10 ENCOUNTER — Encounter: Payer: Self-pay | Admitting: Surgery

## 2024-06-10 LAB — CBC
HCT: 19.4 % — ABNORMAL LOW (ref 39.0–52.0)
Hemoglobin: 6.1 g/dL — ABNORMAL LOW (ref 13.0–17.0)
MCH: 26.9 pg (ref 26.0–34.0)
MCHC: 31.4 g/dL (ref 30.0–36.0)
MCV: 85.5 fL (ref 80.0–100.0)
Platelets: 260 K/uL (ref 150–400)
RBC: 2.27 MIL/uL — ABNORMAL LOW (ref 4.22–5.81)
RDW: 14.1 % (ref 11.5–15.5)
WBC: 13.6 K/uL — ABNORMAL HIGH (ref 4.0–10.5)
nRBC: 0.1 % (ref 0.0–0.2)

## 2024-06-10 LAB — BASIC METABOLIC PANEL WITH GFR
Anion gap: 7 (ref 5–15)
BUN: 42 mg/dL — ABNORMAL HIGH (ref 8–23)
CO2: 19 mmol/L — ABNORMAL LOW (ref 22–32)
Calcium: 8 mg/dL — ABNORMAL LOW (ref 8.9–10.3)
Chloride: 114 mmol/L — ABNORMAL HIGH (ref 98–111)
Creatinine, Ser: 1.49 mg/dL — ABNORMAL HIGH (ref 0.61–1.24)
GFR, Estimated: 46 mL/min — ABNORMAL LOW (ref >60)
Glucose, Bld: 87 mg/dL (ref 70–99)
Potassium: 4.6 mmol/L (ref 3.5–5.1)
Sodium: 140 mmol/L (ref 135–145)

## 2024-06-10 LAB — MAGNESIUM: Magnesium: 2.6 mg/dL — ABNORMAL HIGH (ref 1.7–2.4)

## 2024-06-10 MED ORDER — SODIUM CHLORIDE 0.9% IV SOLUTION: Freq: Once | INTRAVENOUS | Status: DC

## 2024-06-10 MED ORDER — PIPERACILLIN-TAZOBACTAM 3.375 G IVPB 30 MIN: 3.3750 g | Freq: Once | INTRAVENOUS | Status: DC

## 2024-06-10 NOTE — Progress Notes (Signed)
 [facility] SURGICAL PROGRESS NOTE  Hospital Day(s): 5.   Post op day(s): 3 Days Post-Op.   Interval History:  Patient seen and examined No acute events or new complaints overnight.  Patient reports he is doing well considering Abdomen is sore expectedly Leukocytosis improving; WBC 13.6K Hgb to 6.1 Renal function improving; sCr - 1.49; UO - 1150 ccs NGT with 700 ccs out Drains as follows....  - RLQ (intra-abdominal): 95 ccs; serosanguinous  - LLQ and LUQ (Subcutaneous): 30 ccs; serosanguinous Prevena in place; output 50 ccs He did have BM in last 12 hours; no flatus   Vital signs in last 24 hours: [min-max] current  Temp:  [97.8 F (36.6 C)-98.6 F (37 C)] 98.2 F (36.8 C) (10/06 0353) Pulse Rate:  [75-86] 75 (10/06 0353) Resp:  [16-18] 18 (10/06 0353) BP: (131-139)/(68-74) 134/74 (10/06 0353) SpO2:  [97 %-98 %] 98 % (10/06 0353)     Height: 5' 9 (175.3 cm) Weight: 79.3 kg BMI (Calculated): 25.81   Intake/Output last 2 shifts:  10/05 0701 - 10/06 0700 In: 107.6 [IV Piggyback:107.6] Out: 2025 [Urine:1150; Emesis/NG output:700; Drains:175]   Physical Exam:  Constitutional: alert, cooperative and no distress  HEENT: NGT in place; output thinning Respiratory: breathing non-labored at rest  Cardiovascular: regular rate and sinus rhythm  Gastrointestinal: soft, expected soreness, and non-distended. Drain in RLQ (intra-abdominal) output is serosanguinous, Drains x2 in left abdomen (subcutaneous) output serosanguinous Genitourinary: Foley in place; good UO Integumentary: Prevena in place; good seal   Labs:     Latest Ref Rng & Units 06/10/2024    4:04 AM 06/09/2024    6:20 AM 06/08/2024    6:21 AM  CBC  WBC 4.0 - 10.5 K/uL 13.6  15.5  15.0   Hemoglobin 13.0 - 17.0 g/dL 6.1  7.0  8.8   Hematocrit 39.0 - 52.0 % 19.4  21.1  27.3   Platelets 150 - 400 K/uL 260  275  269       Latest Ref Rng & Units 06/10/2024    4:04 AM 06/09/2024    6:20 AM 06/08/2024     4:51 AM  CMP  Glucose 70 - 99 mg/dL 87  885    BUN 8 - 23 mg/dL 42  37    Creatinine 9.38 - 1.24 mg/dL 8.50  8.37  8.37   Sodium 135 - 145 mmol/L 140  138    Potassium 3.5 - 5.1 mmol/L 4.6  4.1    Chloride 98 - 111 mmol/L 114  109    CO2 22 - 32 mmol/L 19  20    Calcium  8.9 - 10.3 mg/dL 8.0  7.9      Imaging studies: No new pertinent imaging studies   Assessment/Plan:  83 y.o. male 3 Days Post-Op s/p exploratory laparotomy, extensive lysis of adhesions, excision of infected mesh, small bowel resection with anastomosis, open cholecystectomy, bladder repair, recurrent ventral hernia repair with bilateral myocutaneous flaps with anterior release.    - We will get 4 hour NGT clamping trial today; If residuals after 4 hours are <150 ccs, we can remove this and start diet  - Continue NPO for now pending NGT clamp trial  - IVF support   - Continue foley catheter; will need cystogram prior to removal given bladder repair intra-operatively   - Continue drains x3; monitor and record output   - Monitor H&H; suspect this is more dilutional and anemia of chronic disease, no overt evidence of bleeding, transfuse 2 units pRBC  today  - Monitor leukocytosis; improving - Monitor renal function; improving; good UO - Continue Prevena   - Mobilize; work with therapies - Discontinue Heparin given anemia    All of the above findings and recommendations were discussed with the patient, and the medical team, and all of patient's questions were answered to his expressed satisfaction.  -- Arthea Platt, PA-C  [facility] 06/10/2024, 7:20 AM M-F: 7am - 4pm

## 2024-06-10 NOTE — Progress Notes (Signed)
 PT Cancellation Note  Patient Details Name: Stephen Cervantes MRN: [id number] DOB: Nov 19, 1940   Cancelled Treatment:     Pt had just mobilized OOB with Mobility Specialist, currently back in bed. Will re-attempt next available date/time per POC.    [name] 06/10/2024, 3:21 PM

## 2024-06-10 NOTE — Progress Notes (Signed)
 Mobility Specialist - Progress Note   06/10/24 1400  Oxygen Therapy  SpO2 99 %  O2 Device Nasal Cannula  O2 Flow Rate (L/min) 2 L/min  Mobility  Activity Ambulated with assistance  Level of Assistance Contact guard assist, steadying assist  Assistive Device Front wheel walker  Distance Ambulated (ft) 8 ft  Range of Motion/Exercises Active  Activity Response Tolerated fair  Mobility visit 1 Mobility  Mobility Specialist Start Time (ACUTE ONLY) 1123  Mobility Specialist Stop Time (ACUTE ONLY) 1157  Mobility Specialist Time Calculation (min) (ACUTE ONLY) 34 min   Pt was supine in bed with guest upon entry. Pt agreed to mobility. Pt was able to EOB independently and understood were lines were for caution.  Pt was able to STS with CGA MinA and 2WW. Pt was able to ambulate forward backward and laterally before fatigue set in. Pt O2 levels remained above 92% throughout activity. Pt does require recovery break in intervals. After activity pt returned to EOB at first then supine. Pt had all needs in reach and nurse entered upon exit.  Clem Rodes Mobility Specialist 06/10/24, 2:31 PM

## 2024-06-10 NOTE — Anesthesia Postprocedure Evaluation (Signed)
 Anesthesia Post Note  Patient: Stephen Cervantes  Procedure(s) Performed: REPAIR, ABDOMINAL WALL REMOVAL, MESH, ABDOMEN OR PELVIS CHOLECYSTECTOMY EXCISION, SMALL INTESTINE  Patient location during evaluation: PACU Anesthesia Type: General Level of consciousness: awake and alert Pain management: pain level controlled Vital Signs Assessment: post-procedure vital signs reviewed and stable Respiratory status: spontaneous breathing, nonlabored ventilation, respiratory function stable and patient connected to nasal cannula oxygen Cardiovascular status: blood pressure returned to baseline and stable Postop Assessment: no apparent nausea or vomiting Anesthetic complications: no   There were no known notable events for this encounter.   Last Vitals:  Vitals:   06/10/24 0353 06/10/24 0700  BP: 134/74 (!) 152/72  Pulse: 75 72  Resp: 18 18  Temp: 36.8 C 36.4 C  SpO2: 98%     Last Pain:  Vitals:   06/10/24 0720  TempSrc:   PainSc: 0-No pain                 Camellia Merilee Louder

## 2024-06-10 NOTE — Progress Notes (Signed)
 Dr Jordis notified HGB is 6.1.

## 2024-06-10 NOTE — TOC Progression Note (Signed)
 Transition of Care [facility]) - Progression Note    Patient Details  Name: Stephen Cervantes MRN: [id number] Date of Birth: 08-14-1941  [facility]) CM/SW Contact  Alfonso Rummer, LCSW Phone Number: 06/10/2024, 11:58 AM  Clinical Narrative:    Pt agreed to transition to [facility] when medically ready    Expected Discharge Plan: Skilled Nursing Facility Barriers to Discharge: Continued Medical Work up               Expected Discharge Plan and Services     Post Acute Care Choice: Skilled Nursing Facility Living arrangements for the past 2 months: Single Family Home                                       Social Drivers of Health (SDOH) Interventions SDOH Screenings   Food Insecurity: No Food Insecurity (06/05/2024)  Housing: Low Risk  (06/05/2024)  Transportation Needs: No Transportation Needs (06/05/2024)  Utilities: Not At Risk (06/05/2024)  Financial Resource Strain: Low Risk  (05/15/2024)   Received from [facility]  Social Connections: Moderately Isolated (06/05/2024)  Tobacco Use: Medium Risk (06/07/2024)    Readmission Risk Interventions     No data to display

## 2024-06-11 LAB — TYPE AND SCREEN
ABO/RH(D): B POS
Antibody Screen: NEGATIVE
Unit division: 0
Unit division: 0

## 2024-06-11 LAB — BPAM RBC
Blood Product Expiration Date: 202511022359
Blood Product Expiration Date: 202511022359
ISSUE DATE / TIME: 202510061207
ISSUE DATE / TIME: 202510061515
Unit Type and Rh: 7300
Unit Type and Rh: 7300

## 2024-06-11 LAB — CBC
HCT: 24.2 % — ABNORMAL LOW (ref 39.0–52.0)
Hemoglobin: 8.4 g/dL — ABNORMAL LOW (ref 13.0–17.0)
MCH: 27.7 pg (ref 26.0–34.0)
MCHC: 34.7 g/dL (ref 30.0–36.0)
MCV: 79.9 fL — ABNORMAL LOW (ref 80.0–100.0)
Platelets: 248 K/uL (ref 150–400)
RBC: 3.03 MIL/uL — ABNORMAL LOW (ref 4.22–5.81)
RDW: 13.9 % (ref 11.5–15.5)
WBC: 14.6 K/uL — ABNORMAL HIGH (ref 4.0–10.5)
nRBC: 0.6 % — ABNORMAL HIGH (ref 0.0–0.2)

## 2024-06-11 LAB — ANAEROBIC AND AEROBIC CULTURE

## 2024-06-11 LAB — AEROBIC/ANAEROBIC CULTURE W GRAM STAIN (SURGICAL/DEEP WOUND)

## 2024-06-11 LAB — BASIC METABOLIC PANEL WITH GFR
Anion gap: 7 (ref 5–15)
BUN: 34 mg/dL — ABNORMAL HIGH (ref 8–23)
CO2: 21 mmol/L — ABNORMAL LOW (ref 22–32)
Calcium: 8 mg/dL — ABNORMAL LOW (ref 8.9–10.3)
Chloride: 111 mmol/L (ref 98–111)
Creatinine, Ser: 1.37 mg/dL — ABNORMAL HIGH (ref 0.61–1.24)
GFR, Estimated: 51 mL/min — ABNORMAL LOW (ref >60)
Glucose, Bld: 111 mg/dL — ABNORMAL HIGH (ref 70–99)
Potassium: 3.7 mmol/L (ref 3.5–5.1)
Sodium: 139 mmol/L (ref 135–145)

## 2024-06-11 LAB — MAGNESIUM: Magnesium: 2.4 mg/dL (ref 1.7–2.4)

## 2024-06-11 LAB — SURGICAL PATHOLOGY

## 2024-06-11 LAB — PREPARE RBC (CROSSMATCH)

## 2024-06-11 MED ORDER — LOSARTAN POTASSIUM 50 MG PO TABS
50.0000 mg | ORAL_TABLET | Freq: Every day | ORAL | Status: DC
  Administered 2024-06-11 – 2024-06-14 (×4): 50 mg via ORAL
  Filled 2024-06-11 (×4): qty 1

## 2024-06-11 MED ORDER — OXYCODONE HCL 5 MG PO TABS
5.0000 mg | ORAL_TABLET | ORAL | Status: DC | PRN
  Administered 2024-06-11 – 2024-06-14 (×4): 10 mg via ORAL
  Filled 2024-06-11 (×5): qty 2

## 2024-06-11 MED ORDER — CHLORHEXIDINE GLUCONATE CLOTH 2 % EX PADS
6.0000 | MEDICATED_PAD | Freq: Every day | CUTANEOUS | Status: DC
  Administered 2024-06-11 – 2024-06-13 (×3): 6 via TOPICAL

## 2024-06-11 MED ORDER — ACETAMINOPHEN 325 MG PO TABS
650.0000 mg | ORAL_TABLET | Freq: Four times a day (QID) | ORAL | Status: DC | PRN
  Administered 2024-06-12 – 2024-06-13 (×4): 650 mg via ORAL
  Filled 2024-06-11 (×4): qty 2

## 2024-06-11 NOTE — Progress Notes (Signed)
 Mobility Specialist - Progress Note  Pre-mobility: HR-144/92, BP-79, SpO2-99% During mobility: HR-150/83, SpO2-96%      06/11/24 1200  Therapy Vitals  BP (!) 144/92  Oxygen Therapy  SpO2 100 %  O2 Device Room Air  Mobility  Activity Ambulated with assistance  Level of Assistance Contact guard assist, steadying assist  Assistive Device Front wheel walker  Distance Ambulated (ft) 15 ft  Range of Motion/Exercises Active Assistive  Activity Response Tolerated well  Mobility visit 1 Mobility  Mobility Specialist Start Time (ACUTE ONLY) 1132  Mobility Specialist Stop Time (ACUTE ONLY) 1215  Mobility Specialist Time Calculation (min) (ACUTE ONLY) 43 min   Pt was supine in bed upon entry. Pt agreed to mobility. BP was taken in supine position and in upright standing position. Pt was able to EOB independently with mild assist with edge guard of bed. Pt was able to STS with CGA and MinA with 2WW for support. Pt had a BM and was able to make it to [facility]. Pt was able to ambulate after BM. Pt stated still pain around the leads from his mid section but other wise ok. After activity pt was able to return to bed with needs in reach. Nurse was in the room upon exit.   Clem Rodes Mobility Specialist 06/11/24, 12:44 PM

## 2024-06-11 NOTE — Plan of Care (Signed)

## 2024-06-11 NOTE — Progress Notes (Signed)
 [facility] SURGICAL PROGRESS NOTE  Hospital Day(s): 6.   Post op day(s): 4 Days Post-Op.   Interval History:  Patient seen and examined No acute events or new complaints overnight.  Patient reports he is doing well considering Abdomen is sore expectedly Leukocytosis; WBC 14.6K Hgb to 8.4; responded to transfusion appropriately  Renal function improving; sCr - 1.37; UO - 2050 ccs Drains as follows....  - RLQ (intra-abdominal): 120 ccs; serosanguinous  - LLQ and LUQ (Subcutaneous): nothing recroded; serosanguinous Prevena in place Bowel function has resumed; diarrhea at baseline  Now on CLD: no issues  Vital signs in last 24 hours: [min-max] current  Temp:  [97.9 F (36.6 C)-99 F (37.2 C)] 98 F (36.7 C) (10/06 1947) Pulse Rate:  [75-91] 75 (10/06 2332) Resp:  [18-19] 18 (10/06 1947) BP: (128-164)/(62-88) 141/82 (10/06 2332) SpO2:  [97 %-100 %] 97 % (10/06 1947)     Height: 5' 9 (175.3 cm) Weight: 79.3 kg BMI (Calculated): 25.81   Intake/Output last 2 shifts:  10/06 0701 - 10/07 0700 In: 996.7 [P.O.:200; Blood:796.7] Out: 2170 [Urine:2050; Drains:120]   Physical Exam:  Constitutional: alert, cooperative and no distress  Respiratory: breathing non-labored at rest  Cardiovascular: regular rate and sinus rhythm  Gastrointestinal: soft, expected soreness, and non-distended. Drain in RLQ (intra-abdominal) output is serosanguinous, Drains x2 in left abdomen (subcutaneous) output serosanguinous Genitourinary: Foley in place; good UO Integumentary: Prevena in place; good seal   Labs:     Latest Ref Rng & Units 06/11/2024    6:22 AM 06/10/2024    4:04 AM 06/09/2024    6:20 AM  CBC  WBC 4.0 - 10.5 K/uL 14.6  13.6  15.5   Hemoglobin 13.0 - 17.0 g/dL 8.4  6.1  7.0   Hematocrit 39.0 - 52.0 % 24.2  19.4  21.1   Platelets 150 - 400 K/uL 248  260  275       Latest Ref Rng & Units 06/11/2024    6:22 AM 06/10/2024    4:04 AM 06/09/2024    6:20 AM  CMP  Glucose  70 - 99 mg/dL 888  87  885   BUN 8 - 23 mg/dL 34  42  37   Creatinine 0.61 - 1.24 mg/dL 8.62  8.50  8.37   Sodium 135 - 145 mmol/L 139  140  138   Potassium 3.5 - 5.1 mmol/L 3.7  4.6  4.1   Chloride 98 - 111 mmol/L 111  114  109   CO2 22 - 32 mmol/L 21  19  20    Calcium  8.9 - 10.3 mg/dL 8.0  8.0  7.9     Imaging studies: No new pertinent imaging studies   Assessment/Plan:  83 y.o. male 4 Days Post-Op s/p exploratory laparotomy, extensive lysis of adhesions, excision of infected mesh, small bowel resection with anastomosis, open cholecystectomy, bladder repair, recurrent ventral hernia repair with bilateral myocutaneous flaps with anterior release.    - Okay for FLD; Consider soft diet for dinner today   - Continue foley catheter; will need cystogram prior to removal given bladder repair intra-operatively   - Continue drains x3; monitor and record output   - We will continue to monitor bowel function; anticipate restarting baseline imodium  in next 24 hours   - Monitor H&H; responded to transfusion 10/06 - morning CBC  - Monitor leukocytosis; fluctuating  - Monitor renal function; improving; good UO - Continue Prevena   - Mobilize; work with therapies   -  Discharge Planning: He is progressing well given magnitude of surgery. Anticipate ready for DC at the end of the week if progress continues.   All of the above findings and recommendations were discussed with the patient, and the medical team, and all of patient's questions were answered to his expressed satisfaction.  -- Arthea Platt, PA-C [facility] 06/11/2024, 7:18 AM M-F: 7am - 4pm

## 2024-06-12 ENCOUNTER — Other Ambulatory Visit: Payer: Self-pay | Admitting: Urology

## 2024-06-12 DIAGNOSIS — S3720XD Unspecified injury of bladder, subsequent encounter: Secondary | ICD-10-CM

## 2024-06-12 LAB — BASIC METABOLIC PANEL WITH GFR
Anion gap: 5 (ref 5–15)
BUN: 25 mg/dL — ABNORMAL HIGH (ref 8–23)
CO2: 22 mmol/L (ref 22–32)
Calcium: 8.1 mg/dL — ABNORMAL LOW (ref 8.9–10.3)
Chloride: 111 mmol/L (ref 98–111)
Creatinine, Ser: 1.34 mg/dL — ABNORMAL HIGH (ref 0.61–1.24)
GFR, Estimated: 53 mL/min — ABNORMAL LOW (ref >60)
Glucose, Bld: 114 mg/dL — ABNORMAL HIGH (ref 70–99)
Potassium: 3.6 mmol/L (ref 3.5–5.1)
Sodium: 138 mmol/L (ref 135–145)

## 2024-06-12 LAB — CBC
HCT: 23.6 % — ABNORMAL LOW (ref 39.0–52.0)
Hemoglobin: 7.9 g/dL — ABNORMAL LOW (ref 13.0–17.0)
MCH: 27.3 pg (ref 26.0–34.0)
MCHC: 33.5 g/dL (ref 30.0–36.0)
MCV: 81.7 fL (ref 80.0–100.0)
Platelets: 212 K/uL (ref 150–400)
RBC: 2.89 MIL/uL — ABNORMAL LOW (ref 4.22–5.81)
RDW: 14.1 % (ref 11.5–15.5)
WBC: 12.4 K/uL — ABNORMAL HIGH (ref 4.0–10.5)
nRBC: 0.6 % — ABNORMAL HIGH (ref 0.0–0.2)

## 2024-06-12 LAB — AEROBIC/ANAEROBIC CULTURE W GRAM STAIN (SURGICAL/DEEP WOUND): Gram Stain: NONE SEEN

## 2024-06-12 MED ORDER — LOPERAMIDE HCL 2 MG PO CAPS
2.0000 mg | ORAL_CAPSULE | Freq: Three times a day (TID) | ORAL | Status: DC
  Administered 2024-06-12 – 2024-06-14 (×7): 2 mg via ORAL
  Filled 2024-06-12 (×8): qty 1

## 2024-06-12 NOTE — Progress Notes (Signed)
 Mobility Specialist - Progress Note Pre-mobility: HR-75, BP-151/84, SpO2-98  During mobility: HR-79, BP-157/96, SpO2-95  Post-mobility: HR-80, BP-140/89, SPO2-97    06/12/24 1200  Mobility  Activity Ambulated with assistance;Stood at bedside;Pivoted/transferred to/from BSC;Respositioned in chair  Level of Assistance Contact guard assist, steadying assist  Assistive Device Front wheel walker  Distance Ambulated (ft) 12 ft  Range of Motion/Exercises Active  Activity Response Tolerated fair  Mobility visit 1 Mobility  Mobility Specialist Start Time (ACUTE ONLY) 1158  Mobility Specialist Stop Time (ACUTE ONLY) 1231  Mobility Specialist Time Calculation (min) (ACUTE ONLY) 33 min   Pt was supine in bed upon entry. Pt agreed to mobility. Vital were taken as a precautionary measure. Pt stated that he felt a bit of soreness today, but was ready to get into chair and ambulate a bit. Pt was able to EOB minA. Pt was able to pivot to [facility] with 2WW and CGA. Pt was able to STS with CGA. Pt was able to ambulate to the recliner with minA CGA for care. Pt has all needs within reach.  Clem Rodes Mobility Specialist 06/12/24, 12:44 PM

## 2024-06-12 NOTE — Progress Notes (Signed)
 [facility] SUR[facility]ICAL PROGRESS NOTE  Hospital Day(s): 7.   Post op day(s): 5 Days Post-Op.   Interval History:  Patient seen and examined No acute events or new complaints overnight.  Patient reports he is doing well No fever, chills, nausea, emesis Leukocytosis; WBC 12.4K - improved Hgb to 7.9; transfused 10/06 - 2 units Renal function stable/baseline; sCr - 1.34; UO - 950 ccs + unmeasured Drains as follows....  - RLQ (intra-abdominal): 100 ccs; serosanguinous  - LLQ (Subcutaneous): 0 ccs - LUQ (Subcutaneous): 35 ccs; serosanguinous Prevena in place; 55 ccs recroded Bowel function has resumed; diarrhea at baseline  Soft diet; no issues   Vital signs in last 24 hours: [min-max] current  Temp:  [98.1 F (36.7 C)-98.5 F (36.9 C)] 98.2 F (36.8 C) (10/08 0808) Pulse Rate:  [73-85] 78 (10/08 0808) Resp:  [14-18] 18 (10/08 0808) BP: (142-158)/(77-92) 150/79 (10/08 0808) SpO2:  [95 %-100 %] 96 % (10/08 0808)     Height: 5' 9 (175.3 cm) Weight: 79.3 kg BMI (Calculated): 25.81   Intake/Output last 2 shifts:  10/07 0701 - 10/08 0700 In: 740 [P.O.:740] Out: 1140 [Urine:950; Drains:190]   Physical Exam:  Constitutional: alert, cooperative and no distress  Respiratory: breathing non-labored at rest  Cardiovascular: regular rate and sinus rhythm  Gastrointestinal: soft, expected soreness, and non-distended. Drain in RLQ (intra-abdominal) output is serosanguinous, Drains x2 in left abdomen (subcutaneous) output serosanguinous Genitourinary: Foley in place; good UO Integumentary: Prevena in place; good seal   Labs:     Latest Ref Rng & Units 06/12/2024    5:27 AM 06/11/2024    6:22 AM 06/10/2024    4:04 AM  CBC  WBC 4.0 - 10.5 K/uL 12.4  14.6  13.6   Hemoglobin 13.0 - 17.0 g/dL 7.9  8.4  6.1   Hematocrit 39.0 - 52.0 % 23.6  24.2  19.4   Platelets 150 - 400 K/uL 212  248  260       Latest Ref Rng & Units 06/12/2024    5:27 AM 06/11/2024    6:22 AM 06/10/2024     4:04 AM  CMP  Glucose 70 - 99 mg/dL 885  888  87   BUN 8 - 23 mg/dL 25  34  42   Creatinine 0.61 - 1.24 mg/dL 8.65  8.62  8.50   Sodium 135 - 145 mmol/L 138  139  140   Potassium 3.5 - 5.1 mmol/L 3.6  3.7  4.6   Chloride 98 - 111 mmol/L 111  111  114   CO2 22 - 32 mmol/L 22  21  19    Calcium  8.9 - 10.3 mg/dL 8.1  8.0  8.0     Imaging studies: No new pertinent imaging studies   Assessment/Plan:  83 y.o. male 5 Days Post-Op s/p exploratory laparotomy, extensive lysis of adhesions, excision of infected mesh, small bowel resection with anastomosis, open cholecystectomy, bladder repair, recurrent ventral hernia repair with bilateral myocutaneous flaps with anterior release.    - Okay to continue soft diet as tolerated   - Continue foley catheter; will need cystogram prior to removal given bladder repair intra-operatively   - Continue drains x3; monitor and record output   - We will continue to monitor bowel function; restart imodium  2 mg TID today   - Monitor H&H; responded to transfusion 10/06 - morning CBC  - Monitor leukocytosis; fluctuating  - Monitor renal function; back to baseline; good UO - Continue Prevena for now; Day  5/7  - Mobilize; work with therapies   - Discharge Planning: Doing well. We will anticipate discharge Friday 10/10 as long as he continues to progress  All of the above findings and recommendations were discussed with the patient, and the medical team, and all of patient's questions were answered to his expressed satisfaction.  -- Arthea Platt, PA-C [facility] 06/12/2024, 8:12 AM M-F: 7am - 4pm

## 2024-06-12 NOTE — Progress Notes (Signed)
 PT Cancellation Note  Patient Details Name: ALEZANDER DIMAANO MRN: [id number] DOB: 01-11-1941   Cancelled Treatment:     Pt declined PT session after just working with Mobility Specialist. Will re-attempt next available date/time per POC.    [name] 06/12/2024, 2:55 PM

## 2024-06-12 NOTE — Progress Notes (Signed)
 Mobility Specialist Progress Note:    06/12/24 1343  Mobility  Activity Ambulated with assistance;Pivoted/transferred to/from [facility]  Level of Assistance Contact guard assist, steadying assist  Assistive Device Front wheel walker  Distance Ambulated (ft) 12 ft  Range of Motion/Exercises Active;All extremities  Activity Response Tolerated well  Mobility visit 1 Mobility  Mobility Specialist Start Time (ACUTE ONLY) 1330  Mobility Specialist Stop Time (ACUTE ONLY) 1343  Mobility Specialist Time Calculation (min) (ACUTE ONLY) 13 min   Assisted pt with CGA to ambulate to [facility]. Tolerated well, fatigued easily. Left on [facility] with call bell and nursing notified. All needs met.  Sherrilee Ditty Mobility Specialist Please contact via Special educational needs teacher or  Rehab office at [phone]

## 2024-06-12 NOTE — Plan of Care (Signed)

## 2024-06-12 NOTE — TOC Progression Note (Signed)
 Transition of Care [facility]) - Progression Note    Patient Details  Name: Stephen Cervantes MRN: [id number] Date of Birth: August 01, 1941  Transition of Care [facility]) CM/SW Contact  Corean ONEIDA Haddock, RN Phone Number: 06/12/2024, 9:39 AM  Clinical Narrative:     Per surgery anticipated medical readiness for dc on Friday.  Massie with [facility] updated  Expected Discharge Plan: Skilled Nursing Facility Barriers to Discharge: Continued Medical Work up               Expected Discharge Plan and Services     Post Acute Care Choice: Skilled Nursing Facility Living arrangements for the past 2 months: Single Family Home                                       Social Drivers of Health (SDOH) Interventions SDOH Screenings   Food Insecurity: No Food Insecurity (06/05/2024)  Housing: Low Risk  (06/05/2024)  Transportation Needs: No Transportation Needs (06/05/2024)  Utilities: Not At Risk (06/05/2024)  Financial Resource Strain: Low Risk  (05/15/2024)   Received from [facility]  Social Connections: Moderately Isolated (06/05/2024)  Tobacco Use: Medium Risk (06/07/2024)    Readmission Risk Interventions     No data to display

## 2024-06-13 LAB — CBC
HCT: 23.1 % — ABNORMAL LOW (ref 39.0–52.0)
Hemoglobin: 7.7 g/dL — ABNORMAL LOW (ref 13.0–17.0)
MCH: 27.6 pg (ref 26.0–34.0)
MCHC: 33.3 g/dL (ref 30.0–36.0)
MCV: 82.8 fL (ref 80.0–100.0)
Platelets: 214 K/uL (ref 150–400)
RBC: 2.79 MIL/uL — ABNORMAL LOW (ref 4.22–5.81)
RDW: 14.1 % (ref 11.5–15.5)
WBC: 12.1 K/uL — ABNORMAL HIGH (ref 4.0–10.5)
nRBC: 0.2 % (ref 0.0–0.2)

## 2024-06-13 MED ORDER — PANTOPRAZOLE SODIUM 40 MG PO TBEC
40.0000 mg | DELAYED_RELEASE_TABLET | Freq: Every day | ORAL | Status: DC
  Administered 2024-06-13: 40 mg via ORAL
  Filled 2024-06-13: qty 1

## 2024-06-13 NOTE — Progress Notes (Signed)
 PT Cancellation Note  Patient Details Name: Stephen Cervantes MRN: [id number] DOB: 10/02/1940   Cancelled Treatment:     Pt declined PT session after working with mobility specialist earlier today. Pt states he will d/c home tomorrow, no DME needs.   [name] 06/13/2024, 4:49 PM

## 2024-06-13 NOTE — Progress Notes (Signed)
 Patient and his wife have been educated and have demonstrated how to empty JP drains and foley catheter bag

## 2024-06-13 NOTE — Plan of Care (Signed)

## 2024-06-13 NOTE — Progress Notes (Signed)
  [facility]  Hospital Day(s): 8.   Post op day(s): 6 Days Post-Op.   Interval History:  Patient seen and examined No acute events or new complaints overnight.  Patient reports he is doing well; soreness persists expectedly No fever, chills, nausea, emesis Leukocytosis; WBC 12.1K - improved Hgb to 7.7; transfused 10/06 - 2 units Drains as follows....  - RLQ (intra-abdominal): 155 ccs; serosanguinous  - LLQ (Subcutaneous): 5 ccs - LUQ (Subcutaneous): 15 ccs; serosanguinous Prevena in place; 10 ccs recroded Bowel function has resumed; diarrhea; feels like his is at baseline - on imodium  Soft diet; no issues   Vital signs in last 24 hours: [min-max] current  Temp:  [98.2 F (36.8 C)-98.5 F (36.9 C)] 98.5 F (36.9 C) (10/09 0355) Pulse Rate:  [73-78] 73 (10/09 0355) Resp:  [18-19] 19 (10/09 0355) BP: (147-154)/(76-86) 147/86 (10/09 0355) SpO2:  [95 %-96 %] 95 % (10/09 0355)     Height: 5' 9 (175.3 cm) Weight: 79.3 kg BMI (Calculated): 25.81   Intake/Output last 2 shifts:  10/08 0701 - 10/09 0700 In: 687.3 [P.O.:200; IV Piggyback:487.3] Out: 1885 [Urine:1700; Drains:185]   Physical Exam:  Constitutional: alert, cooperative and no distress  Respiratory: breathing non-labored at rest  Cardiovascular: regular rate and sinus rhythm  Gastrointestinal: soft, expected soreness, and non-distended. Drain in RLQ (intra-abdominal) output is serosanguinous, Drains x2 in left abdomen (subcutaneous) output serosanguinous Genitourinary: Foley in place; good UO Integumentary: Prevena in place; good seal   Labs:     Latest Ref Rng & Units 06/13/2024    3:40 AM 06/12/2024    5:27 AM 06/11/2024    6:22 AM  CBC  WBC 4.0 - 10.5 K/uL 12.1  12.4  14.6   Hemoglobin 13.0 - 17.0 g/dL 7.7  7.9  8.4   Hematocrit 39.0 - 52.0 % 23.1  23.6  24.2   Platelets 150 - 400 K/uL 214  212  248       Latest Ref Rng & Units 06/12/2024    5:27 AM 06/11/2024    6:22 AM  06/10/2024    4:04 AM  CMP  Glucose 70 - 99 mg/dL 885  888  87   BUN 8 - 23 mg/dL 25  34  42   Creatinine 0.61 - 1.24 mg/dL 8.65  8.62  8.50   Sodium 135 - 145 mmol/L 138  139  140   Potassium 3.5 - 5.1 mmol/L 3.6  3.7  4.6   Chloride 98 - 111 mmol/L 111  111  114   CO2 22 - 32 mmol/L 22  21  19    Calcium  8.9 - 10.3 mg/dL 8.1  8.0  8.0     Imaging studies: No new pertinent imaging studies   Assessment/Plan:  83 y.o. male 6 Days Post-Op s/p exploratory laparotomy, extensive lysis of adhesions, excision of infected mesh, small bowel resection with anastomosis, open cholecystectomy, bladder repair, recurrent ventral hernia repair with bilateral myocutaneous flaps with anterior release.    - Okay to continue soft diet as tolerated   - Continue foley catheter; will need cystogram prior to removal given bladder repair intra-operatively   - Continue drains x3; monitor and record output   - We will continue to monitor bowel function; Continue imodium  2 mg TID today, may need to titrate up  - Monitor H&H; relatively stable; responded to transfusion 10/06  - Monitor leukocytosis; improving - Monitor renal function; back to baseline; good UO - Continue Prevena for now;  Day 6/7  - Mobilize; work with therapies; recommendations are SNF   - Discharge Planning: Overall doing well considering the magnitude of surgery. Tentatively planned for DC to SNF tomorrow (10/10). He will go with foley catheter, drains x3, and Prevena. Anticipate 1 week follow up for drain removal.   All of the above findings and recommendations were discussed with the patient, and the medical team, and all of patient's questions were answered to his expressed satisfaction.  -- Arthea Platt, PA-C [facility] 06/13/2024, 7:13 AM M-F: 7am - 4pm

## 2024-06-13 NOTE — Progress Notes (Signed)
 Mobility Specialist - Progress Note   06/13/24 1555  Therapy Vitals  BP (!) 148/81  Patient Position (if appropriate) Lying  Oxygen Therapy  SpO2 97 %  O2 Device Room Air  Mobility  Activity Ambulated with assistance;Stood at bedside  Level of Assistance Contact guard assist, steadying assist  Assistive Device Front wheel walker  Distance Ambulated (ft) 60 ft  Range of Motion/Exercises Active  Activity Response Tolerated fair  Mobility Referral Yes  Mobility visit 1 Mobility  Mobility Specialist Start Time (ACUTE ONLY) 1347  Mobility Specialist Stop Time (ACUTE ONLY) 1415  Mobility Specialist Time Calculation (min) (ACUTE ONLY) 28 min   Pt was supine in the bed with guest within the room. Pt agreed to mobility. Pt is able to EOB with minA CGA and bed features. Pt is able to STS with minA CGA and 2 [employer]. Pt is able to ambulate well with steady assist and 2 [employer]. Pt was able to ambulate to the bathroom and have a BM. Afterwards pt was able to ambulate forwards, backwards and laterally. Pt was able to understand cues and pivot accordingly. Pt was also able to march in place. After activity Pt returned to bed in supine position and needs within reach.  Clem Rodes Mobility Specialist 06/13/24, 4:18 PM

## 2024-06-13 NOTE — Progress Notes (Signed)
 Mobility Specialist - Progress Note  Pre-mobility: HR-78, BP-149/82, SpO2-99% During mobility: HR-86, BP-149/85, SpO2-97%   06/13/24 1100  Oxygen Therapy  SpO2 98 %  O2 Device Room Air  Mobility  Activity Ambulated with assistance;Stood at bedside  Level of Assistance Contact guard assist, steadying assist  Assistive Device Front wheel walker  Distance Ambulated (ft) 50 ft  Range of Motion/Exercises Active  Activity Response Tolerated well  Mobility Referral Yes  Mobility visit 1 Mobility  Mobility Specialist Start Time (ACUTE ONLY) 1024  Mobility Specialist Stop Time (ACUTE ONLY) 1058  Mobility Specialist Time Calculation (min) (ACUTE ONLY) 34 min   Pt was supine in bed with HOB elevated. Pt agreed to mobility. Pt vitals were taken before and during mobility for caution. Pt was able to get to EOB with bed features. Pt was able to STS with CGA minA and 2 [employer]. Pt ambulated with strong gait pattern. Pt was able to execute cues for direction well. Pt was able to pivot on both sides and back pedal. Pt did need recovery breaks due to distance and fatigue. Pt did state soreness from incisions location. After activity pt returned to bed with HOB elevated. Needs are in reach.  Clem Rodes Mobility Specialist 06/13/24, 11:27 AM

## 2024-06-14 ENCOUNTER — Other Ambulatory Visit: Payer: Self-pay

## 2024-06-14 LAB — CBC
HCT: 25.9 % — ABNORMAL LOW (ref 39.0–52.0)
Hemoglobin: 8.5 g/dL — ABNORMAL LOW (ref 13.0–17.0)
MCH: 27.7 pg (ref 26.0–34.0)
MCHC: 32.8 g/dL (ref 30.0–36.0)
MCV: 84.4 fL (ref 80.0–100.0)
Platelets: 214 K/uL (ref 150–400)
RBC: 3.07 MIL/uL — ABNORMAL LOW (ref 4.22–5.81)
RDW: 16.2 % — ABNORMAL HIGH (ref 11.5–15.5)
WBC: 13.8 K/uL — ABNORMAL HIGH (ref 4.0–10.5)
nRBC: 0 % (ref 0.0–0.2)

## 2024-06-14 MED ORDER — OXYCODONE HCL 5 MG PO TABS
5.0000 mg | ORAL_TABLET | Freq: Four times a day (QID) | ORAL | 0 refills | Status: AC | PRN
  Filled 2024-06-14: qty 30, 4d supply, fill #0

## 2024-06-14 MED ORDER — LOPERAMIDE HCL 2 MG PO CAPS: 4.0000 mg | ORAL_CAPSULE | Freq: Three times a day (TID) | ORAL | Status: DC

## 2024-06-14 MED ORDER — CIPROFLOXACIN HCL 500 MG PO TABS
500.0000 mg | ORAL_TABLET | Freq: Two times a day (BID) | ORAL | 0 refills | Status: AC
  Filled 2024-06-14: qty 8, 4d supply, fill #0

## 2024-06-14 NOTE — TOC Transition Note (Signed)
 Transition of Care [facility]) - Discharge Note   Patient Details  Name: Stephen Cervantes MRN: [id number] Date of Birth: Aug 02, 1941  Transition of Care [facility]) CM/SW Contact:  Corean ONEIDA Haddock, RN Phone Number: 06/14/2024, 10:52 AM   Clinical Narrative:     Therapy saw patient today and update recs to [facility] no DME needs Met with patient and significant other Stephen Cervantes at bedside.  They are in agreement for discharge home with [facility] today.  They state they do not have a preference of agency.  Referral made and accepted by Shaun with [facility]  [facility] to transport today Patient and [facility] state they feel comfortable managing drains.  Patient to discharge with prevena and foley   Final next level of care: Home w Home Health Services Barriers to Discharge: Continued Medical Work up   Patient Goals and CMS Choice   CMS Medicare.gov Compare Post Acute Care list provided to:: Patient Choice offered to / list presented to : Patient, Spouse      Discharge Placement                       Discharge Plan and Services Additional resources added to the After Visit Summary for       Post Acute Care Choice: Skilled Nursing Facility                    HH Arranged: RN, PT [facility] Agency: [facility] Date [facility] Agency Contacted: 06/14/24   Representative spoke with at [facility] Agency: Shaun  Social Drivers of Health (SDOH) Interventions SDOH Screenings   Food Insecurity: No Food Insecurity (06/05/2024)  Housing: Low Risk  (06/05/2024)  Transportation Needs: No Transportation Needs (06/05/2024)  Utilities: Not At Risk (06/05/2024)  Financial Resource Strain: Low Risk  (05/15/2024)   Received from [facility]  Social Connections: Moderately Isolated (06/05/2024)  Tobacco Use: Medium Risk (06/07/2024)     Readmission Risk Interventions     No data to display

## 2024-06-14 NOTE — Discharge Instructions (Signed)
 In addition to included general post-operative instructions,  Diet: Resume home diet.   Activity: No heavy lifting >20 pounds (children, pets, laundry, garbage) or strenuous activity for 6 weeks, but light activity and walking are encouraged. Do not drive or drink alcohol if taking narcotic pain medications or having pain that might distract from driving.  Drain(s): Monitor and record output of these daily. Handout given to monitor this. We will remove these in follow up.   Wound care: You may take a very short shower/get incision wet with soapy water and pat dry (do not rub incisions), but no baths or submerging incision underwater until follow-up.   Medications: Resume all home medications. For mild to moderate pain: acetaminophen  (Tylenol ) or ibuprofen/naproxen (if no kidney disease). Combining Tylenol  with alcohol can substantially increase your risk of causing liver disease. Narcotic pain medications, if prescribed, can be used for severe pain, though may cause nausea, constipation, and drowsiness. Do not combine Tylenol  and Percocet (or similar) within a 6 hour period as Percocet (and similar) contain(s) Tylenol . If you do not need the narcotic pain medication, you do not need to fill the prescription.  Call office [phone] / [phone]) at any time if any questions, worsening pain, fevers/chills, bleeding, drainage from incision site, or other concerns.

## 2024-06-14 NOTE — Plan of Care (Signed)

## 2024-06-14 NOTE — Progress Notes (Addendum)
 Pt discharged home with wife in stable condition. DC instructions given. Pt and spouse verbalized understanding. Wife able to return demonstrate JP drain emptying. No questions or concerns at this time. Pt switched to Foley leg bag. Wife verbalized and was able to demonstrate emptying of that  bag as well. Abdominal binder placed. Meds delivered to patient.

## 2024-06-14 NOTE — Discharge Summary (Addendum)
 [facility] SURGICAL DISCHARGE SUMMARY  Patient ID: Stephen Cervantes MRN: [id number] DOB/AGE: 05/06/1941 83 y.o.  Admit date: 06/05/2024 Discharge date: 06/14/2024  Discharge Diagnoses Patient Active Problem List   Diagnosis Date Noted   Calculus of gallbladder without cholecystitis without obstruction 06/07/2024   Infected prosthetic mesh of abdominal wall 06/05/2024    Consultants None  Procedures 06/07/2024 Exploratory Laparotomy, extensive lysis of adhesions, excision of infected mesh, debridement of midline skin wound/abscess cavity for size 15 x 3 cm, small bowel resection with anastomosis, open cholecystectomy, bladder repair, recurrent ventral hernia repair with bilateral myocutaneous flaps with anterior release.   HPI: Stephen Cervantes is a 83 y.o. male presenting to clinic for follow up of upper abdominal pain.  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  He had a CT scan on 03/24/24 showing inflammatory changes beneath the mesh in the LUQ and RUQ.  This improved with antibiotic management as noted on repeat CT scan on 04/15/24.  He has a history of PUD and had prior admission due to bleeding duodenal ulcer, and given this, he was referred to GI for EGD to further evaluate for other possible etiology to his abdominal pain.  EGD was done by Dr. Unk on 05/30/24 which showed diffuse moderately erythematous mucosa without bleeding in the greater curvature and antrum.  Biopsies were negative for H pylori or malignancy/atypia.     Today he reports that he's been feeling tired over the past 5 days, and has noted an area of swelling developing in the lower abdomen over those days as well.  Denies any fevers or chills, but reports worsening swelling and pain.  In the office, he was noted to have an abdominal wall abscess and had I&D in the  office, revealing foul smelling purulent fluid.  CT scan was obtained stat today to further evaluate, and this showed an intra-abdominal fluid collection at the level of his hernia mesh, tracking out to his I&D site.  There is also concern about possible oral contrast extravasation into this abscess cavity.  Hospital Course: Informed consent was obtained and documented, and patient underwent exploratory Laparotomy, extensive lysis of adhesions, excision of infected mesh, debridement of midline skin wound/abscess cavity for size 15 x 3 cm, small bowel resection with anastomosis, open cholecystectomy, bladder repair, recurrent ventral hernia repair with bilateral myocutaneous flaps with anterior release. (Dr Desiderio, 06/07/2024).  Post-operatively, patient did well given the circumstances.He did have NGT post-operatively but this was removed on POD3. Diet was advanced without issue. He did require transfusion of 2 units pRBCs on 10/06. He ultimately progressed well with therapies. Advancement of patient's diet and ambulation were well-tolerated. The remainder of patient's hospital course was essentially unremarkable, and discharge planning was initiated accordingly with patient safely able to be discharged home with appropriate discharge instructions, antibiotics (Cipro  x4 days), pain control, and outpatient follow-up after all of his and his family's questions were answered to their expressed satisfaction.   Discharge Condition: Good   Physical Examination:  Constitutional: alert, cooperative and no distress  Respiratory: breathing non-labored at rest  Cardiovascular: regular rate and sinus rhythm  Gastrointestinal: soft, expected soreness, and non-distended. Drain in RLQ (intra-abdominal) output is serosanguinous, Drains x2 in left abdomen (subcutaneous) output serosanguinous Genitourinary: Foley in place; good UO Integumentary: Prevena in place; good seal    Allergies as of 06/14/2024  Reactions   Morphine And Codeine Other (See Comments)   Severe hallucinations   Tape Other (See Comments)   Silk Tape per patient peels off my skin        Medication List     STOP taking these medications    amoxicillin -clavulanate 875-125 MG tablet Commonly known as: AUGMENTIN        TAKE these medications    atorvastatin  10 MG tablet Commonly known as: LIPITOR Take 10 mg by mouth daily.   celecoxib  200 MG capsule Commonly known as: CELEBREX  Take 1 capsule (200 mg total) by mouth daily.   cholestyramine 4 g packet Commonly known as: QUESTRAN Take 4 g by mouth daily.   ciprofloxacin  500 MG tablet Commonly known as: Cipro  Take 1 tablet (500 mg total) by mouth 2 (two) times daily for 4 days.   ferrous sulfate 325 (65 FE) MG tablet Take 325 mg by mouth daily with breakfast.   finasteride  5 MG tablet Commonly known as: PROSCAR  TAKE ONE TABLET EVERY DAY   levothyroxine  125 MCG tablet Commonly known as: SYNTHROID  Take 125 mcg by mouth daily.   loperamide  2 MG capsule Commonly known as: IMODIUM  Take 6 mg by mouth in the morning, at noon, and at bedtime.   losartan 50 MG tablet Commonly known as: COZAAR Take 50 mg by mouth daily.   montelukast  10 MG tablet Commonly known as: SINGULAIR  Take 1 tablet by mouth daily.   Multi-Vitamin tablet Take 1 tablet by mouth daily.   oxyCODONE  5 MG immediate release tablet Commonly known as: Oxy IR/ROXICODONE  Take 1-2 tablets (5-10 mg total) by mouth every 6 (six) hours as needed for severe pain (pain score 7-10) or breakthrough pain.   pantoprazole  40 MG tablet Commonly known as: Protonix  Take 1 tablet (40 mg total) by mouth 2 (two) times daily before a meal for 14 days.   potassium citrate  10 MEQ (1080 MG) SR tablet Commonly known as: UROCIT-K  Take 10 mEq by mouth 4 (four) times daily.   potassium citrate  10 MEQ (1080 MG) SR tablet Commonly known as: UROCIT-K  Take 10 mEq by mouth 4 (four) times daily.    sucralfate  1 g tablet Commonly known as: CARAFATE  Take 1 g by mouth in the morning, at noon, and at bedtime.   tamsulosin  0.4 MG Caps capsule Commonly known as: FLOMAX  TAKE 1 CAPSULE BY MOUTH ONCE DAILY          Contact information for follow-up providers     Quasean Frye R, PA-C. Go on 06/19/2024.   Specialty: Physician Assistant Why: Go to appointment on 10/15 at 400 PM Contact information: [address] [place] [address] [phone]         [facility] [place]. Go on 06/14/2024.   Specialty: Urology Why: Call office to scheulde cystogram within 7 days.  Foley in place for bladder repain intra-operativel. The office is close you will have to call back on Monday to make appointment. Contact information: [address] [place]  [address] [phone]             Contact information for after-discharge care     Home Medical Care     [facility] .   Service: Home Health Services Contact information: [phone] [place]  [address] [phone]                      Time spent on discharge management including discussion of hospital  course, clinical condition, outpatient instructions, prescriptions, and follow up with the patient and members of the medical team: >30 minutes  -- Arthea Platt , PA-C [facility]  06/14/2024, 12:10 PM [phone] M-F: 7am - 4pm

## 2024-06-14 NOTE — Progress Notes (Signed)
 Physical Therapy Treatment Patient Details Name: Stephen Cervantes MRN: [id number] DOB: 31-Jan-1941 Today's Date: 06/14/2024   History of Present Illness Stephen Cervantes is a 83 y.o. male presenting to clinic for follow up of upper abdominal pain.  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  It was determined that patient had infection along mesh and is s/p Exploratory Laparotomy, extensive lysis of adhesions, excision of infected mesh, small bowel resection with anastomosis, open cholecystectomy, bladder repair, recurrent ventral hernia repair with bilateral myocutaneous flaps with anterior release on 06/07/2024.    PT Comments  The patient was eager to participate with PT. Significant improvement with independence with mobility and overall activity tolerance this session. Patient ambulated in hallway with supervision using rolling walker. He and the spouse want to discharge home given improved mobility. DME in place at home with recommendation for continued rolling walker use with mobility for safety. PT will continue to follow while in the hospital.    If plan is discharge home, recommend the following: Assist for transportation;Assistance with cooking/housework;A little help with bathing/dressing/bathroom   Can travel by private vehicle        Equipment Recommendations  None recommended by PT    Recommendations for Other Services       Precautions / Restrictions Precautions Precautions: Fall Recall of Precautions/Restrictions: Intact Precaution/Restrictions Comments: multiple JP drains, foley catheter, wound vac Restrictions Weight Bearing Restrictions Per Provider Order: No     Mobility  Bed Mobility Overal bed mobility: Needs Assistance Bed Mobility: Supine to Sit, Sit to Supine     Supine to sit: Supervision Sit to supine: Min assist    General bed mobility comments: intermittent LE support provided to get back to bed to avoid increased pain with movement    Transfers Overall transfer level: Needs assistance Equipment used: Rolling walker (2 wheels) Transfers: Sit to/from Stand Sit to Stand: Supervision           General transfer comment: good safety awareness with transfers    Ambulation/Gait Ambulation/Gait assistance: Supervision Gait Distance (Feet): 120 Feet Assistive device: Rolling walker (2 wheels) Gait Pattern/deviations: Step-through pattern Gait velocity: decreased     General Gait Details: no loss of balance or dizziness with hallway ambulation using rolling walker.   Stairs Stairs:  (education provided on sequencing going up steps safely given right lower abdomen/groin pain)           Wheelchair Mobility     Tilt Bed    Modified Rankin (Stroke Patients Only)       Balance Overall balance assessment: Needs assistance Sitting-balance support: No upper extremity supported       Standing balance support: Bilateral upper extremity supported Standing balance-Leahy Scale: Fair Standing balance comment: using rolling walker for support                            Communication Communication Communication: No apparent difficulties  Cognition Arousal: Alert Behavior During Therapy: WFL for tasks assessed/performed   PT - Cognitive impairments: No apparent impairments                         Following commands: Intact      Cueing Cueing Techniques: Verbal cues  Exercises      General Comments General comments (skin integrity, edema, etc.): tips provided  on securing drains, wound vac, and foley with mobility for safety      Pertinent Vitals/Pain Pain Assessment Pain Assessment: Faces Faces Pain Scale: Hurts a little bit Pain Location: right lower abdomen Pain Descriptors / Indicators: Discomfort Pain Intervention(s): Limited activity within  patient's tolerance, Monitored during session, Repositioned    Home Living                          Prior Function            PT Goals (current goals can now be found in the care plan section) Acute Rehab PT Goals Patient Stated Goal: to get stronger PT Goal Formulation: With patient Time For Goal Achievement: 06/22/24 Potential to Achieve Goals: Good Progress towards PT goals: Progressing toward goals    Frequency    Min 3X/week      PT Plan      Co-evaluation              AM-PAC PT 6 Clicks Mobility   Outcome Measure  Help needed turning from your back to your side while in a flat bed without using bedrails?: A Little Help needed moving from lying on your back to sitting on the side of a flat bed without using bedrails?: A Little Help needed moving to and from a bed to a chair (including a wheelchair)?: A Little Help needed standing up from a chair using your arms (e.g., wheelchair or bedside chair)?: A Little Help needed to walk in hospital room?: A Little Help needed climbing 3-5 steps with a railing? : A Little 6 Click Score: 18    End of Session   Activity Tolerance: Patient tolerated treatment well Patient left: in bed;with call bell/phone within reach Nurse Communication: Mobility status PT Visit Diagnosis: Unsteadiness on feet (R26.81);Muscle weakness (generalized) (M62.81)     Time: 0915-1000 PT Time Calculation (min) (ACUTE ONLY): 45 min  Charges:    $Therapeutic Activity: 38-52 mins PT General Charges $$ ACUTE PT VISIT: 1 Visit                    Randine Essex, PT, MPT    Randine LULLA Essex 06/14/2024, 11:55 AM

## 2024-06-14 NOTE — Plan of Care (Signed)

## 2024-06-15 ENCOUNTER — Other Ambulatory Visit: Payer: Self-pay

## 2024-06-17 ENCOUNTER — Telehealth: Payer: Self-pay | Admitting: Surgery

## 2024-06-17 NOTE — Telephone Encounter (Signed)
 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 Please advise Pt # [phone]

## 2024-06-17 NOTE — Telephone Encounter (Signed)
 Called and spoke with the patient and let him know to contact Urology about this as they may need to change out his catheter.

## 2024-06-19 ENCOUNTER — Ambulatory Visit: Admitting: Surgery

## 2024-06-19 ENCOUNTER — Encounter: Payer: Self-pay | Admitting: Physician Assistant

## 2024-06-19 VITALS — BP 113/71 | HR 97 | Temp 98.0°F | Ht 69.0 in | Wt 175.0 lb

## 2024-06-19 DIAGNOSIS — K8020 Calculus of gallbladder without cholecystitis without obstruction: Secondary | ICD-10-CM

## 2024-06-19 DIAGNOSIS — L02211 Cutaneous abscess of abdominal wall: Secondary | ICD-10-CM

## 2024-06-19 DIAGNOSIS — Z09 Encounter for follow-up examination after completed treatment for conditions other than malignant neoplasm: Secondary | ICD-10-CM

## 2024-06-19 DIAGNOSIS — Z8719 Personal history of other diseases of the digestive system: Secondary | ICD-10-CM

## 2024-06-19 NOTE — Patient Instructions (Signed)
 We removed all of your drains in the office. For the open wound continue to pack it. Keep the gauze clean and dry. You may shower like normal, let the soap and water run over the wound.   We will see you back in 2 weeks. If you have any questions or concerns please don't hesitate to call the office   Wound Packing Wound packing usually involves placing a moistened packing material into your wound and then covering it with an outer bandage (dressing). This helps support the healing of deep tissue and tissue under the skin. It also helps prevent bleeding, infection, and further injury. Wounds are packed until deep tissues heal. The time it takes for this to happen is different for everyone. Your health care provider will show you how to pack and dress your wound. Using gloves and a clean technique is important to avoid spreading germs into your wound. Supplies needed: Soap and water. Disposable gloves. Cleansing or wetting solution, such as saline, germ-free (sterile) water, or an antiseptic solution. Clean bowl. Clean packing material, such as gauze, gauze sponges, or rolled gauze. Clean paper towels. Outer dressing. This includes the cover dressing and tape, or a dressing with an adhesive border. Cotton-tipped swabs. Small plastic bag for trash. How to pack your wound Follow your health care provider's instructions on how often you need to change dressings and pack your wound. You will likely be asked to change your dressings 1 to 2 times a day. Preparing to change the wound packing If needed, take pain medicine 30 minutes before you pack your wound as told by your health care provider. Preparing the new packing material  Clean and disinfect your work surface or countertop. Set a plastic bag on or near your work surface. Wash your hands with soap and water for at least 20 seconds before you change the dressing. If soap and water are not available, use hand sanitizer. Put a clean paper towel  on the counter. Put a clean bowl on the towel. Only touch the outside of the bowl when handling it. Pour the cleansing or wetting solution that your health care provider tells you to use into the bowl. Select and cut your packing material to fit the size of your wound. Avoid using multiple pieces of packing material. Drop it into the bowl. Cut tape strips that you will use to seal the outer dressing, if needed. Put gauze pads for cleansing and cotton-tipped swabs on the clean paper towel. Removing the old packing material and dressing Put on a set of gloves. Gently remove the old dressing and packing material. Make sure to check how the drainage looks or if there is any odor. Clean or rinse (irrigate) the wound. Remove your gloves. Put the removed items, including gloves, into the plastic bag to throw away later. Wash your hands again with soap and water for at least 20 seconds. If soap and water are not available, use hand sanitizer. Applying the new packing material and dressing  Put on a new set of gloves. Squeeze the packing material in the bowl to release the extra liquid. The packing material should be moist, but not dripping wet. Gently place the packing material into the wound. Use a cotton-tipped swab to guide it into place, filling all of the space. Do not overpack the wound bed. Dry your gloved fingertips on the paper towel. Open up your outer dressing supplies and put them on a dry part of the paper towel. Keep them from  getting wet. Place the outer dressing over the packed wound. Tape the edges of the outer dressing in place. Remove your gloves. Wash your hands again with soap and water for at least 20 seconds. If soap and water are not available, use hand sanitizer. Put the removed items, including gloves, into the plastic bag to throw away. Clean and disinfect your work surface or countertop. General tips Follow your health care provider's instructions on how much to pack  the wound. At first, you may need to pack it more fully to help stop bleeding. As the wound begins to heal inside, you will use less packing material and pack the wound loosely to allow the tissue to heal slowly from the inside out. Do not take baths, swim, or use a hot tub until your health care provider approves. Ask your health care provider if you may take showers. You may only be allowed to take sponge baths. Keep the dressing clean and dry. Follow any other instructions given by your health care provider on how to aid healing. This may include applying warm or cold compresses, raising (elevating) the affected area, or wearing a compression dressing. Check your wound site every day for signs of infection. Check for: More redness, swelling, or pain. More fluid or blood. Warmth or hardness (induration). Pus or a bad smell. Protect your wound from the sun when you are outside for the first 6 months, or for as long as told by your health care provider. Cover up the scar area or apply sunscreen that has an SPF of at least 30. Keep all follow-up visits. This is important. Contact a health care provider if: Your pain is not controlled with pain medicine. You have more drainage, redness, swelling, or pain at your wound site. You have new rash, warmth, or induration around the wound. You have a fever or chills. Your wound becomes larger or deeper. Get help right away if: The tissue inside your wound changes color from pink to white, yellow, or black. You notice a bad smell or pus coming from the wound site. You are having trouble packing your wound. Your wound is bleeding, and the bleeding does not stop with gentle pressure. These symptoms may represent a serious problem that is an emergency. Do not wait to see if the symptoms will go away. Get medical help right away. Call your local emergency services (911 in the [place] Do not drive yourself to the hospital. Summary Wound packing usually  involves placing a moistened packing material into your wound and then covering it with an outer bandage (dressing). Follow your health care provider's instructions on how often you need to change dressings and pack your wound. You will likely be asked to change dressings 1 to 2 times a day. When packing your wound, it is important to use gloves to avoid spreading germs into the wound. Check your wound site every day for signs of infection. This information is not intended to replace advice given to you by your health care provider. Make sure you discuss any questions you have with your health care provider. Document Revised: 12/29/2020 Document Reviewed: 12/29/2020 Elsevier Patient Education  2024 [employer].

## 2024-06-19 NOTE — Progress Notes (Signed)
 06/19/2024  History of Present Illness: Stephen Cervantes is a 83 y.o. male status post exploratory laparotomy, extensive lysis of adhesions, excision of infected mesh, debridement of midline skin wound/abscess cavity, small bowel resection with anastomosis, open cholecystectomy, bladder repair, recurrent ventral hernia repair with bilateral myocutaneous flaps with anterior release on 06/07/2024.  Patient presents today for follow-up.  He reports that the drains have been working well and the output has been decreasing over the last few days.  No issues with the Prevena VAC.  He has been slow to move but reports some improvement.  His appetite has been low but he is trying to eat.  He is back to his baseline dosage for Imodium  which helps him with his diarrhea.  His ankles are swollen but denies any pain in his calves or legs.  Past Medical History: Past Medical History:  Diagnosis Date   Arthritis    Asthma    Bowel obstruction (HCC) 2011,2015   Depression    Diverticulitis 2006   Enlarged prostate    Gallstones 07/04/2014   History of gastrointestinal ulcer    History of kidney stones    HLD (hyperlipidemia)    HTN (hypertension)    Kidney stone    kidney stones   Neuritis or radiculitis due to rupture of lumbar intervertebral disc 05/29/2015   Thyroid disease      Past Surgical History: Past Surgical History:  Procedure Laterality Date   ABDOMINAL WALL DEFECT REPAIR N/A 06/07/2024   Procedure: REPAIR, ABDOMINAL WALL;  Surgeon: Desiderio Schanz, MD;  Location: [facility];  Service: General;  Laterality: N/A;   BOWEL RESECTION  06/07/2024   Procedure: EXCISION, SMALL INTESTINE;  Surgeon: Desiderio Schanz, MD;  Location: [facility];  Service: General;;   CHOLECYSTECTOMY N/A 06/07/2024   Procedure: CHOLECYSTECTOMY;  Surgeon: Desiderio Schanz, MD;  Location: [facility];  Service: General;  Laterality: N/A;   COLECTOMY  09/05/2004   Abdominal colectomy, ileostomy, Hartman procedure for perforation of  cecum, sigmoid diverticulitis   ESOPHAGOGASTRODUODENOSCOPY N/A 05/30/2024   Procedure: EGD (ESOPHAGOGASTRODUODENOSCOPY);  Surgeon: [name], MD;  Location: [facility] [place] [facility];  Service: Gastroenterology;  Laterality: N/A;   EXCISION OF MESH N/A 06/07/2024   Procedure: REMOVAL, MESH, ABDOMEN OR PELVIS;  Surgeon: Desiderio Schanz, MD;  Location: [facility];  Service: General;  Laterality: N/A;   HERNIA REPAIR  2008?   [facility]: [facility]   ILEOSTOMY  09/05/2005   JOINT REPLACEMENT  09/05/2012   Hip Replacement [name]   KNEE SURGERY Right 09/05/2009   POSTERIOR LUMBAR FUSION 4 LEVEL N/A 03/13/2019   Procedure: POSTERIOR LUMBAR FUSION 4 LEVEL L1-5;  Surgeon: Clois Fret, MD;  Location: [facility];  Service: Neurosurgery;  Laterality: N/A;   TRIGGER FINGER RELEASE Right 05/05/2021   Procedure: RELEASE TRIGGER FINGER/A-1 PULLEY;  Surgeon: Edie Norleen PARAS, MD;  Location: [facility];  Service: Orthopedics;  Laterality: Right;    Home Medications: Prior to Admission medications   Medication Sig Start Date End Date Taking? Authorizing Provider  atorvastatin  (LIPITOR) 10 MG tablet Take 10 mg by mouth daily.     [provider]  celecoxib  (CELEBREX ) 200 MG capsule Take 1 capsule (200 mg total) by mouth daily. 03/13/23   Hilma Hastings, PA-C  ferrous sulfate 325 (65 FE) MG tablet Take 325 mg by mouth daily with breakfast.    [provider]  finasteride  (PROSCAR ) 5 MG tablet TAKE ONE TABLET EVERY DAY 01/12/24   McGowan, Clotilda A, PA-C  levothyroxine  (  SYNTHROID ) 125 MCG tablet Take 125 mcg by mouth daily. 03/05/24   [provider]  loperamide  (IMODIUM ) 2 MG capsule Take 6 mg by mouth in the morning, at noon, and at bedtime.    [provider]  losartan (COZAAR) 50 MG tablet Take 50 mg by mouth daily. 03/05/24   [provider]  montelukast  (SINGULAIR ) 10 MG tablet Take 1 tablet by mouth daily. 06/16/23   [provider]  Multiple Vitamin  (MULTI-VITAMIN) tablet Take 1 tablet by mouth daily.    [provider]  oxyCODONE  (OXY IR/ROXICODONE ) 5 MG immediate release tablet Take 1-2 tablets (5-10 mg total) by mouth every 6 (six) hours as needed for severe pain (pain score 7-10) or breakthrough pain. 06/14/24   [name], [name], PA-C  pantoprazole  (PROTONIX ) 40 MG tablet Take 1 tablet (40 mg total) by mouth 2 (two) times daily before a meal for 14 days. 03/24/24 06/06/24  [name], MD  potassium citrate  (UROCIT-K ) 10 MEQ (1080 MG) SR tablet Take 10 mEq by mouth 4 (four) times daily. 05/01/24   [provider]  sucralfate  (CARAFATE ) 1 g tablet Take 1 g by mouth in the morning, at noon, and at bedtime.    [provider]  tamsulosin  (FLOMAX ) 0.4 MG CAPS capsule TAKE 1 CAPSULE BY MOUTH ONCE DAILY 01/12/24   Helon Kirsch A, PA-C    Allergies: Allergies  Allergen Reactions   Morphine And Codeine Other (See Comments)    Severe hallucinations   Tape Other (See Comments)    Silk Tape per patient peels off my skin    Review of Systems: Review of Systems  Constitutional:  Negative for chills and fever.  Respiratory:  Negative for shortness of breath.   Cardiovascular:  Negative for chest pain.  Gastrointestinal:  Positive for abdominal pain and diarrhea. Negative for nausea and vomiting.    Physical Exam BP 113/71   Pulse 97   Temp 98 F (36.7 C) (Oral)   Ht 5' 9 (1.753 m)   Wt 175 lb (79.4 kg)   SpO2 97%   BMI 25.84 kg/m  CONSTITUTIONAL: No acute distress HEENT:  Normocephalic, atraumatic, extraocular motion intact. RESPIRATORY:  Normal respiratory effort without pathologic use of accessory muscles. CARDIOVASCULAR: Regular rhythm and rate GI: The abdomen is soft, nondistended, appropriately tender to palpation.  The patient's right lower quadrant drain going into the pelvis was removed today without any complications which only had serosanguineous fluid.  The left upper quadrant  subcutaneous drain was removed also only containing sanguinous fluid.  The left lower quadrant subcutaneous drain was also removed containing dark serosanguineous fluid.  All of these had low volume output over the last few days.  Dry gauze dressing applied over all 3 sites.  The Prevena VAC was also removed today revealing a midline wound with intermittent openings as had been left intraoperatively with iodoform packing.  Packing was removed today and replaced with new half-inch iodoform gauze with a dry gauze dressing on top.  MUSCULOSKELETAL: 1+ bilateral ankle edema. NEUROLOGIC:  Motor and sensation is grossly normal.  Cranial nerves are grossly intact. PSYCH:  Alert and oriented to person, place and time. Affect is normal.   Assessment and Plan: This is a 83 y.o. male status post exploratory laparotomy, extensive lysis of adhesions, excision of infected mesh, debridement of midline skin wound/abscess cavity, small bowel resection with anastomosis, open cholecystectomy, bladder repair, recurrent ventral hernia repair with bilateral myocutaneous flaps with anterior release  -All drains  and the Prevena VAC were removed today without complications.  Instructed the patient on iodoform gauze packing of the midline wound and other dressing changes.  Discussed with the patient that he may shower without restrictions but should not submerge the midline wound area. - Discussed with him activity restrictions and to continue wearing abdominal binder. - Encouraged him to increase his p.o. intake and at least increase his protein intake. - Patient has follow-up with urology next week for Foley catheter removal. - Follow-up with me in 2 weeks.  I spent 20 minutes dedicated to the care of this patient on the date of this encounter to include pre-visit review of records, face-to-face time with the patient discussing diagnosis and management, and any post-visit coordination of care.   Aloysius Sheree Plant,  MD [facility]

## 2024-06-27 ENCOUNTER — Ambulatory Visit: Admitting: Urology

## 2024-06-27 VITALS — BP 133/68 | HR 96

## 2024-06-27 DIAGNOSIS — R319 Hematuria, unspecified: Secondary | ICD-10-CM

## 2024-06-27 DIAGNOSIS — S3720XD Unspecified injury of bladder, subsequent encounter: Secondary | ICD-10-CM

## 2024-06-27 MED ORDER — LIDOCAINE HCL URETHRAL/MUCOSAL 2 % EX GEL: 1.0000 | Freq: Once | CUTANEOUS | Status: DC

## 2024-06-27 NOTE — Progress Notes (Signed)
 Recent exploratory laparotomy with general surgery on 06/07/2024, small bladder injury that was repaired by general surgery.  He was supposed to be scheduled today for cystogram and Foley removal but this was unfortunately not scheduled correctly  Unable to have imaging done today.  Will schedule tomorrow morning and have follow-up for Foley catheter removal if no evidence of leak  Redell Burnet, MD 06/27/2024

## 2024-06-28 ENCOUNTER — Ambulatory Visit
Admission: RE | Admit: 2024-06-28 | Discharge: 2024-06-28 | Disposition: A | Discharge status: Home / Self Care | Source: Ambulatory Visit | Attending: Urology | Admitting: Urology

## 2024-06-28 ENCOUNTER — Ambulatory Visit (INDEPENDENT_AMBULATORY_CARE_PROVIDER_SITE_OTHER): Admitting: Physician Assistant

## 2024-06-28 DIAGNOSIS — X58XXXA Exposure to other specified factors, initial encounter: Secondary | ICD-10-CM | POA: Insufficient documentation

## 2024-06-28 DIAGNOSIS — S3720XD Unspecified injury of bladder, subsequent encounter: Secondary | ICD-10-CM

## 2024-06-28 DIAGNOSIS — S3720XA Unspecified injury of bladder, initial encounter: Secondary | ICD-10-CM | POA: Insufficient documentation

## 2024-06-28 MED ORDER — SULFAMETHOXAZOLE-TRIMETHOPRIM 800-160 MG PO TABS
1.0000 | ORAL_TABLET | Freq: Once | ORAL | Status: AC
  Administered 2024-06-28: 1 via ORAL

## 2024-06-28 NOTE — Progress Notes (Signed)
 Cystogram performed earlier today.  Dr. Georganne and I both reviewed it and see no evidence of persistent bladder leak.  We administered 1 dose Bactrim  DS in clinic prior to Foley removal and then catheter was removed, see separate note.  Return for Keep follow-up as scheduled.

## 2024-06-28 NOTE — Progress Notes (Signed)
 Catheter Removal  Patient is present today for a catheter removal.  8.46ml of water was drained from the balloon. A 169FR foley cath was removed from the bladder, no complications were noted. Patient tolerated well.  Performed by: Laymon Ned, CMA  Follow up/ Additional notes: Return for Keep follow-up as scheduled.

## 2024-07-05 ENCOUNTER — Encounter: Payer: Self-pay | Admitting: Surgery

## 2024-07-05 ENCOUNTER — Ambulatory Visit: Admitting: Surgery

## 2024-07-05 VITALS — BP 128/78 | HR 80 | Ht 69.0 in | Wt 174.0 lb

## 2024-07-05 DIAGNOSIS — Z09 Encounter for follow-up examination after completed treatment for conditions other than malignant neoplasm: Secondary | ICD-10-CM

## 2024-07-05 DIAGNOSIS — Z8719 Personal history of other diseases of the digestive system: Secondary | ICD-10-CM

## 2024-07-05 DIAGNOSIS — L02211 Cutaneous abscess of abdominal wall: Secondary | ICD-10-CM

## 2024-07-05 DIAGNOSIS — K8020 Calculus of gallbladder without cholecystitis without obstruction: Secondary | ICD-10-CM

## 2024-07-05 NOTE — Patient Instructions (Signed)
 Keep wearing your binder. Daily dressing changes until the area closes up and stops draining.    Follow-up with our office in 1 month.  Please call and ask to speak with a nurse if you develop questions or concerns.

## 2024-07-08 ENCOUNTER — Encounter: Payer: Self-pay | Admitting: Surgery

## 2024-07-08 NOTE — Progress Notes (Signed)
 07/05/2024  History of Present Illness: Stephen Cervantes is a 83 y.o. male status post exploratory laparotomy, extensive lysis of adhesions, excision of infected mesh, debridement of midline skin wound/abscess cavity, small bowel resection with anastomosis, open cholecystectomy, bladder repair, recurrent ventral hernia repair with bilateral myocutaneous flaps with anterior release on 06/07/2024.  Patient presents today for follow-up.  On his last visit, all drains were removed and the prevena vac was removed.  He reports he has been doing well overall.  Reports ups and downs in his day to day, some days feeling better, and some days feeling more worn down.  Appetite is still back to baseline, though his weight is stable.  His significant other has been doing dressing changes in the midline wound with 1/2 inch iodoform and gauze.  She reports that the midline wound is healing well.  Past Medical History: Past Medical History:  Diagnosis Date   Arthritis    Asthma    Bowel obstruction (HCC) 2011,2015   Depression    Diverticulitis 2006   Enlarged prostate    Gallstones 07/04/2014   History of gastrointestinal ulcer    History of kidney stones    HLD (hyperlipidemia)    HTN (hypertension)    Kidney stone    kidney stones   Neuritis or radiculitis due to rupture of lumbar intervertebral disc 05/29/2015   Thyroid disease      Past Surgical History: Past Surgical History:  Procedure Laterality Date   ABDOMINAL WALL DEFECT REPAIR N/A 06/07/2024   Procedure: REPAIR, ABDOMINAL WALL;  Surgeon: Desiderio Schanz, MD;  Location: [facility];  Service: General;  Laterality: N/A;   BOWEL RESECTION  06/07/2024   Procedure: EXCISION, SMALL INTESTINE;  Surgeon: Desiderio Schanz, MD;  Location: [facility];  Service: General;;   CHOLECYSTECTOMY N/A 06/07/2024   Procedure: CHOLECYSTECTOMY;  Surgeon: Desiderio Schanz, MD;  Location: [facility];  Service: General;  Laterality: N/A;   COLECTOMY  09/05/2004   Abdominal  colectomy, ileostomy, Hartman procedure for perforation of cecum, sigmoid diverticulitis   ESOPHAGOGASTRODUODENOSCOPY N/A 05/30/2024   Procedure: EGD (ESOPHAGOGASTRODUODENOSCOPY);  Surgeon: [name], MD;  Location: [facility] ENDOSCOPY;  Service: Gastroenterology;  Laterality: N/A;   EXCISION OF MESH N/A 06/07/2024   Procedure: REMOVAL, MESH, ABDOMEN OR PELVIS;  Surgeon: Desiderio Schanz, MD;  Location: [facility];  Service: General;  Laterality: N/A;   HERNIA REPAIR  2008?   [place] [facility]   ILEOSTOMY  09/05/2005   JOINT REPLACEMENT  09/05/2012   Hip Replacement [name]   KNEE SURGERY Right 09/05/2009   POSTERIOR LUMBAR FUSION 4 LEVEL N/A 03/13/2019   Procedure: POSTERIOR LUMBAR FUSION 4 LEVEL L1-5;  Surgeon: Clois Fret, MD;  Location: [facility];  Service: Neurosurgery;  Laterality: N/A;   TRIGGER FINGER RELEASE Right 05/05/2021   Procedure: RELEASE TRIGGER FINGER/A-1 PULLEY;  Surgeon: Edie Norleen PARAS, MD;  Location: [facility] OR[facility];  Service: Orthopedics;  Laterality: Right;    Home Medications: Prior to Admission medications   Medication Sig Start Date End Date Taking? Authorizing Provider  atorvastatin  (LIPITOR) 10 MG tablet Take 10 mg by mouth daily.     [provider]  celecoxib  (CELEBREX ) 200 MG capsule Take 1 capsule (200 mg total) by mouth daily. 03/13/23   Hilma Hastings, PA-C  ferrous sulfate 325 (65 FE) MG tablet Take 325 mg by mouth daily with breakfast.    [provider]  finasteride  (PROSCAR ) 5 MG tablet TAKE ONE TABLET EVERY DAY 01/12/24   McGowan,  Shannon A, PA-C  levothyroxine  (SYNTHROID ) 125 MCG tablet Take 125 mcg by mouth daily. 03/05/24   [provider]  loperamide  (IMODIUM ) 2 MG capsule Take 6 mg by mouth in the morning, at noon, and at bedtime.    [provider]  losartan (COZAAR) 50 MG tablet Take 50 mg by mouth daily. 03/05/24   [provider]  montelukast  (SINGULAIR ) 10 MG tablet Take 1 tablet by mouth  daily. 06/16/23   [provider]  Multiple Vitamin (MULTI-VITAMIN) tablet Take 1 tablet by mouth daily.    [provider]  oxyCODONE  (OXY IR/ROXICODONE ) 5 MG immediate release tablet Take 1-2 tablets (5-10 mg total) by mouth every 6 (six) hours as needed for severe pain (pain score 7-10) or breakthrough pain. 06/14/24   [name], [name], PA-C  pantoprazole  (PROTONIX ) 40 MG tablet Take 1 tablet (40 mg total) by mouth 2 (two) times daily before a meal for 14 days. 03/24/24 06/06/24  [name], MD  potassium citrate  (UROCIT-K ) 10 MEQ (1080 MG) SR tablet Take 10 mEq by mouth 4 (four) times daily. 05/01/24   [provider]  sucralfate  (CARAFATE ) 1 g tablet Take 1 g by mouth in the morning, at noon, and at bedtime.    [provider]  tamsulosin  (FLOMAX ) 0.4 MG CAPS capsule TAKE 1 CAPSULE BY MOUTH ONCE DAILY 01/12/24   Helon Kirsch A, PA-C    Allergies: Allergies  Allergen Reactions   Morphine And Codeine Other (See Comments)    Severe hallucinations   Tape Other (See Comments)    Silk Tape per patient peels off my skin    Review of Systems: Review of Systems  Constitutional:  Negative for chills and fever.  Respiratory:  Negative for shortness of breath.   Cardiovascular:  Negative for chest pain.  Gastrointestinal:  Negative for abdominal pain, nausea and vomiting.    Physical Exam BP 128/78   Pulse 80   Ht 5' 9 (1.753 m)   Wt 174 lb (78.9 kg)   SpO2 95%   BMI 25.70 kg/m  CONSTITUTIONAL: No acute distress HEENT:  Normocephalic, atraumatic, extraocular motion intact. RESPIRATORY:  Normal respiratory effort without pathologic use of accessory muscles. CARDIOVASCULAR: Regular rhythm and rate GI: The abdomen is soft, nondistended, appropriately sore to palpation.  The midline wound is healing well, with very shallow wounds, with healthy granulation tissue.  Patient had one PDS suture that was sticking out.  This was trimmed down.  1/2 inch  iodoform placed over the wounds and covered with dry gauze dressing/tape. MUSCULOSKELETAL: No significant edema noted. NEUROLOGIC:  Motor and sensation is grossly normal.  Cranial nerves are grossly intact. PSYCH:  Alert and oriented to person, place and time. Affect is normal.   Assessment and Plan: This is a 83 y.o. male status post exploratory laparotomy, extensive lysis of adhesions, excision of infected mesh, debridement of midline skin wound/abscess cavity, small bowel resection with anastomosis, open cholecystectomy, bladder repair, recurrent ventral hernia repair with bilateral myocutaneous flaps with anterior release  -Discussed with the patient that it is not uncommon to have ups and downs during the recovery process.  He is just 4 weeks out from surgery and he had a major surgery.  This will continue to improve.   --Continue dressing changes as currently doing.  Soon the patient will only need the dry gauze dressing as his wounds are very shallow.   --Continue wearing the abdominal binder for the next two weeks.  After 6 weeks  from surgery, he may continue wearing it for comfort. - Follow-up with me in 4 weeks.  I spent 20 minutes dedicated to the care of this patient on the date of this encounter to include pre-visit review of records, face-to-face time with the patient discussing diagnosis and management, and any post-visit coordination of care.   Aloysius Sheree Plant, MD [facility]

## 2024-07-12 ENCOUNTER — Emergency Department

## 2024-07-12 ENCOUNTER — Inpatient Hospital Stay
Admission: EM | Admit: 2024-07-12 | Discharge: 2024-07-16 | DRG: 920 | Disposition: A | Discharge status: Home / Self Care | Attending: Student in an Organized Health Care Education/Training Program | Admitting: Student in an Organized Health Care Education/Training Program

## 2024-07-12 ENCOUNTER — Other Ambulatory Visit: Payer: Self-pay

## 2024-07-12 DIAGNOSIS — Z9049 Acquired absence of other specified parts of digestive tract: Secondary | ICD-10-CM

## 2024-07-12 DIAGNOSIS — Z8719 Personal history of other diseases of the digestive system: Secondary | ICD-10-CM

## 2024-07-12 DIAGNOSIS — L7634 Postprocedural seroma of skin and subcutaneous tissue following other procedure: Secondary | ICD-10-CM | POA: Diagnosis not present

## 2024-07-12 DIAGNOSIS — S3011XA Contusion of abdominal wall, initial encounter: Secondary | ICD-10-CM | POA: Diagnosis not present

## 2024-07-12 DIAGNOSIS — N400 Enlarged prostate without lower urinary tract symptoms: Secondary | ICD-10-CM | POA: Diagnosis present

## 2024-07-12 DIAGNOSIS — R197 Diarrhea, unspecified: Secondary | ICD-10-CM | POA: Diagnosis not present

## 2024-07-12 DIAGNOSIS — Y832 Surgical operation with anastomosis, bypass or graft as the cause of abnormal reaction of the patient, or of later complication, without mention of misadventure at the time of the procedure: Secondary | ICD-10-CM | POA: Diagnosis present

## 2024-07-12 DIAGNOSIS — Z885 Allergy status to narcotic agent status: Secondary | ICD-10-CM

## 2024-07-12 DIAGNOSIS — Z79899 Other long term (current) drug therapy: Secondary | ICD-10-CM

## 2024-07-12 DIAGNOSIS — G8918 Other acute postprocedural pain: Secondary | ICD-10-CM | POA: Diagnosis present

## 2024-07-12 DIAGNOSIS — Z79890 Hormone replacement therapy (postmenopausal): Secondary | ICD-10-CM

## 2024-07-12 DIAGNOSIS — I10 Essential (primary) hypertension: Secondary | ICD-10-CM | POA: Diagnosis present

## 2024-07-12 DIAGNOSIS — N179 Acute kidney failure, unspecified: Secondary | ICD-10-CM | POA: Diagnosis present

## 2024-07-12 DIAGNOSIS — E039 Hypothyroidism, unspecified: Secondary | ICD-10-CM | POA: Diagnosis present

## 2024-07-12 DIAGNOSIS — Z981 Arthrodesis status: Secondary | ICD-10-CM

## 2024-07-12 DIAGNOSIS — Z91048 Other nonmedicinal substance allergy status: Secondary | ICD-10-CM

## 2024-07-12 DIAGNOSIS — Z87442 Personal history of urinary calculi: Secondary | ICD-10-CM

## 2024-07-12 DIAGNOSIS — Z87891 Personal history of nicotine dependence: Secondary | ICD-10-CM

## 2024-07-12 DIAGNOSIS — D649 Anemia, unspecified: Secondary | ICD-10-CM | POA: Diagnosis present

## 2024-07-12 DIAGNOSIS — E785 Hyperlipidemia, unspecified: Secondary | ICD-10-CM | POA: Diagnosis present

## 2024-07-12 HISTORY — DX: Encounter for other specified aftercare: Z51.89

## 2024-07-12 LAB — URINALYSIS, ROUTINE W REFLEX MICROSCOPIC
Bilirubin Urine: NEGATIVE
Glucose, UA: NEGATIVE mg/dL
Hgb urine dipstick: NEGATIVE
Ketones, ur: NEGATIVE mg/dL
Nitrite: NEGATIVE
Protein, ur: 30 mg/dL — AB
Specific Gravity, Urine: 1.017 (ref 1.005–1.030)
Squamous Epithelial / HPF: 0 /HPF (ref 0–5)
WBC, UA: >50 WBC/hpf (ref 0–5)
pH: 6 (ref 5.0–8.0)

## 2024-07-12 LAB — CBC
HCT: 26.6 % — ABNORMAL LOW (ref 39.0–52.0)
Hemoglobin: 8.3 g/dL — ABNORMAL LOW (ref 13.0–17.0)
MCH: 25.9 pg — ABNORMAL LOW (ref 26.0–34.0)
MCHC: 31.2 g/dL (ref 30.0–36.0)
MCV: 82.9 fL (ref 80.0–100.0)
Platelets: 334 K/uL (ref 150–400)
RBC: 3.21 MIL/uL — ABNORMAL LOW (ref 4.22–5.81)
RDW: 16.7 % — ABNORMAL HIGH (ref 11.5–15.5)
WBC: 8.6 K/uL (ref 4.0–10.5)
nRBC: 0 % (ref 0.0–0.2)

## 2024-07-12 LAB — LIPASE, BLOOD: Lipase: 26 U/L (ref 11–51)

## 2024-07-12 LAB — COMPREHENSIVE METABOLIC PANEL WITH GFR
ALT: 13 U/L (ref 0–44)
AST: 24 U/L (ref 15–41)
Albumin: 2.7 g/dL — ABNORMAL LOW (ref 3.5–5.0)
Alkaline Phosphatase: 186 U/L — ABNORMAL HIGH (ref 38–126)
Anion gap: 14 (ref 5–15)
BUN: 29 mg/dL — ABNORMAL HIGH (ref 8–23)
CO2: 23 mmol/L (ref 22–32)
Calcium: 9.6 mg/dL (ref 8.9–10.3)
Chloride: 99 mmol/L (ref 98–111)
Creatinine, Ser: 1.81 mg/dL — ABNORMAL HIGH (ref 0.61–1.24)
GFR, Estimated: 37 mL/min — ABNORMAL LOW (ref >60)
Glucose, Bld: 114 mg/dL — ABNORMAL HIGH (ref 70–99)
Potassium: 4.7 mmol/L (ref 3.5–5.1)
Sodium: 136 mmol/L (ref 135–145)
Total Bilirubin: 0.8 mg/dL (ref 0.0–1.2)
Total Protein: 7.7 g/dL (ref 6.5–8.1)

## 2024-07-12 MED ORDER — ONDANSETRON HCL 4 MG/2ML IJ SOLN
4.0000 mg | Freq: Once | INTRAMUSCULAR | Status: AC
  Administered 2024-07-12: 4 mg via INTRAVENOUS
  Filled 2024-07-12: qty 2

## 2024-07-12 MED ORDER — SODIUM CHLORIDE 0.9 % IV BOLUS
1000.0000 mL | Freq: Once | INTRAVENOUS | Status: AC
  Administered 2024-07-12: 1000 mL via INTRAVENOUS

## 2024-07-12 MED ORDER — ACETAMINOPHEN 325 MG PO TABS
650.0000 mg | ORAL_TABLET | Freq: Once | ORAL | Status: AC
  Administered 2024-07-12: 650 mg via ORAL
  Filled 2024-07-12: qty 2

## 2024-07-12 MED ORDER — PIPERACILLIN-TAZOBACTAM 3.375 G IVPB 30 MIN
3.3750 g | Freq: Once | INTRAVENOUS | Status: AC
  Administered 2024-07-12: 3.375 g via INTRAVENOUS
  Filled 2024-07-12 (×2): qty 50

## 2024-07-12 MED ORDER — IOHEXOL 9 MG/ML PO SOLN
500.0000 mL | ORAL | Status: AC
  Administered 2024-07-12: 500 mL via ORAL

## 2024-07-12 MED ORDER — ONDANSETRON 4 MG PO TBDP: 4.0000 mg | ORAL_TABLET | Freq: Three times a day (TID) | ORAL | 1 refills | Status: AC | PRN

## 2024-07-12 MED ORDER — IOHEXOL 300 MG/ML  SOLN
80.0000 mL | Freq: Once | INTRAMUSCULAR | Status: AC | PRN
  Administered 2024-07-12: 80 mL via INTRAVENOUS

## 2024-07-12 NOTE — ED Triage Notes (Signed)
 Pt to ED for abdominal swelling since 3pm today. Abdomen is distended and taut. Wife states husband has been complaining of nausea and not eating much since about 1 week. No dysuria.  Had abdominal surgery (mesh removal, GB removed, and 6 of small intestine removed) in early October.  Hx SBO X2.

## 2024-07-12 NOTE — ED Provider Notes (Signed)
 [facility] Provider Note    Event Date/Time   First MD Initiated Contact with Patient 07/12/24 1935     (approximate)   History   Abdominal Pain   HPI  Stephen Cervantes is a 83 year old male with history of bowel obstruction, recent abdominal surgery presenting to the emergency department for evaluation of abdominal swelling.  Patient underwent ex lap for infected mesh with recurrent ventral hernia and cholelithiasis on 06/07/2024 with Dr. Desiderio.  Was discharged on 10/10.  Reports that he overall had been doing well until about a week ago when he began to have some decreased p.o. intake.  Reports nausea without vomiting.  Having bowel movements.  No dysuria.  Around 3 PM today noticed he had significant abdominal distention leading him to present to the ER.     Physical Exam   Triage Vital Signs: ED Triage Vitals  Encounter Vitals Group     BP 07/12/24 1616 112/64     Girls Systolic BP Percentile --      Girls Diastolic BP Percentile --      Boys Systolic BP Percentile --      Boys Diastolic BP Percentile --      Pulse Rate 07/12/24 1616 95     Resp 07/12/24 1616 18     Temp 07/12/24 1616 98.3 F (36.8 C)     Temp Source 07/12/24 1616 Oral     SpO2 07/12/24 1616 99 %     Weight 07/12/24 1624 174 lb 2.6 oz (79 kg)     Height 07/12/24 1624 5' 9 (1.753 m)     Head Circumference --      Peak Flow --      Pain Score 07/12/24 0210 2     Pain Loc --      Pain Education --      Exclude from Growth Chart --     Most recent vital signs: Vitals:   07/12/24 1958 07/12/24 1959  BP: 119/84   Pulse: 100   Resp: 16   Temp:  98.4 F (36.9 C)  SpO2: 100%      General: Awake, interactive  CV:  Good peripheral perfusion Resp:  Unlabored respirations Abd:  Distended abdomen with mild generalized tenderness.  Midline dressing in place, clean, no surrounding erythema or drainage. Neuro:  Symmetric facial movement, fluid speech   ED Results /  Procedures / Treatments   Labs (all labs ordered are listed, but only abnormal results are displayed) Labs Reviewed  COMPREHENSIVE METABOLIC PANEL WITH GFR - Abnormal; Notable for the following components:      Result Value   Glucose, Bld 114 (*)    BUN 29 (*)    Creatinine, Ser 1.81 (*)    Albumin 2.7 (*)    Alkaline Phosphatase 186 (*)    GFR, Estimated 37 (*)    All other components within normal limits  CBC - Abnormal; Notable for the following components:   RBC 3.21 (*)    Hemoglobin 8.3 (*)    HCT 26.6 (*)    MCH 25.9 (*)    RDW 16.7 (*)    All other components within normal limits  URINALYSIS, ROUTINE W REFLEX MICROSCOPIC - Abnormal; Notable for the following components:   Color, Urine YELLOW (*)    APPearance HAZY (*)    Protein, ur 30 (*)    Leukocytes,Ua MODERATE (*)    Bacteria, UA RARE (*)    All other components within normal  limits  URINE CULTURE  LIPASE, BLOOD     EKG EKG independently reviewed and interpreted by myself demonstrates:    RADIOLOGY Imaging independently reviewed and interpreted by myself demonstrates:  CT abdomen pelvis demonstrates large fluid collection along the abdominal wall with consideration for possible fistula though no direct connection seen   Formal Radiology Read:  CT ABDOMEN PELVIS W CONTRAST Result Date: 07/12/2024 EXAM: CT ABDOMEN AND PELVIS WITH CONTRAST 07/12/2024 09:50:45 PM TECHNIQUE: CT of the abdomen and pelvis was performed with the administration of intravenous contrast. Multiplanar reformatted images are provided for review. Automated exposure control, iterative reconstruction, and/or weight-based adjustment of the mA/kV was utilized to reduce the radiation dose to as low as reasonably achievable. COMPARISON: CT 06/05/2024 CLINICAL HISTORY: Bowel obstruction suspected. FINDINGS: LOWER CHEST: Bibasilar atelectasis/scarring. No active pulmonary identified. LIVER: The liver is unremarkable. GALLBLADDER AND BILE DUCTS:  Cholecystectomy. No biliary ductal dilatation. SPLEEN: No acute abnormality. PANCREAS: No acute abnormality. ADRENAL GLANDS: No acute abnormality. KIDNEYS, URETERS AND BLADDER: Nonobstructing bilateral nephrolithiasis. No hydronephrosis. No perinephric or periureteral stranding. Urinary bladder is unremarkable. GI AND BOWEL: Stomach demonstrates no acute abnormality. Postoperative change of small bowel resection with anastomosis in the left lower quadrant. Postoperative change of colectomy with ileal rectal anastomosis. There is no bowel obstruction. PERITONEUM AND RETROPERITONEUM: Postoperative change of ventral abdominal hernia repair. VASCULATURE: Aorta is normal in caliber. LYMPH NODES: No lymphadenopathy. REPRODUCTIVE ORGANS: No acute abnormality. BONES AND SOFT TISSUES: Posterior fusion in the lumbar spine. Right hip arthroplasty. Massive gas and fluid collection in the central abdominal wall measuring 23.6 x 10.0 x 26.9 cm (ML by AP by CC). This is likely postoperative related to the abdominal wall defect repair on 06/07/2024. Sterility is not determined by imaging. There is mild adjacent stranding in the central abdominal wall musculature and subcutaneous fat. Given the gas within the collection and previous concern for subtle increased density within the collection due to oral contrast extravasation, a fistulous connection with the abutting small bowel is suspected and not definitively identified. No acute osseous abnormality. IMPRESSION: 1. Massive central abdominal wall fluid collection (23.6 x 10.0 x 26.9 cm), likely related to hernia repair. Given gas within the collection and prior concern for oral contrast extravasation, a fistulous connection with the abutting small bowel is suspected but not clearly identified. CT abdomen and pelvis with oral contrast to evaluate for small bowel fistula. Electronically signed by: Norman Gatlin MD 07/12/2024 10:08 PM EST RP Workstation: [id number]     PROCEDURES:  Critical Care performed: No  Procedures   MEDICATIONS ORDERED IN ED: Medications  iohexol  (OMNIPAQUE ) 9 MG/ML oral solution 500 mL (500 mLs Oral Contrast Given 07/12/24 2245)  sodium chloride  0.9 % bolus 1,000 mL (0 mLs Intravenous Stopped 07/12/24 2256)  ondansetron  (ZOFRAN ) injection 4 mg (4 mg Intravenous Given 07/12/24 2040)  acetaminophen  (TYLENOL ) tablet 650 mg (650 mg Oral Given 07/12/24 2040)  iohexol  (OMNIPAQUE ) 300 MG/ML solution 80 mL (80 mLs Intravenous Contrast Given 07/12/24 2139)  sodium chloride  0.9 % bolus 1,000 mL (1,000 mLs Intravenous New Bag/Given 07/12/24 2305)  piperacillin-tazobactam (ZOSYN) IVPB 3.375 g (3.375 g Intravenous New Bag/Given 07/12/24 2306)     IMPRESSION / MDM / ASSESSMENT AND PLAN / ED COURSE  I reviewed the triage vital signs and the nursing notes.  Differential diagnosis includes, but is not limited to, small bowel obstruction, intra-abdominal abscess, volvulus, other acute intra-abdominal process  Patient's presentation is most consistent with acute presentation with potential threat to life or bodily  function.  83 year old male presenting with abdominal swelling, nausea in the setting of recent abdominal surgery.  Stable vitals on presentation.  Labs with stable anemia, CMP with AKI.  UA is concerning for infection, though patient denies urinary symptoms.  Normal lipase.  CT ordered demonstrating large abdominal wall fluid collection.  Will discuss with general surgery.  Clinical Course as of 07/12/24 2339  [place] Jul 12, 2024  2240 Reviewed with Dr. Jordis- recommends Zosyn, IV fluids, hospitalist admit, CT with oral contrast to identify fistula vs seroma. Will consult but not current OR plans, may need IR drain if seroma.  [NR]  2250 Hospitalist team consulted for admission [NR]    Clinical Course User Index [NR] Levander Slate, MD     FINAL CLINICAL IMPRESSION(S) / ED DIAGNOSES   Final diagnoses:  Abdominal wall seroma, initial  encounter     Rx / DC Orders   ED Discharge Orders     None        Note:  This document was prepared using Dragon voice recognition software and may include unintentional dictation errors.   Levander Slate, MD 07/13/24 [phone]

## 2024-07-13 ENCOUNTER — Inpatient Hospital Stay

## 2024-07-13 ENCOUNTER — Observation Stay

## 2024-07-13 DIAGNOSIS — Z9049 Acquired absence of other specified parts of digestive tract: Secondary | ICD-10-CM | POA: Diagnosis not present

## 2024-07-13 DIAGNOSIS — N179 Acute kidney failure, unspecified: Secondary | ICD-10-CM | POA: Diagnosis present

## 2024-07-13 DIAGNOSIS — D649 Anemia, unspecified: Secondary | ICD-10-CM | POA: Diagnosis present

## 2024-07-13 DIAGNOSIS — R197 Diarrhea, unspecified: Secondary | ICD-10-CM | POA: Diagnosis not present

## 2024-07-13 DIAGNOSIS — Z885 Allergy status to narcotic agent status: Secondary | ICD-10-CM | POA: Diagnosis not present

## 2024-07-13 DIAGNOSIS — Z91048 Other nonmedicinal substance allergy status: Secondary | ICD-10-CM | POA: Diagnosis not present

## 2024-07-13 DIAGNOSIS — Z981 Arthrodesis status: Secondary | ICD-10-CM | POA: Diagnosis not present

## 2024-07-13 DIAGNOSIS — L7634 Postprocedural seroma of skin and subcutaneous tissue following other procedure: Secondary | ICD-10-CM | POA: Diagnosis present

## 2024-07-13 DIAGNOSIS — E039 Hypothyroidism, unspecified: Secondary | ICD-10-CM | POA: Diagnosis present

## 2024-07-13 DIAGNOSIS — E785 Hyperlipidemia, unspecified: Secondary | ICD-10-CM | POA: Diagnosis present

## 2024-07-13 DIAGNOSIS — G8918 Other acute postprocedural pain: Secondary | ICD-10-CM | POA: Diagnosis present

## 2024-07-13 DIAGNOSIS — Y832 Surgical operation with anastomosis, bypass or graft as the cause of abnormal reaction of the patient, or of later complication, without mention of misadventure at the time of the procedure: Secondary | ICD-10-CM | POA: Diagnosis present

## 2024-07-13 DIAGNOSIS — Z79890 Hormone replacement therapy (postmenopausal): Secondary | ICD-10-CM | POA: Diagnosis not present

## 2024-07-13 DIAGNOSIS — L02211 Cutaneous abscess of abdominal wall: Secondary | ICD-10-CM | POA: Diagnosis not present

## 2024-07-13 DIAGNOSIS — N400 Enlarged prostate without lower urinary tract symptoms: Secondary | ICD-10-CM | POA: Diagnosis present

## 2024-07-13 DIAGNOSIS — S3011XA Contusion of abdominal wall, initial encounter: Secondary | ICD-10-CM

## 2024-07-13 DIAGNOSIS — Z87891 Personal history of nicotine dependence: Secondary | ICD-10-CM | POA: Diagnosis not present

## 2024-07-13 DIAGNOSIS — Z87442 Personal history of urinary calculi: Secondary | ICD-10-CM | POA: Diagnosis not present

## 2024-07-13 DIAGNOSIS — I10 Essential (primary) hypertension: Secondary | ICD-10-CM | POA: Diagnosis present

## 2024-07-13 DIAGNOSIS — Z79899 Other long term (current) drug therapy: Secondary | ICD-10-CM | POA: Diagnosis not present

## 2024-07-13 LAB — PROTIME-INR
INR: 1.4 — ABNORMAL HIGH (ref 0.8–1.2)
Prothrombin Time: 17.5 s — ABNORMAL HIGH (ref 11.4–15.2)

## 2024-07-13 MED ORDER — ENSURE PLUS HIGH PROTEIN PO LIQD
237.0000 mL | Freq: Two times a day (BID) | ORAL | Status: DC
  Administered 2024-07-14 – 2024-07-16 (×4): 237 mL via ORAL

## 2024-07-13 MED ORDER — ONDANSETRON HCL 4 MG/2ML IJ SOLN: 4.0000 mg | Freq: Four times a day (QID) | INTRAMUSCULAR | Status: DC | PRN

## 2024-07-13 MED ORDER — HYDROCODONE-ACETAMINOPHEN 5-325 MG PO TABS
1.0000 | ORAL_TABLET | ORAL | Status: DC | PRN
  Administered 2024-07-15: 2 via ORAL
  Filled 2024-07-13: qty 2

## 2024-07-13 MED ORDER — LOSARTAN POTASSIUM 50 MG PO TABS
50.0000 mg | ORAL_TABLET | Freq: Every day | ORAL | Status: DC
  Administered 2024-07-13 – 2024-07-16 (×4): 50 mg via ORAL
  Filled 2024-07-13 (×4): qty 1

## 2024-07-13 MED ORDER — FENTANYL CITRATE (PF) 100 MCG/2ML IJ SOLN
INTRAMUSCULAR | Status: AC
  Filled 2024-07-13: qty 2

## 2024-07-13 MED ORDER — ONDANSETRON HCL 4 MG PO TABS: 4.0000 mg | ORAL_TABLET | Freq: Four times a day (QID) | ORAL | Status: DC | PRN

## 2024-07-13 MED ORDER — TAMSULOSIN HCL 0.4 MG PO CAPS
0.4000 mg | ORAL_CAPSULE | Freq: Every day | ORAL | Status: DC
  Administered 2024-07-13 – 2024-07-16 (×4): 0.4 mg via ORAL
  Filled 2024-07-13 (×4): qty 1

## 2024-07-13 MED ORDER — SODIUM CHLORIDE 0.9 % IV SOLN: INTRAVENOUS | Status: AC | PRN

## 2024-07-13 MED ORDER — FINASTERIDE 5 MG PO TABS
5.0000 mg | ORAL_TABLET | Freq: Every day | ORAL | Status: DC
  Administered 2024-07-13 – 2024-07-16 (×4): 5 mg via ORAL
  Filled 2024-07-13 (×4): qty 1

## 2024-07-13 MED ORDER — MIDAZOLAM HCL (PF) 2 MG/2ML IJ SOLN
INTRAMUSCULAR | Status: AC | PRN
Start: 2024-07-13 — End: 2024-07-13
  Administered 2024-07-13: 2 mg via INTRAVENOUS

## 2024-07-13 MED ORDER — MIDAZOLAM HCL 2 MG/2ML IJ SOLN
INTRAMUSCULAR | Status: AC
Start: 2024-07-13 — End: 2024-07-13
  Filled 2024-07-13: qty 2

## 2024-07-13 MED ORDER — SODIUM CHLORIDE 0.9% FLUSH
5.0000 mL | Freq: Three times a day (TID) | INTRAVENOUS | Status: DC
  Administered 2024-07-13 – 2024-07-16 (×9): 5 mL

## 2024-07-13 MED ORDER — ATORVASTATIN CALCIUM 10 MG PO TABS
10.0000 mg | ORAL_TABLET | Freq: Every day | ORAL | Status: DC
  Administered 2024-07-13 – 2024-07-16 (×4): 10 mg via ORAL
  Filled 2024-07-13 (×4): qty 1

## 2024-07-13 MED ORDER — PIPERACILLIN-TAZOBACTAM 3.375 G IVPB
3.3750 g | Freq: Three times a day (TID) | INTRAVENOUS | Status: DC
  Administered 2024-07-13 – 2024-07-16 (×10): 3.375 g via INTRAVENOUS
  Filled 2024-07-13 (×11): qty 50

## 2024-07-13 MED ORDER — ACETAMINOPHEN 325 MG PO TABS
650.0000 mg | ORAL_TABLET | Freq: Four times a day (QID) | ORAL | Status: DC | PRN
  Administered 2024-07-13 – 2024-07-16 (×8): 650 mg via ORAL
  Filled 2024-07-13 (×8): qty 2

## 2024-07-13 MED ORDER — LEVOTHYROXINE SODIUM 50 MCG PO TABS
125.0000 ug | ORAL_TABLET | Freq: Every day | ORAL | Status: DC
Start: 2024-07-13 — End: 2024-07-16
  Administered 2024-07-13 – 2024-07-16 (×4): 125 ug via ORAL
  Filled 2024-07-13 (×4): qty 1

## 2024-07-13 MED ORDER — SODIUM CHLORIDE 0.9 % IV SOLN: INTRAVENOUS | Status: AC

## 2024-07-13 MED ORDER — MORPHINE SULFATE (PF) 2 MG/ML IV SOLN: 2.0000 mg | INTRAVENOUS | Status: DC | PRN

## 2024-07-13 MED ORDER — ACETAMINOPHEN 650 MG RE SUPP
650.0000 mg | Freq: Four times a day (QID) | RECTAL | Status: DC | PRN
Start: 2024-07-13 — End: 2024-07-16

## 2024-07-13 NOTE — Plan of Care (Signed)
   Problem: Health Behavior/Discharge Planning: Goal: Ability to manage health-related needs will improve Outcome: Progressing   Problem: Clinical Measurements: Goal: Ability to maintain clinical measurements within normal limits will improve Outcome: Progressing

## 2024-07-13 NOTE — Progress Notes (Signed)
 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 Will have drained placed by IR as per gen surg. Continue on IV zosyn. No evidence of contrast extravasation to suggest fistula. Gen surg following and recs apprec   HLD: continue on statin    Hypothyroidism: continue on levothyroxine     HTN: continue on home dose of losartan    Hx of duodenal ulcer: s/p EGD on 05/30/24 which did not show any ulcers.   BPH: continue on home dose of finasteride , tamsulosin    Normocytic anemia: no need for a transfusion currently. Will continue to monitor        DVT prophylaxis: SCDs Code Status: full  Family Communication:  Disposition Plan: depends on PT/OT recs (not consulted yet)   Level of care: Med-Surg Consultants:  Gen surg IR  Procedures:   Antimicrobials: zosyn   Subjective: Pt c/o abd pain   Objective: Vitals:   07/13/24 0100 07/13/24 0117 07/13/24 0516 07/13/24 0820  BP: 131/66 127/63 (!) 104/57 102/64  Pulse: 98 93 76 76  Resp: 20 20 18 17   Temp: 98.2 F (36.8 C) 98.2 F (36.8 C) 98.6 F (37 C) 97.8 F (36.6 C)  TempSrc: Oral Oral Oral   SpO2: 98% 95% 98% 96%  Weight:  81.3 kg    Height:  5' 9 (1.753 m)      Intake/Output Summary (Last 24 hours) at 07/13/2024 0924 Last data filed at 07/13/2024 0031 Gross per 24 hour  Intake 2050 ml  Output --  Net 2050 ml   Filed Weights   07/12/24 1624 07/13/24 0117  Weight: 79 kg 81.3 kg     Examination:  General exam: Appears calm and comfortable  Respiratory system: Clear to auscultation. Respiratory effort normal. Cardiovascular system: S1 & S2+. No  rubs, gallops or clicks.  Gastrointestinal system: Abdomen is distended, soft and nontender. Normal bowel sounds heard. Central nervous system: Alert and oriented.Moves all extremities Psychiatry: Judgement and insight appears at baseline. Flat mood and affect    Data Reviewed: I have personally reviewed following labs and imaging studies  CBC: Recent Labs  Lab 07/12/24 1626  WBC 8.6  HGB 8.3*  HCT 26.6*  MCV 82.9  PLT 334   Basic Metabolic Panel: Recent Labs  Lab 07/12/24 1626  NA 136  K 4.7  CL 99  CO2 23  GLUCOSE 114*  BUN 29*  CREATININE 1.81*  CALCIUM  9.6   GFR: Estimated Creatinine Clearance: 30.9 mL/min (A) (by C-G formula based on SCr of 1.81 mg/dL (H)). Liver Function Tests: Recent Labs  Lab 07/12/24 1626  AST 24  ALT 13  ALKPHOS 186*  BILITOT 0.8  PROT 7.7  ALBUMIN 2.7*   Recent Labs  Lab 07/12/24 1626  LIPASE 26   No results for input(s): AMMONIA in the last 168 hours. Coagulation Profile: Recent Labs  Lab 07/13/24 0839  INR 1.4*   Cardiac Enzymes: No results for input(s): CKTOTAL, CKMB, CKMBINDEX, TROPONINI in the last 168 hours. BNP (last 3 results) No results  for input(s): PROBNP in the last 8760 hours. HbA1C: No results for input(s): HGBA1C in the last 72 hours. CBG: No results for input(s): GLUCAP in the last 168 hours. Lipid Profile: No results for input(s): CHOL, HDL, LDLCALC, TRIG, CHOLHDL, LDLDIRECT in the last 72 hours. Thyroid Function Tests: No results for input(s): TSH, T4TOTAL, FREET4, T3FREE, THYROIDAB in the last 72 hours. Anemia Panel: No results for input(s): VITAMINB12, FOLATE, FERRITIN, TIBC, IRON, RETICCTPCT in the last 72 hours. Sepsis Labs: No results for input(s): PROCALCITON,  LATICACIDVEN in the last 168 hours.  No results found for this or any previous visit (from the past 240 hours).       Radiology Studies: CT ABDOMEN PELVIS WO CONTRAST Result Date: 07/13/2024 EXAM: CT ABDOMEN AND PELVIS WITHOUT CONTRAST 07/13/2024 01:52:43 AM TECHNIQUE: CT of the abdomen and pelvis was performed without the administration of intravenous contrast. Multiplanar reformatted images are provided for review. Automated exposure control, iterative reconstruction, and/or weight-based adjustment of the mA/kV was utilized to reduce the radiation dose to as low as reasonably achievable. COMPARISON: CT abdomen and pelvis 07/12/2024. CLINICAL HISTORY: eval seroma vs fistula eval seroma vs fistula FINDINGS: LOWER CHEST: No acute abnormality. LIVER: The liver is unremarkable. GALLBLADDER AND BILE DUCTS: Cholecystectomy. No biliary ductal dilatation. SPLEEN: No acute abnormality. PANCREAS: No acute abnormality. ADRENAL GLANDS: No acute abnormality. KIDNEYS, URETERS AND BLADDER: No stones in the kidneys or ureters. No hydronephrosis. No perinephric or periureteral stranding. Urinary bladder is unremarkable. GI AND BOWEL: Stomach demonstrates no acute abnormality. There is no bowel obstruction. No evidence of extraluminal oral contrast within the fluid and gas collection. Postoperative change of colectomy with ileorectal anastomosis. Small bowel anastomosis in the left lower quadrant. PERITONEUM AND RETROPERITONEUM: No ascites. No free air. VASCULATURE: Aorta is normal in caliber. LYMPH NODES: No lymphadenopathy. REPRODUCTIVE ORGANS: No acute abnormality. BONES AND SOFT TISSUES: Posterior lumbar fusion. Right hip arthroplasty. Recent postoperative change of ventral abdominal hernia repair. The large fluid and gas collection in the anterior abdominal wall is unchanged. IMPRESSION: 1. Large fluid and gas collection in the anterior abdominal wall. No evidence of extraluminal oral contrast within the collection.  Electronically signed by: Norman Gatlin MD 07/13/2024 02:00 AM EST RP Workstation: [id number]   CT ABDOMEN PELVIS W CONTRAST Result Date: 07/12/2024 EXAM: CT ABDOMEN AND PELVIS WITH CONTRAST 07/12/2024 09:50:45 PM TECHNIQUE: CT of the abdomen and pelvis was performed with the administration of intravenous contrast. Multiplanar reformatted images are provided for review. Automated exposure control, iterative reconstruction, and/or weight-based adjustment of the mA/kV was utilized to reduce the radiation dose to as low as reasonably achievable. COMPARISON: CT 06/05/2024 CLINICAL HISTORY: Bowel obstruction suspected. FINDINGS: LOWER CHEST: Bibasilar atelectasis/scarring. No active pulmonary identified. LIVER: The liver is unremarkable. GALLBLADDER AND BILE DUCTS: Cholecystectomy. No biliary ductal dilatation. SPLEEN: No acute abnormality. PANCREAS: No acute abnormality. ADRENAL GLANDS: No acute abnormality. KIDNEYS, URETERS AND BLADDER: Nonobstructing bilateral nephrolithiasis. No hydronephrosis. No perinephric or periureteral stranding. Urinary bladder is unremarkable. GI AND BOWEL: Stomach demonstrates no acute abnormality. Postoperative change of small bowel resection with anastomosis in the left lower quadrant. Postoperative change of colectomy with ileal rectal anastomosis. There is no bowel obstruction. PERITONEUM AND RETROPERITONEUM: Postoperative change of ventral abdominal hernia repair. VASCULATURE: Aorta is normal in caliber. LYMPH NODES: No lymphadenopathy. REPRODUCTIVE ORGANS: No acute abnormality. BONES AND SOFT TISSUES: Posterior fusion in the lumbar spine. Right hip arthroplasty. Massive gas and fluid collection in the central abdominal wall measuring 23.6 x 10.0 x 26.9 cm (ML  by AP by CC). This is likely postoperative related to the abdominal wall defect repair on 06/07/2024. Sterility is not determined by imaging. There is mild adjacent stranding in the central abdominal wall musculature and  subcutaneous fat. Given the gas within the collection and previous concern for subtle increased density within the collection due to oral contrast extravasation, a fistulous connection with the abutting small bowel is suspected and not definitively identified. No acute osseous abnormality. IMPRESSION: 1. Massive central abdominal wall fluid collection (23.6 x 10.0 x 26.9 cm), likely related to hernia repair. Given gas within the collection and prior concern for oral contrast extravasation, a fistulous connection with the abutting small bowel is suspected but not clearly identified. CT abdomen and pelvis with oral contrast to evaluate for small bowel fistula. Electronically signed by: Norman Gatlin MD 07/12/2024 10:08 PM EST RP Workstation: [id number]        Scheduled Meds:  atorvastatin   10 mg Oral Daily   finasteride   5 mg Oral Daily   levothyroxine   125 mcg Oral Q0600   losartan  50 mg Oral Daily   tamsulosin   0.4 mg Oral Daily   Continuous Infusions:  sodium chloride  10 mL/hr at 07/13/24 0541   sodium chloride  100 mL/hr at 07/13/24 0837   piperacillin-tazobactam (ZOSYN)  IV 3.375 g (07/13/24 0543)     LOS: 0 days      Stephen CHRISTELLA Pouch, MD [facility] Pager [phone]  If 7PM-7AM, please contact night-coverage  07/13/2024, 9:24 AM

## 2024-07-13 NOTE — Consult Note (Signed)
 Patient ID: Stephen Cervantes, male   DOB: 10-30-1940, 83 y.o.   MRN: [id number]  HPI Stephen Cervantes is a 83 y.o. 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  He needed a quite extensive surgical to intervention including an abdominal wall reconstruction with excision of mesh and his bowel and bowel resection and repair of hernia.  He also had a repair of the bladder.  He did well up until a week ago and started having some nausea and decreased appetite.  He also noticed significant distention of his abdominal wall.  He has persistent significant discomfort with some pain that is mild to moderate and intermittent mid abdomen.  No specific alleviating or aggravating factors.  He did have a CT scan showing a massive seroma formation cannot exclude infection.  Yesterday initial CT was done without p.o. contrast and a repeat CT with p.o. contrast I have also personally reviewed showing no evidence of contrast extravasation.  No evidence of fistula.  I have reviewed his prior follow-ups with Dr. Desiderio and the last drain removal was done on 3 weeks ago or so.  He denies any fevers or chills.  Having bowel movements Case and imaging reviewed with Dr. VEAR Lent.     HPI  Past Medical History:  Diagnosis Date   Arthritis    Asthma    Blood transfusion without reported diagnosis    Bowel obstruction (HCC) 7988,7984   Depression    Diverticulitis 2006   Enlarged prostate    Gallstones 07/04/2014   History of gastrointestinal ulcer    History of kidney stones    HLD (hyperlipidemia)    HTN (hypertension)    Kidney stone    kidney stones   Neuritis or radiculitis due to rupture of lumbar intervertebral disc 05/29/2015   Thyroid disease     Past Surgical History:   Procedure Laterality Date   ABDOMINAL WALL DEFECT REPAIR N/A 06/07/2024   Procedure: REPAIR, ABDOMINAL WALL;  Surgeon: Desiderio Schanz, MD;  Location: [facility];  Service: General;  Laterality: N/A;   APPENDECTOMY     BOWEL RESECTION  06/07/2024   Procedure: EXCISION, SMALL INTESTINE;  Surgeon: Desiderio Schanz, MD;  Location: [facility];  Service: General;;   CHOLECYSTECTOMY N/A 06/07/2024   Procedure: CHOLECYSTECTOMY;  Surgeon: Desiderio Schanz, MD;  Location: [facility];  Service: General;  Laterality: N/A;   COLECTOMY  09/05/2004   Abdominal colectomy, ileostomy, Hartman procedure for perforation of cecum, sigmoid diverticulitis   ESOPHAGOGASTRODUODENOSCOPY N/A 05/30/2024   Procedure: EGD (ESOPHAGOGASTRODUODENOSCOPY);  Surgeon: [name], MD;  Location: [facility];  Service: Gastroenterology;  Laterality: N/A;   EXCISION OF MESH N/A 06/07/2024   Procedure: REMOVAL, MESH, ABDOMEN OR PELVIS;  Surgeon: Desiderio Schanz, MD;  Location: [facility];  Service: General;  Laterality: N/A;   HERNIA REPAIR  2008?   [facility]   ILEOSTOMY  09/05/2005   JOINT REPLACEMENT  09/05/2012   Hip Replacement [name]   KNEE SURGERY Right 09/05/2009   POSTERIOR LUMBAR FUSION 4 LEVEL N/A 03/13/2019   Procedure: POSTERIOR LUMBAR FUSION 4 LEVEL L1-5;  Surgeon: Clois Fret, MD;  Location: [facility];  Service: Neurosurgery;  Laterality: N/A;   TRIGGER FINGER RELEASE Right 05/05/2021   Procedure: RELEASE TRIGGER FINGER/A-1 PULLEY;  Surgeon: Edie,  Norleen PARAS, MD;  Location: [facility];  Service: Orthopedics;  Laterality: Right;    Family History  Problem Relation Age of Onset   Cancer Father        cancer of lung, brain  and lymphoma   Parkinson's disease Brother    Mesothelioma Brother    Kidney disease Neg Hx    Prostate cancer Neg Hx    Kidney cancer Neg Hx    Bladder Cancer Neg Hx     Social History Social History   Tobacco Use   Smoking status: Former    Current  packs/day: 0.00    Types: Cigarettes    Quit date: 1998    Years since quitting: 27.8    Passive exposure: Past   Smokeless tobacco: Never  Vaping Use   Vaping status: Never Used  Substance Use Topics   Alcohol use: No    Alcohol/week: 0.0 standard drinks of alcohol   Drug use: No    Allergies  Allergen Reactions   Morphine And Codeine Other (See Comments)    Severe hallucinations   Tape Other (See Comments)    Silk Tape per patient peels off my skin    Current Facility-Administered Medications  Medication Dose Route Frequency Provider Last Rate Last Admin   0.9 %  sodium chloride  infusion   Intravenous PRN Duncan, Hazel V, MD 10 mL/hr at 07/13/24 0541 New Bag at 07/13/24 0541   0.9 %  sodium chloride  infusion   Intravenous Continuous Lacreshia Bondarenko F, MD 100 mL/hr at 07/13/24 0837 New Bag at 07/13/24 0837   acetaminophen  (TYLENOL ) tablet 650 mg  650 mg Oral Q6H PRN Duncan, Hazel V, MD   650 mg at 07/13/24 0846   Or   acetaminophen  (TYLENOL ) suppository 650 mg  650 mg Rectal Q6H PRN Cleatus Delayne GAILS, MD       atorvastatin  (LIPITOR) tablet 10 mg  10 mg Oral Daily Duncan, Hazel V, MD   10 mg at 07/13/24 0830   finasteride  (PROSCAR ) tablet 5 mg  5 mg Oral Daily Duncan, Hazel V, MD   5 mg at 07/13/24 0831   HYDROcodone-acetaminophen  (NORCO/VICODIN) 5-325 MG per tablet 1-2 tablet  1-2 tablet Oral Q4H PRN Duncan, Hazel V, MD       levothyroxine  (SYNTHROID ) tablet 125 mcg  125 mcg Oral Q0600 Cleatus Delayne GAILS, MD   125 mcg at 07/13/24 0544   losartan (COZAAR) tablet 50 mg  50 mg Oral Daily Duncan, Hazel V, MD   50 mg at 07/13/24 0831   morphine (PF) 2 MG/ML injection 2 mg  2 mg Intravenous Q2H PRN Duncan, Hazel V, MD       ondansetron  (ZOFRAN ) tablet 4 mg  4 mg Oral Q6H PRN Cleatus Delayne GAILS, MD       Or   ondansetron  (ZOFRAN ) injection 4 mg  4 mg Intravenous Q6H PRN Duncan, Hazel V, MD       piperacillin-tazobactam (ZOSYN) IVPB 3.375 g  3.375 g Intravenous Q8H Duncan, Hazel V, MD 12.5  mL/hr at 07/13/24 0543 3.375 g at 07/13/24 0543   tamsulosin  (FLOMAX ) capsule 0.4 mg  0.4 mg Oral Daily Cleatus Delayne GAILS, MD   0.4 mg at 07/13/24 0830     Review of Systems Full ROS  was asked and was negative except for the information on the HPI  Physical Exam Blood pressure 102/64, pulse 76, temperature 97.8 F (36.6 C), resp. rate 17, height 5' 9 (1.753 m), weight 81.3 kg, SpO2 96%.  CONSTITUTIONAL: NAD. EYES: Pupils are equal, round, Sclera are non-icteric. EARS, NOSE, MOUTH AND THROAT: The oropharynx is clear. The oral mucosa is pink and moist. Hearing is intact to voice. LYMPH NODES:  Lymph nodes in the neck are normal. RESPIRATORY:  Lungs are clear. There is normal respiratory effort, with equal breath sounds bilaterally, and without pathologic use of accessory muscles. CARDIOVASCULAR: Heart is regular without murmurs, gallops, or rubs. GI: The abdomen is  soft, there is distention noticed of anterior abdominal wall related to massive seroma.  The wound is healing well with some areas of scabbing.  No major opening areas no evidence of a wound infection.  No peritonitis . There is no hepatosplenomegaly. There are normal bowel sounds  GU: Rectal deferred.   MUSCULOSKELETAL: Normal muscle strength and tone. No cyanosis or edema.   SKIN: Turgor is good and there are no pathologic skin lesions or ulcers. NEUROLOGIC: Motor and sensation is grossly normal. Cranial nerves are grossly intact. PSYCH:  Oriented to person, place and time. Affect is normal.  Data Reviewed I have personally reviewed the patient's imaging, laboratory findings and medical records.    Assessment/Plan 83 year old 5 weeks out from major abdominal wall reconstruction with small bowel resection and mass excision for mesh infection and erosion into the small bowel.  Now presents with a symptomatic large abdominal wall seroma.  There is no evidence of contrast extravasation to suggest fistula.  Patient is not  peritonitic nor toxic.  Definitely recommend drain placement I have discussed with IR in detail and they will be able to do it today.  In the meantime I will suggest also continuation of antibiotic and serial exams.  No need for urgent surgical intervention at this time.  Plan discussed in detail with patient and wife.  All her questions were answered to her satisfaction I personally spent a total of 75 minutes in the care of the patient today including performing a medically appropriate exam/evaluation, counseling and educating, placing orders, referring and communicating with other health care professionals, documenting clinical information in the EHR, independently interpreting and reviewing images studies and coordinating care.    Stephen Luna, MD FACS General Surgeon 07/13/2024, 10:40 AM

## 2024-07-13 NOTE — Consult Note (Signed)
 Chief Complaint:  Abdominal Wall Fluid Collection   Procedure: Percutaneous Drain Placement  Referring Provider(s): Dr. Laneta Luna  Supervising Physician: Karalee Beat  Patient Status: [facility] - In-pt  History of Present Illness: Stephen Cervantes is a 83 y.o. male with a history of bowel obstruction, prior subtotal colectomy with end ileostomy and subsequent reversal, open incisional ventral hernia repair with mesh and recent ex-lap with excision of infected mesh, debridement of midline skin wound/abscess cavity measuring 15 x 3cm, small bowel resection, open cholecystectomy, bladder repair, and recurrent ventral hernia repair on 10/3. Patient was discharged home on 10/10 with outpatient antibiotics and follow up with Surgery on 10/15 where they removed his drains and Prevena VAC. He was seen by Surgery again on 10/31 at which time he reported no issues and good healing of his midline wound. Unfortunately, patient presented to the ED on 11/7 with complaints of decreased p.o. intake, and nausea without vomiting for the previous week. He also noted significant abdominal distention in the past 24hrs. ED workup significant for WBC 8.6, BUN/Cr of 29/1.81, and imaging with a massive central abdominal wall fluid collection likely related to hernia repair. IR consulted for possible drain placement. Case and imaging reviewed and approved by Dr. VEAR Karalee.   Pleasant gentleman resting in bed with family at the bedside. States that he was doing well until about a week ago when he started to have early satiety and nausea. States that yesterday he started to notice significant abdominal swelling and decided to present to the ED. He denies any abdominal pain, but states that his abdomen feels tighter than it normally does. He is not on any anticoagulation. NPO since midnight. All questions and concerns answered at the bedside.   Patient is Full Code  Past Medical History:  Diagnosis Date   Arthritis     Asthma    Blood transfusion without reported diagnosis    Bowel obstruction (HCC) 7988,7984   Depression    Diverticulitis 2006   Enlarged prostate    Gallstones 07/04/2014   History of gastrointestinal ulcer    History of kidney stones    HLD (hyperlipidemia)    HTN (hypertension)    Kidney stone    kidney stones   Neuritis or radiculitis due to rupture of lumbar intervertebral disc 05/29/2015   Thyroid disease     Past Surgical History:  Procedure Laterality Date   ABDOMINAL WALL DEFECT REPAIR N/A 06/07/2024   Procedure: REPAIR, ABDOMINAL WALL;  Surgeon: Desiderio Schanz, MD;  Location: [facility] ORS;  Service: General;  Laterality: N/A;   APPENDECTOMY     BOWEL RESECTION  06/07/2024   Procedure: EXCISION, SMALL INTESTINE;  Surgeon: Desiderio Schanz, MD;  Location: [facility] ORS;  Service: General;;   CHOLECYSTECTOMY N/A 06/07/2024   Procedure: CHOLECYSTECTOMY;  Surgeon: Desiderio Schanz, MD;  Location: [facility] ORS;  Service: General;  Laterality: N/A;   COLECTOMY  09/05/2004   Abdominal colectomy, ileostomy, Hartman procedure for perforation of cecum, sigmoid diverticulitis   ESOPHAGOGASTRODUODENOSCOPY N/A 05/30/2024   Procedure: EGD (ESOPHAGOGASTRODUODENOSCOPY);  Surgeon: [name], MD;  Location: [facility] ENDOSCOPY;  Service: Gastroenterology;  Laterality: N/A;   EXCISION OF MESH N/A 06/07/2024   Procedure: REMOVAL, MESH, ABDOMEN OR PELVIS;  Surgeon: Desiderio Schanz, MD;  Location: [facility] ORS;  Service: General;  Laterality: N/A;   HERNIA REPAIR  2008?   [facility]   ILEOSTOMY  09/05/2005   JOINT REPLACEMENT  09/05/2012   Hip Replacement [name]  KNEE SURGERY Right 09/05/2009   POSTERIOR LUMBAR FUSION 4 LEVEL N/A 03/13/2019   Procedure: POSTERIOR LUMBAR FUSION 4 LEVEL L1-5;  Surgeon: Clois Fret, MD;  Location: [facility] ORS;  Service: Neurosurgery;  Laterality: N/A;   TRIGGER FINGER RELEASE Right 05/05/2021   Procedure: RELEASE TRIGGER FINGER/A-1 PULLEY;   Surgeon: Edie Norleen PARAS, MD;  Location: [facility] ORS;  Service: Orthopedics;  Laterality: Right;    Allergies: Morphine and codeine and Tape  Medications: Prior to Admission medications   Medication Sig Start Date End Date Taking? Authorizing Provider  atorvastatin  (LIPITOR) 10 MG tablet Take 10 mg by mouth daily.     [provider]  celecoxib  (CELEBREX ) 200 MG capsule Take 1 capsule (200 mg total) by mouth daily. 03/13/23   Hilma Hastings, PA-C  ferrous sulfate 325 (65 FE) MG tablet Take 325 mg by mouth daily with breakfast.    [provider]  finasteride  (PROSCAR ) 5 MG tablet TAKE ONE TABLET EVERY DAY 01/12/24   McGowan, Clotilda A, PA-C  levothyroxine  (SYNTHROID ) 125 MCG tablet Take 125 mcg by mouth daily. 03/05/24   [provider]  loperamide  (IMODIUM ) 2 MG capsule Take 6 mg by mouth in the morning, at noon, and at bedtime.    [provider]  losartan (COZAAR) 50 MG tablet Take 50 mg by mouth daily. 03/05/24   [provider]  montelukast  (SINGULAIR ) 10 MG tablet Take 1 tablet by mouth daily. 06/16/23   [provider]  Multiple Vitamin (MULTI-VITAMIN) tablet Take 1 tablet by mouth daily.    [provider]  ondansetron  (ZOFRAN -ODT) 4 MG disintegrating tablet Take 1 tablet (4 mg total) by mouth every 8 (eight) hours as needed for nausea or vomiting. 07/12/24   Pabon, Diego F, MD  oxyCODONE  (OXY IR/ROXICODONE ) 5 MG immediate release tablet Take 1-2 tablets (5-10 mg total) by mouth every 6 (six) hours as needed for severe pain (pain score 7-10) or breakthrough pain. 06/14/24   [name], [name], PA-C  potassium citrate  (UROCIT-K ) 10 MEQ (1080 MG) SR tablet Take 10 mEq by mouth 4 (four) times daily. 05/01/24   [provider]  sucralfate  (CARAFATE ) 1 g tablet Take 1 g by mouth in the morning, at noon, and at bedtime.    [provider]  tamsulosin  (FLOMAX ) 0.4 MG CAPS capsule TAKE 1 CAPSULE BY MOUTH ONCE DAILY 01/12/24   McGowan,  Clotilda LABOR, PA-C     Family History  Problem Relation Age of Onset   Cancer Father        cancer of lung, brain  and lymphoma   Parkinson's disease Brother    Mesothelioma Brother    Kidney disease Neg Hx    Prostate cancer Neg Hx    Kidney cancer Neg Hx    Bladder Cancer Neg Hx     Social History   Socioeconomic History   Marital status: Single    Spouse name: Not on file   Number of children: Not on file   Years of education: Not on file   Highest education level: Not on file  Occupational History   Not on file  Tobacco Use   Smoking status: Former    Current packs/day: 0.00    Types: Cigarettes    Quit date: 1998    Years since quitting: 27.8    Passive exposure: Past   Smokeless tobacco: Never  Vaping Use   Vaping status: Never Used  Substance and Sexual Activity   Alcohol use: No  Alcohol/week: 0.0 standard drinks of alcohol   Drug use: No   Sexual activity: Not on file  Other Topics Concern   Not on file  Social History Narrative   Lives with GF [name].          ** Merged History Encounter **    Social Drivers of Health   Financial Resource Strain: Low Risk  (05/15/2024)   Received from [facility]   Overall Financial Resource Strain (CARDIA)    Difficulty of Paying Living Expenses: Not hard at all  Food Insecurity: No Food Insecurity (07/13/2024)   Hunger Vital Sign    Worried About Running Out of Food in the Last Year: Never true    Ran Out of Food in the Last Year: Never true  Transportation Needs: No Transportation Needs (07/13/2024)   PRAPARE - Administrator, Civil Service (Medical): No    Lack of Transportation (Non-Medical): No  Physical Activity: Not on file  Stress: Not on file  Social Connections: Moderately Integrated (07/13/2024)   Social Connection and Isolation Panel    Frequency of Communication with Friends and Family: Twice a week    Frequency of Social Gatherings with Friends and Family: Once a  week    Attends Religious Services: Never    Database Administrator or Organizations: Yes    Attends Engineer, Structural: 1 to 4 times per year    Marital Status: Living with partner  Recent Concern: Social Connections - Moderately Isolated (06/05/2024)   Social Connection and Isolation Panel    Frequency of Communication with Friends and Family: More than three times a week    Frequency of Social Gatherings with Friends and Family: More than three times a week    Attends Religious Services: Never    Database Administrator or Organizations: No    Attends Banker Meetings: Never    Marital Status: Living with partner    Review of Systems  Constitutional:  Positive for appetite change.  Gastrointestinal:  Positive for abdominal distention and nausea. Negative for vomiting.  Patient denies any headache, chest pain, shortness of breath, abdominal pain, vomiting, or fever/chills. All other systems are negative.   Vital Signs: BP (!) 104/57 (BP Location: Left Arm)   Pulse 76   Temp 98.6 F (37 C) (Oral)   Resp 18   Ht 5' 9 (1.753 m)   Wt 179 lb 3.7 oz (81.3 kg)   SpO2 98%   BMI 26.47 kg/m    Physical Exam Vitals reviewed.  Constitutional:      Appearance: Normal appearance.  HENT:     Mouth/Throat:     Mouth: Mucous membranes are moist.     Pharynx: Oropharynx is clear.  Cardiovascular:     Rate and Rhythm: Normal rate and regular rhythm.     Pulses: Normal pulses.     Heart sounds: Normal heart sounds.  Pulmonary:     Effort: Pulmonary effort is normal.     Breath sounds: Normal breath sounds.  Abdominal:     General: There is distension (abdominal distension with increased tension).     Tenderness: There is no abdominal tenderness.  Skin:    General: Skin is warm and dry.  Neurological:     Mental Status: He is alert and oriented to person, place, and time.  Psychiatric:        Behavior: Behavior normal.     Imaging: CT ABDOMEN PELVIS WO  CONTRAST Result Date: 07/13/2024 EXAM: CT ABDOMEN AND PELVIS WITHOUT CONTRAST 07/13/2024 01:52:43 AM TECHNIQUE: CT of the abdomen and pelvis was performed without the administration of intravenous contrast. Multiplanar reformatted images are provided for review. Automated exposure control, iterative reconstruction, and/or weight-based adjustment of the mA/kV was utilized to reduce the radiation dose to as low as reasonably achievable. COMPARISON: CT abdomen and pelvis 07/12/2024. CLINICAL HISTORY: eval seroma vs fistula eval seroma vs fistula FINDINGS: LOWER CHEST: No acute abnormality. LIVER: The liver is unremarkable. GALLBLADDER AND BILE DUCTS: Cholecystectomy. No biliary ductal dilatation. SPLEEN: No acute abnormality. PANCREAS: No acute abnormality. ADRENAL GLANDS: No acute abnormality. KIDNEYS, URETERS AND BLADDER: No stones in the kidneys or ureters. No hydronephrosis. No perinephric or periureteral stranding. Urinary bladder is unremarkable. GI AND BOWEL: Stomach demonstrates no acute abnormality. There is no bowel obstruction. No evidence of extraluminal oral contrast within the fluid and gas collection. Postoperative change of colectomy with ileorectal anastomosis. Small bowel anastomosis in the left lower quadrant. PERITONEUM AND RETROPERITONEUM: No ascites. No free air. VASCULATURE: Aorta is normal in caliber. LYMPH NODES: No lymphadenopathy. REPRODUCTIVE ORGANS: No acute abnormality. BONES AND SOFT TISSUES: Posterior lumbar fusion. Right hip arthroplasty. Recent postoperative change of ventral abdominal hernia repair. The large fluid and gas collection in the anterior abdominal wall is unchanged. IMPRESSION: 1. Large fluid and gas collection in the anterior abdominal wall. No evidence of extraluminal oral contrast within the collection. Electronically signed by: Norman Gatlin MD 07/13/2024 02:00 AM EST RP Workstation: [id number]   CT ABDOMEN PELVIS W CONTRAST Result Date: 07/12/2024 EXAM: CT  ABDOMEN AND PELVIS WITH CONTRAST 07/12/2024 09:50:45 PM TECHNIQUE: CT of the abdomen and pelvis was performed with the administration of intravenous contrast. Multiplanar reformatted images are provided for review. Automated exposure control, iterative reconstruction, and/or weight-based adjustment of the mA/kV was utilized to reduce the radiation dose to as low as reasonably achievable. COMPARISON: CT 06/05/2024 CLINICAL HISTORY: Bowel obstruction suspected. FINDINGS: LOWER CHEST: Bibasilar atelectasis/scarring. No active pulmonary identified. LIVER: The liver is unremarkable. GALLBLADDER AND BILE DUCTS: Cholecystectomy. No biliary ductal dilatation. SPLEEN: No acute abnormality. PANCREAS: No acute abnormality. ADRENAL GLANDS: No acute abnormality. KIDNEYS, URETERS AND BLADDER: Nonobstructing bilateral nephrolithiasis. No hydronephrosis. No perinephric or periureteral stranding. Urinary bladder is unremarkable. GI AND BOWEL: Stomach demonstrates no acute abnormality. Postoperative change of small bowel resection with anastomosis in the left lower quadrant. Postoperative change of colectomy with ileal rectal anastomosis. There is no bowel obstruction. PERITONEUM AND RETROPERITONEUM: Postoperative change of ventral abdominal hernia repair. VASCULATURE: Aorta is normal in caliber. LYMPH NODES: No lymphadenopathy. REPRODUCTIVE ORGANS: No acute abnormality. BONES AND SOFT TISSUES: Posterior fusion in the lumbar spine. Right hip arthroplasty. Massive gas and fluid collection in the central abdominal wall measuring 23.6 x 10.0 x 26.9 cm (ML by AP by CC). This is likely postoperative related to the abdominal wall defect repair on 06/07/2024. Sterility is not determined by imaging. There is mild adjacent stranding in the central abdominal wall musculature and subcutaneous fat. Given the gas within the collection and previous concern for subtle increased density within the collection due to oral contrast extravasation, a  fistulous connection with the abutting small bowel is suspected and not definitively identified. No acute osseous abnormality. IMPRESSION: 1. Massive central abdominal wall fluid collection (23.6 x 10.0 x 26.9 cm), likely related to hernia repair. Given gas within the collection and prior concern for oral contrast extravasation, a fistulous connection with the abutting small bowel is suspected but not clearly identified.  CT abdomen and pelvis with oral contrast to evaluate for small bowel fistula. Electronically signed by: Norman Gatlin MD 07/12/2024 10:08 PM EST RP Workstation: [id number]   Cystogram Result Date: 06/28/2024 CLINICAL DATA: 83 year old male with recent history of abdominal wall repair and infected mesh removal on 06/07/2024 complicated by bladder injury with primary repair and Foley catheter placement. Patient presents for cystogram to evaluate for persistent bladder leak/injury followed by hopeful Foley catheter removal in Urology today. EXAM: CYSTOGRAM TECHNIQUE: After catheterization of the urinary bladder following sterile technique the bladder was filled with 225 mL Cysto-Hypaque 30% by drip infusion. Serial spot images were obtained during bladder filling and post draining. FLUOROSCOPY: Radiation Exposure Index (as provided by the fluoroscopic device): 36.8 mGy Kerma COMPARISON:  None Available. FINDINGS: No evidence of contrast leak or extravasation. The bladder is well distended. Bilateral outpouchings are present consistent with the presence of bladder diverticula. IMPRESSION: No evidence of contrast extravasation to suggest the presence of a bladder leak. Small bilateral diverticula versus trace reflux into the proximal ureters. Electronically Signed   By: Wilkie Lent M.D.   On: 06/28/2024 11:39    Labs:  CBC: Recent Labs    06/12/24 0527 06/13/24 0340 06/14/24 0752 07/12/24 1626  WBC 12.4* 12.1* 13.8* 8.6  HGB 7.9* 7.7* 8.5* 8.3*  HCT 23.6* 23.1* 25.9* 26.6*  PLT  212 214 214 334    COAGS: Recent Labs    06/05/24 1851  INR 1.2    BMP: Recent Labs    06/10/24 0404 06/11/24 0622 06/12/24 0527 07/12/24 1626  NA 140 139 138 136  K 4.6 3.7 3.6 4.7  CL 114* 111 111 99  CO2 19* 21* 22 23  GLUCOSE 87 111* 114* 114*  BUN 42* 34* 25* 29*  CALCIUM  8.0* 8.0* 8.1* 9.6  CREATININE 1.49* 1.37* 1.34* 1.81*  GFRNONAA 46* 51* 53* 37*    LIVER FUNCTION TESTS: Recent Labs    03/24/24 1054 03/29/24 1504 06/05/24 1851 07/12/24 1626  BILITOT 2.2* 0.6 0.8 0.8  AST 28 50* 24 24  ALT 30 50* 14 13  ALKPHOS 223* 378* 150* 186*  PROT 7.3 7.1 7.3 7.7  ALBUMIN 3.3* 3.1* 3.2* 2.7*    TUMOR MARKERS: No results for input(s): AFPTM, CEA, CA199, CHROMGRNA in the last 8760 hours.  Assessment and Plan:  Abdominal Wall Fluid Collection: Stephen Cervantes is a 83 y.o. male with a complex abdominal surgical history with recent repair of infected mesh on 10/3 who presented to the ED on 11/7 with complaints of abdominal distention, nausea, and decreased oral intake. Found to have a large abdominal wall fluid collection, likely related to most recent surgery. IR consulted for percutaneous drain placement and case approved by Dr. VEAR Lent. Procedure to be performed under moderate sedation.  -NPO since midnight -Not on AC; INR 1.4 -CBC 8.6 yesterday; afebrile -Plan for percutaneous drain placement with Dr. Lent today   Thank you for allowing our service to participate in Stephen Cervantes 's care.    Electronically Signed: Glennon CHRISTELLA Bal, PA-C   07/13/2024, 8:20 AM     I spent a total of 40 Minutes in face to face in clinical consultation, greater than 50% of which was counseling/coordinating care for abdominal wall fluid collection.

## 2024-07-13 NOTE — Assessment & Plan Note (Signed)
 Continue home losartan

## 2024-07-13 NOTE — H&P (Signed)
 History and Physical    Patient: Stephen Cervantes FMW:[id number] DOB: Jul 21, 1941 DOA: 07/12/2024 DOS: the patient was seen and examined on 07/13/2024 PCP: Fernande Ophelia [name], MD  Patient coming from: Home  Chief Complaint:  Chief Complaint  Patient presents with   Abdominal Pain    HPI: Stephen Cervantes is a 83 y.o. male with medical history significant for BPH, duodenal ulcer (s/p EGD 05/30/2024), , who is s/p exploratory laparotomy 06/07/2024 for infected mesh related to prior ventral hernia repair, being admitted with acute abdominal distention with CT showing large seroma with possible fistulous tract.  Patient had overall been doing well postop.  States his wife had been dressing his wound and he had expected amount of postoperative pain.   On the day of arrival starting around 3pm he developed significant abdominal distention prompting the visit to the ED. he denies any increase in his postoperative pain.  Has no fever or chills.  No vomiting  On arrival, vitals within normal limits. Labs notable for normal WBC of 8.6, normal lipase and LFTs except for slightly elevated alk phos of 186 Hemoglobin at baseline of 8.3, creatinine 1.81, slightly up from baseline of 1.34.  Urinalysis with moderate leuks and rare bacteria. CT abdomen and pelvis with contrast showed a massive central abdominal wall fluid collection likely related to hernia repair with concern for fistulous connection with recommendation for repeat CT AP with oral contrast.  The ED provider spoke with surgeon, Dr. Desiderio who recommended medicine admit  Patient was given an NS bolus, Zofran  and Tylenol  and started on Zosyn  Admission requested     Review of Systems: As mentioned in the history of present illness. All other systems reviewed and are negative.  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 05/29/2015   Thyroid disease    Past Surgical History:  Procedure Laterality Date   ABDOMINAL WALL DEFECT REPAIR N/A 06/07/2024   Procedure: REPAIR, ABDOMINAL WALL;  Surgeon: Desiderio Schanz, MD;  Location: [facility] OR[facility];  Service: General;  Laterality: N/A;   APPENDECTOMY     BOWEL RESECTION  06/07/2024   Procedure: EXCISION, SMALL INTESTINE;  Surgeon: Desiderio Schanz, MD;  Location: [facility];  Service: General;;   CHOLECYSTECTOMY N/A 06/07/2024   Procedure: CHOLECYSTECTOMY;  Surgeon: Desiderio Schanz, MD;  Location: [facility];  Service: General;  Laterality: N/A;   COLECTOMY  09/05/2004   Abdominal colectomy, ileostomy, Hartman procedure for perforation of cecum, sigmoid diverticulitis   ESOPHAGOGASTRODUODENOSCOPY N/A 05/30/2024   Procedure: EGD (ESOPHAGOGASTRODUODENOSCOPY);  Surgeon: [name], MD;  Location: [facility];  Service: Gastroenterology;  Laterality: N/A;   EXCISION OF MESH N/A 06/07/2024   Procedure: REMOVAL, MESH, ABDOMEN OR PELVIS;  Surgeon: Desiderio Schanz, MD;  Location: [facility] OR[facility];  Service: General;  Laterality: N/A;   HERNIA REPAIR  2008?   [facility]   ILEOSTOMY  09/05/2005   JOINT REPLACEMENT  09/05/2012   Hip Replacement [name]   KNEE SURGERY Right 09/05/2009   POSTERIOR  LUMBAR FUSION 4 LEVEL N/A 03/13/2019   Procedure: POSTERIOR LUMBAR FUSION 4 LEVEL L1-5;  Surgeon: Clois Fret, MD;  Location: [facility] ORS;  Service: Neurosurgery;  Laterality: N/A;   TRIGGER FINGER RELEASE Right 05/05/2021   Procedure: RELEASE TRIGGER FINGER/A-1 PULLEY;  Surgeon: Edie Norleen PARAS, MD;  Location: [facility] ORS;  Service: Orthopedics;  Laterality: Right;   Social History:   reports that he quit smoking about 27 years ago. His smoking use included cigarettes. He has been exposed to tobacco smoke. He has never used smokeless tobacco. He reports that he does not drink alcohol and does not use drugs.  Allergies  Allergen Reactions   Morphine And Codeine Other (See Comments)    Severe hallucinations   Tape Other (See Comments)    Silk Tape per patient peels off my skin    Family History  Problem Relation Age of Onset   Cancer Father        cancer of lung, brain  and lymphoma   Parkinson's disease Brother    Mesothelioma Brother    Kidney disease Neg Hx    Prostate cancer Neg Hx    Kidney cancer Neg Hx    Bladder Cancer Neg Hx     Prior to Admission medications   Medication Sig Start Date End Date Taking? Authorizing Provider  atorvastatin  (LIPITOR) 10 MG tablet Take 10 mg by mouth daily.     [provider]  celecoxib  (CELEBREX ) 200 MG capsule Take 1 capsule (200 mg total) by mouth daily. 03/13/23   Hilma Hastings, PA-C  ferrous sulfate 325 (65 FE) MG tablet Take 325 mg by mouth daily with breakfast.    [provider]  finasteride  (PROSCAR ) 5 MG tablet TAKE ONE TABLET EVERY DAY 01/12/24   McGowan, Clotilda A, PA-C  levothyroxine  (SYNTHROID ) 125 MCG tablet Take 125 mcg by mouth daily. 03/05/24   [provider]  loperamide  (IMODIUM ) 2 MG capsule Take 6 mg by mouth in the morning, at noon, and at bedtime.    [provider]  losartan (COZAAR) 50 MG tablet Take 50 mg by mouth daily. 03/05/24   [provider]  montelukast  (SINGULAIR ) 10 MG tablet Take 1 tablet by mouth daily. 06/16/23   [provider]  Multiple Vitamin (MULTI-VITAMIN) tablet Take 1 tablet by mouth daily.    [provider]  ondansetron  (ZOFRAN -ODT) 4 MG disintegrating tablet Take 1 tablet (4 mg total) by mouth every 8 (eight) hours as needed for nausea or vomiting. 07/12/24   Pabon, Diego F, MD  oxyCODONE  (OXY IR/ROXICODONE ) 5 MG immediate  release tablet Take 1-2 tablets (5-10 mg total) by mouth every 6 (six) hours as needed for severe pain (pain score 7-10) or breakthrough pain. 06/14/24   [name], [name], PA-C  potassium citrate  (UROCIT-K ) 10 MEQ (1080 MG) SR tablet Take 10 mEq by mouth 4 (four) times daily. 05/01/24   [provider]  sucralfate  (CARAFATE ) 1 g tablet Take 1 g by mouth in the morning, at noon, and at bedtime.    [provider]  tamsulosin  (FLOMAX ) 0.4 MG CAPS capsule TAKE 1 CAPSULE BY MOUTH ONCE DAILY 01/12/24   Helon Clotilda LABOR, PA-C    Physical Exam: Vitals:   07/12/24 1616 07/12/24 1624 07/12/24 1958 07/12/24 1959  BP: 112/64  119/84   Pulse: 95  100   Resp: 18  16   Temp: 98.3 F (36.8 C)   98.4 F (36.9 C)  TempSrc: Oral   Oral  SpO2:  99%  100%   Weight:  79 kg    Height:  5' 9 (1.753 m)     Physical Exam Vitals and nursing note reviewed.  Constitutional:      General: He is not in acute distress. HENT:     Head: Normocephalic and atraumatic.  Cardiovascular:     Rate and Rhythm: Normal rate and regular rhythm.     Heart sounds: Normal heart sounds.  Pulmonary:     Effort: Pulmonary effort is normal.     Breath sounds: Normal breath sounds.  Abdominal:     General: Abdomen is protuberant. There is distension.     Palpations: Abdomen is soft.     Tenderness: There is no abdominal tenderness.     Comments: See pic  Neurological:     Mental Status: Mental status is at baseline.     Labs on Admission: I have personally reviewed following labs and imaging studies  CBC: Recent Labs  Lab 07/12/24 1626  WBC 8.6  HGB 8.3*  HCT 26.6*  MCV 82.9  PLT 334   Basic Metabolic Panel: Recent Labs  Lab 07/12/24 1626  NA 136  K 4.7  CL 99  CO2 23  GLUCOSE 114*  BUN 29*  CREATININE 1.81*  CALCIUM  9.6   GFR: Estimated Creatinine Clearance: 30.9 mL/min (A) (by C-G formula based on SCr of 1.81 mg/dL (H)). Liver Function Tests: Recent Labs  Lab 07/12/24 1626   AST 24  ALT 13  ALKPHOS 186*  BILITOT 0.8  PROT 7.7  ALBUMIN 2.7*   Recent Labs  Lab 07/12/24 1626  LIPASE 26   No results for input(s): AMMONIA in the last 168 hours. Coagulation Profile: No results for input(s): INR, PROTIME in the last 168 hours. Cardiac Enzymes: No results for input(s): CKTOTAL, CKMB, CKMBINDEX, TROPONINI in the last 168 hours. BNP (last 3 results) No results for input(s): PROBNP in the last 8760 hours. HbA1C: No results for input(s): HGBA1C in the last 72 hours. CBG: No results for input(s): GLUCAP in the last 168 hours. Lipid Profile: No results for input(s): CHOL, HDL, LDLCALC, TRIG, CHOLHDL, LDLDIRECT in the last 72 hours. Thyroid Function Tests: No results for input(s): TSH, T4TOTAL, FREET4, T3FREE, THYROIDAB in the last 72 hours. Anemia Panel: No results for input(s): VITAMINB12, FOLATE, FERRITIN, TIBC, IRON, RETICCTPCT in the last 72 hours. Urine analysis:    Component Value Date/Time   COLORURINE YELLOW (A) 07/12/2024 1626   APPEARANCEUR HAZY (A) 07/12/2024 1626   APPEARANCEUR Clear 12/13/2023 1003   LABSPEC 1.017 07/12/2024 1626   LABSPEC 1.017 02/11/2013 0915   PHURINE 6.0 07/12/2024 1626   GLUCOSEU NEGATIVE 07/12/2024 1626   GLUCOSEU Negative 02/11/2013 0915   HGBUR NEGATIVE 07/12/2024 1626   BILIRUBINUR NEGATIVE 07/12/2024 1626   BILIRUBINUR Negative 12/13/2023 1003   BILIRUBINUR Negative 02/11/2013 0915   KETONESUR NEGATIVE 07/12/2024 1626   PROTEINUR 30 (A) 07/12/2024 1626   NITRITE NEGATIVE 07/12/2024 1626   LEUKOCYTESUR MODERATE (A) 07/12/2024 1626   LEUKOCYTESUR Negative 02/11/2013 0915    Radiological Exams on Admission: CT ABDOMEN PELVIS W CONTRAST Result Date: 07/12/2024 EXAM: CT ABDOMEN AND PELVIS WITH CONTRAST 07/12/2024 09:50:45 PM TECHNIQUE: CT of the abdomen and pelvis was performed with the administration of intravenous contrast. Multiplanar reformatted images  are provided for review. Automated exposure control, iterative reconstruction, and/or weight-based adjustment of the mA/kV was utilized to reduce the radiation dose to as low as reasonably achievable. COMPARISON: CT 06/05/2024 CLINICAL HISTORY: Bowel obstruction suspected. FINDINGS:  LOWER CHEST: Bibasilar atelectasis/scarring. No active pulmonary identified. LIVER: The liver is unremarkable. GALLBLADDER AND BILE DUCTS: Cholecystectomy. No biliary ductal dilatation. SPLEEN: No acute abnormality. PANCREAS: No acute abnormality. ADRENAL GLANDS: No acute abnormality. KIDNEYS, URETERS AND BLADDER: Nonobstructing bilateral nephrolithiasis. No hydronephrosis. No perinephric or periureteral stranding. Urinary bladder is unremarkable. GI AND BOWEL: Stomach demonstrates no acute abnormality. Postoperative change of small bowel resection with anastomosis in the left lower quadrant. Postoperative change of colectomy with ileal rectal anastomosis. There is no bowel obstruction. PERITONEUM AND RETROPERITONEUM: Postoperative change of ventral abdominal hernia repair. VASCULATURE: Aorta is normal in caliber. LYMPH NODES: No lymphadenopathy. REPRODUCTIVE ORGANS: No acute abnormality. BONES AND SOFT TISSUES: Posterior fusion in the lumbar spine. Right hip arthroplasty. Massive gas and fluid collection in the central abdominal wall measuring 23.6 x 10.0 x 26.9 cm (ML by AP by CC). This is likely postoperative related to the abdominal wall defect repair on 06/07/2024. Sterility is not determined by imaging. There is mild adjacent stranding in the central abdominal wall musculature and subcutaneous fat. Given the gas within the collection and previous concern for subtle increased density within the collection due to oral contrast extravasation, a fistulous connection with the abutting small bowel is suspected and not definitively identified. No acute osseous abnormality. IMPRESSION: 1. Massive central abdominal wall fluid collection  (23.6 x 10.0 x 26.9 cm), likely related to hernia repair. Given gas within the collection and prior concern for oral contrast extravasation, a fistulous connection with the abutting small bowel is suspected but not clearly identified. CT abdomen and pelvis with oral contrast to evaluate for small bowel fistula. Electronically signed by: Norman Gatlin MD 07/12/2024 10:08 PM EST RP Workstation: [id number]   Data Reviewed for HPI: Relevant notes from primary care and specialist visits, past discharge summaries as available in EHR, including Care Everywhere. 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 Will continue Zosyn Surgery consulted  Hyperlipidemia Continue statin  Hypothyroidism Continue levothyroxine   Essential hypertension Continue home losartan  History of duodenal ulcer No acute issues suspected S/p EGD 05/30/2024-no ulcers  BPH (benign prostatic hyperplasia) History of nephrolithiasis No acute issues Continue finasteride  and tamsulosin     DVT prophylaxis: SCD in case of procedure  Consults: surgery  Advance Care Planning:   Code Status: Prior   Family Communication: none  Disposition Plan: Back to previous home environment  Severity of Illness: The appropriate patient status for this patient is OBSERVATION. Observation status is judged to be reasonable and necessary in order to provide the required intensity of service to ensure the patient's safety. The patient's presenting symptoms, physical exam findings, and initial  radiographic and laboratory data in the context of their medical condition is felt to place them at decreased risk for further clinical deterioration. Furthermore, it is anticipated that the patient will be medically stable for discharge from the hospital within 2 midnights of admission.   Author: Delayne LULLA Solian, MD 07/13/2024 12:58 AM  For on call review [email or web address].

## 2024-07-13 NOTE — Procedures (Signed)
 Interventional Radiology Procedure Note  Procedure: Placement of a 72F multi-sidehole biliary drain via RLQ.  2,200 mL foul-smelling purulent fluid evacuated.   Complications: None  Estimated Blood Loss: None  Recommendations: - Drain to bag - Flush TID - Cultures sent   Signed,  Wilkie LOIS Lent, MD

## 2024-07-13 NOTE — Assessment & Plan Note (Signed)
 No acute issues suspected S/p EGD 05/30/2024-no ulcers

## 2024-07-13 NOTE — Assessment & Plan Note (Addendum)
 S/p exploratory laparotomy 06/07/2024 for infected ventral mesh Pain control Will keep n.p.o. Will continue Zosyn Surgery consulted

## 2024-07-13 NOTE — Assessment & Plan Note (Signed)
 Continue levothyroxine 

## 2024-07-13 NOTE — Assessment & Plan Note (Signed)
 Continue statin

## 2024-07-13 NOTE — Assessment & Plan Note (Signed)
 History of nephrolithiasis No acute issues Continue finasteride  and tamsulosin 

## 2024-07-13 NOTE — Progress Notes (Signed)
 Pharmacy Antibiotic Note  Stephen Cervantes is a 83 y.o. male admitted on 07/12/2024 with intra-abdominal.  Pharmacy has been consulted for Zosyn dosing.  Plan: Zosyn 3.375g IV q8h (4 hour infusion).  Height: 5' 9 (175.3 cm) Weight: 79 kg (174 lb 2.6 oz) IBW/kg (Calculated) : 70.7  Temp (24hrs), Avg:98.3 F (36.8 C), Min:98.2 F (36.8 C), Max:98.4 F (36.9 C)  Recent Labs  Lab 07/12/24 1626  WBC 8.6  CREATININE 1.81*    Estimated Creatinine Clearance: 30.9 mL/min (A) (by C-G formula based on SCr of 1.81 mg/dL (H)).    Allergies  Allergen Reactions   Morphine And Codeine Other (See Comments)    Severe hallucinations   Tape Other (See Comments)    Silk Tape per patient peels off my skin    Antimicrobials this admission:   >>    >>   Dose adjustments this admission:   Microbiology results:  BCx:   UCx:    Sputum:    MRSA PCR:   Thank you for allowing pharmacy to be a part of this patient's care.  Stephen Cervantes D 07/13/2024 1:12 AM

## 2024-07-14 DIAGNOSIS — L02211 Cutaneous abscess of abdominal wall: Secondary | ICD-10-CM | POA: Diagnosis not present

## 2024-07-14 DIAGNOSIS — S3011XA Contusion of abdominal wall, initial encounter: Secondary | ICD-10-CM | POA: Diagnosis not present

## 2024-07-14 LAB — URINE CULTURE: Culture: 20000 — AB

## 2024-07-14 LAB — BASIC METABOLIC PANEL WITH GFR
Anion gap: 10 (ref 5–15)
BUN: 25 mg/dL — ABNORMAL HIGH (ref 8–23)
CO2: 21 mmol/L — ABNORMAL LOW (ref 22–32)
Calcium: 7.8 mg/dL — ABNORMAL LOW (ref 8.9–10.3)
Chloride: 103 mmol/L (ref 98–111)
Creatinine, Ser: 1.4 mg/dL — ABNORMAL HIGH (ref 0.61–1.24)
GFR, Estimated: 50 mL/min — ABNORMAL LOW (ref >60)
Glucose, Bld: 92 mg/dL (ref 70–99)
Potassium: 3.8 mmol/L (ref 3.5–5.1)
Sodium: 134 mmol/L — ABNORMAL LOW (ref 135–145)

## 2024-07-14 LAB — CBC
HCT: 21.8 % — ABNORMAL LOW (ref 39.0–52.0)
Hemoglobin: 6.8 g/dL — ABNORMAL LOW (ref 13.0–17.0)
MCH: 25.3 pg — ABNORMAL LOW (ref 26.0–34.0)
MCHC: 31.2 g/dL (ref 30.0–36.0)
MCV: 81 fL (ref 80.0–100.0)
Platelets: 248 K/uL (ref 150–400)
RBC: 2.69 MIL/uL — ABNORMAL LOW (ref 4.22–5.81)
RDW: 17 % — ABNORMAL HIGH (ref 11.5–15.5)
WBC: 5.8 K/uL (ref 4.0–10.5)
nRBC: 0 % (ref 0.0–0.2)

## 2024-07-14 LAB — PREPARE RBC (CROSSMATCH)

## 2024-07-14 LAB — HEMOGLOBIN AND HEMATOCRIT, BLOOD
HCT: 27.8 % — ABNORMAL LOW (ref 39.0–52.0)
Hemoglobin: 8.9 g/dL — ABNORMAL LOW (ref 13.0–17.0)

## 2024-07-14 MED ORDER — VANCOMYCIN HCL IN DEXTROSE 1-5 GM/200ML-% IV SOLN
1000.0000 mg | INTRAVENOUS | Status: DC
  Filled 2024-07-14 (×2): qty 200

## 2024-07-14 MED ORDER — VANCOMYCIN HCL 1500 MG/300ML IV SOLN
1500.0000 mg | Freq: Once | INTRAVENOUS | Status: AC
  Administered 2024-07-14: 1500 mg via INTRAVENOUS
  Filled 2024-07-14: qty 300

## 2024-07-14 MED ORDER — IRON SUCROSE 300 MG IVPB - SIMPLE MED
300.0000 mg | Freq: Once | Status: AC
  Administered 2024-07-14: 300 mg via INTRAVENOUS
  Filled 2024-07-14: qty 300

## 2024-07-14 MED ORDER — SODIUM CHLORIDE 0.9% IV SOLUTION: Freq: Once | INTRAVENOUS | Status: AC

## 2024-07-14 NOTE — Progress Notes (Signed)
 CC: infected seroma Subjective: Better, hb dropped, getting RBC Added vanc Drain 2 lts   Objective: Vital signs in last 24 hours: Temp:  [97.6 F (36.4 C)-98.6 F (37 C)] 97.8 F (36.6 C) (11/09 1213) Pulse Rate:  [67-91] 67 (11/09 1213) Resp:  [12-20] 19 (11/09 1213) BP: (96-129)/(57-71) 105/58 (11/09 1213) SpO2:  [91 %-98 %] 95 % (11/09 1213) Last BM Date : 07/13/24  Intake/Output from previous day: 11/08 0701 - 11/09 0700 In: 92.3 [IV Piggyback:92.3] Out: 3150 [Urine:600; Drains:2550] Intake/Output this shift: No intake/output data recorded.  Physical exam:  NAD chronically ill Abd: soft, flatter, drain in place w serous fluid, no pus No peritonitis Midline wound healing well  Lab Results: CBC  Recent Labs    07/12/24 1626 07/14/24 0818  WBC 8.6 5.8  HGB 8.3* 6.8*  HCT 26.6* 21.8*  PLT 334 248   BMET Recent Labs    07/12/24 1626 07/14/24 0818  NA 136 134*  K 4.7 3.8  CL 99 103  CO2 23 21*  GLUCOSE 114* 92  BUN 29* 25*  CREATININE 1.81* 1.40*  CALCIUM  9.6 7.8*   PT/INR Recent Labs    07/13/24 0839  LABPROT 17.5*  INR 1.4*   ABG No results for input(s): PHART, HCO3 in the last 72 hours.  Invalid input(s): PCO2, PO2  Studies/Results: CT GUIDED PERITONEAL/RETROPERITONEAL FLUID DRAIN BY PERC CATH Result Date: 07/13/2024 INDICATION: Large intra-abdominal abscess collection. EXAM: CT-guided drain placement TECHNIQUE: Multidetector CT imaging of the abdomen was performed following the standard protocol without IV contrast. RADIATION DOSE REDUCTION: This exam was performed according to the departmental dose-optimization program which includes automated exposure control, adjustment of the mA and/or kV according to patient size and/or use of iterative reconstruction technique. MEDICATIONS: The patient is currently admitted to the hospital and receiving intravenous antibiotics. The antibiotics were administered within an appropriate time frame  prior to the initiation of the procedure. ANESTHESIA/SEDATION: 2 mg Versed were administered intravenously by the Radiology nurse for anxiolysis. This does not constitute moderate sedation. COMPLICATIONS: None immediate. PROCEDURE: Informed written consent was obtained from the patient after a thorough discussion of the procedural risks, benefits and alternatives. All questions were addressed. Maximal Sterile Barrier Technique was utilized including caps, mask, sterile gowns, sterile gloves, sterile drape, hand hygiene and skin antiseptic. A timeout was performed prior to the initiation of the procedure. A scanning CT scan was performed. The large fluid and gas collection is identified. A suitable skin entry site from a right lower quadrant approach was selected and marked. Local anesthesia was attained by infiltration with 1% lidocaine . A small dermatotomy was made. Under intermittent CT guidance, an 18 gauge trocar needle was advanced into the collection. A 0.035 wire was then advanced into the collection. The needle was removed. The percutaneous tract was dilated to 12 French. A skater 12 French biliary drainage catheter with multiple sideholes was advanced over the wire and formed. Aspiration was then performed evacuated in a total of 2200 mL frankly purulent fluid. The drainage catheter was then flushed. CT imaging demonstrates a collapsed abscess cavity. The catheter was secured to the skin with 0 Prolene suture. The patient tolerated the procedure well. IMPRESSION: Successful placement of a 71 French biliary drainage catheter via a right lower quadrant approach with evacuation of 2200 mL frankly purulent fluid. Samples were sent for Gram stain and culture. Electronically Signed   By: Wilkie Lent M.D.   On: 07/13/2024 18:40   CT ABDOMEN PELVIS WO CONTRAST Result  Date: 07/13/2024 EXAM: CT ABDOMEN AND PELVIS WITHOUT CONTRAST 07/13/2024 01:52:43 AM TECHNIQUE: CT of the abdomen and pelvis was performed  without the administration of intravenous contrast. Multiplanar reformatted images are provided for review. Automated exposure control, iterative reconstruction, and/or weight-based adjustment of the mA/kV was utilized to reduce the radiation dose to as low as reasonably achievable. COMPARISON: CT abdomen and pelvis 07/12/2024. CLINICAL HISTORY: eval seroma vs fistula eval seroma vs fistula FINDINGS: LOWER CHEST: No acute abnormality. LIVER: The liver is unremarkable. GALLBLADDER AND BILE DUCTS: Cholecystectomy. No biliary ductal dilatation. SPLEEN: No acute abnormality. PANCREAS: No acute abnormality. ADRENAL GLANDS: No acute abnormality. KIDNEYS, URETERS AND BLADDER: No stones in the kidneys or ureters. No hydronephrosis. No perinephric or periureteral stranding. Urinary bladder is unremarkable. GI AND BOWEL: Stomach demonstrates no acute abnormality. There is no bowel obstruction. No evidence of extraluminal oral contrast within the fluid and gas collection. Postoperative change of colectomy with ileorectal anastomosis. Small bowel anastomosis in the left lower quadrant. PERITONEUM AND RETROPERITONEUM: No ascites. No free air. VASCULATURE: Aorta is normal in caliber. LYMPH NODES: No lymphadenopathy. REPRODUCTIVE ORGANS: No acute abnormality. BONES AND SOFT TISSUES: Posterior lumbar fusion. Right hip arthroplasty. Recent postoperative change of ventral abdominal hernia repair. The large fluid and gas collection in the anterior abdominal wall is unchanged. IMPRESSION: 1. Large fluid and gas collection in the anterior abdominal wall. No evidence of extraluminal oral contrast within the collection. Electronically signed by: Norman Gatlin MD 07/13/2024 02:00 AM EST RP Workstation: [id number]   CT ABDOMEN PELVIS W CONTRAST Result Date: 07/12/2024 EXAM: CT ABDOMEN AND PELVIS WITH CONTRAST 07/12/2024 09:50:45 PM TECHNIQUE: CT of the abdomen and pelvis was performed with the administration of intravenous contrast.  Multiplanar reformatted images are provided for review. Automated exposure control, iterative reconstruction, and/or weight-based adjustment of the mA/kV was utilized to reduce the radiation dose to as low as reasonably achievable. COMPARISON: CT 06/05/2024 CLINICAL HISTORY: Bowel obstruction suspected. FINDINGS: LOWER CHEST: Bibasilar atelectasis/scarring. No active pulmonary identified. LIVER: The liver is unremarkable. GALLBLADDER AND BILE DUCTS: Cholecystectomy. No biliary ductal dilatation. SPLEEN: No acute abnormality. PANCREAS: No acute abnormality. ADRENAL GLANDS: No acute abnormality. KIDNEYS, URETERS AND BLADDER: Nonobstructing bilateral nephrolithiasis. No hydronephrosis. No perinephric or periureteral stranding. Urinary bladder is unremarkable. GI AND BOWEL: Stomach demonstrates no acute abnormality. Postoperative change of small bowel resection with anastomosis in the left lower quadrant. Postoperative change of colectomy with ileal rectal anastomosis. There is no bowel obstruction. PERITONEUM AND RETROPERITONEUM: Postoperative change of ventral abdominal hernia repair. VASCULATURE: Aorta is normal in caliber. LYMPH NODES: No lymphadenopathy. REPRODUCTIVE ORGANS: No acute abnormality. BONES AND SOFT TISSUES: Posterior fusion in the lumbar spine. Right hip arthroplasty. Massive gas and fluid collection in the central abdominal wall measuring 23.6 x 10.0 x 26.9 cm (ML by AP by CC). This is likely postoperative related to the abdominal wall defect repair on 06/07/2024. Sterility is not determined by imaging. There is mild adjacent stranding in the central abdominal wall musculature and subcutaneous fat. Given the gas within the collection and previous concern for subtle increased density within the collection due to oral contrast extravasation, a fistulous connection with the abutting small bowel is suspected and not definitively identified. No acute osseous abnormality. IMPRESSION: 1. Massive central  abdominal wall fluid collection (23.6 x 10.0 x 26.9 cm), likely related to hernia repair. Given gas within the collection and prior concern for oral contrast extravasation, a fistulous connection with the abutting small bowel is suspected but not clearly identified. CT  abdomen and pelvis with oral contrast to evaluate for small bowel fistula. Electronically signed by: Norman Gatlin MD 07/12/2024 10:08 PM EST RP Workstation: [id number]    Anti-infectives: Anti-infectives (From admission, onward)    Start     Dose/Rate Route Frequency Ordered Stop   07/15/24 1000  vancomycin  (VANCOCIN ) IVPB 1000 mg/200 mL premix        1,000 mg 200 mL/hr over 60 Minutes Intravenous Every 24 hours 07/14/24 1038     07/14/24 1130  vancomycin  (VANCOREADY) IVPB 1500 mg/300 mL        1,500 mg 150 mL/hr over 120 Minutes Intravenous  Once 07/14/24 1038     07/13/24 0500  piperacillin-tazobactam (ZOSYN) IVPB 3.375 g        3.375 g 12.5 mL/hr over 240 Minutes Intravenous Every 8 hours 07/13/24 0112     07/12/24 2300  piperacillin-tazobactam (ZOSYN) IVPB 3.375 g        3.375 g 100 mL/hr over 30 Minutes Intravenous  Once 07/12/24 2248 07/12/24 2345       Assessment/Plan: Infected seroma Gram + and gram Neg Added vanc No need fo surgical intervention RBC transfusion and iron We will follow I personally spent a total of 35 minutes in the care of the patient today including performing a medically appropriate exam/evaluation, counseling and educating, placing orders, referring and communicating with other health care professionals, documenting clinical information in the EHR, independently interpreting and reviewing images studies and coordinating care.     Laneta Luna, MD, [facility]  07/14/2024

## 2024-07-14 NOTE — Progress Notes (Signed)
 Referring Provider(s): Dr. Laneta Luna  Supervising Physician: Karalee Beat  Patient Status:  [facility] - In-pt  Chief Complaint: Abdominal wall fluid collection s/p drain placement  Brief History:  Pleasant 83 year old male with a complex abdominal surgical history secondary to bowel obstruction and recurrent ventral hernia. Most recently patient underwent ventral hernia repair with excision of infected mesh, small bowel resection, open cholecystectomy, and bladder repair on 10/3. He was discharged home on 10/10 and followed up with surgery twice, both times which he reported no issues. Patient presented to the ED on 11/7 with concerns for worsening abdominal distention and early satiety. Imaging revealed a large central abdominal wall fluid collection thought to be related to recent hernia repair. IR consulted and patient underwent percutaneous drain placement with Dr. Karalee on 11/8.  Subjective:  Patient resting in bed with wife and nurse at the bedside. Reports some mild tenderness at the drain site, but improvement in abdominal distention and discomfort. No concerns with drain at this time.   Allergies: Morphine and codeine and Tape  Medications: Prior to Admission medications   Medication Sig Start Date End Date Taking? Authorizing Provider  atorvastatin  (LIPITOR) 10 MG tablet Take 10 mg by mouth daily.     [provider]  celecoxib  (CELEBREX ) 200 MG capsule Take 1 capsule (200 mg total) by mouth daily. 03/13/23   Hilma Hastings, PA-C  ferrous sulfate 325 (65 FE) MG tablet Take 325 mg by mouth daily with breakfast.    [provider]  finasteride  (PROSCAR ) 5 MG tablet TAKE ONE TABLET EVERY DAY 01/12/24   McGowan, Clotilda A, PA-C  levothyroxine  (SYNTHROID ) 125 MCG tablet Take 125 mcg by mouth daily. 03/05/24   [provider]  loperamide  (IMODIUM ) 2 MG capsule Take 6 mg by mouth in the morning, at noon, and at bedtime.    [provider]   losartan (COZAAR) 50 MG tablet Take 50 mg by mouth daily. 03/05/24   [provider]  montelukast  (SINGULAIR ) 10 MG tablet Take 1 tablet by mouth daily. 06/16/23   [provider]  Multiple Vitamin (MULTI-VITAMIN) tablet Take 1 tablet by mouth daily.    [provider]  ondansetron  (ZOFRAN -ODT) 4 MG disintegrating tablet Take 1 tablet (4 mg total) by mouth every 8 (eight) hours as needed for nausea or vomiting. 07/12/24   Pabon, Diego F, MD  oxyCODONE  (OXY IR/ROXICODONE ) 5 MG immediate release tablet Take 1-2 tablets (5-10 mg total) by mouth every 6 (six) hours as needed for severe pain (pain score 7-10) or breakthrough pain. 06/14/24   [name], [name], PA-C  potassium citrate  (UROCIT-K ) 10 MEQ (1080 MG) SR tablet Take 10 mEq by mouth 4 (four) times daily. 05/01/24   [provider]  sucralfate  (CARAFATE ) 1 g tablet Take 1 g by mouth in the morning, at noon, and at bedtime.    [provider]  tamsulosin  (FLOMAX ) 0.4 MG CAPS capsule TAKE 1 CAPSULE BY MOUTH ONCE DAILY 01/12/24   Helon Clotilda A, PA-C    Vital Signs: BP 112/71 (BP Location: Left Arm)   Pulse 69   Temp 97.8 F (36.6 C)   Resp 19   Ht 5' 9 (1.753 m)   Wt 179 lb 3.7 oz (81.3 kg)   SpO2 94%   BMI 26.47 kg/m   Physical Exam Vitals reviewed.  Constitutional:      Appearance: Normal appearance.  Cardiovascular:     Rate and Rhythm: Normal rate.  Pulmonary:  Effort: Pulmonary effort is normal.  Abdominal:     General: Abdomen is flat.     Palpations: Abdomen is soft.     Tenderness: There is no abdominal tenderness (minimal).     Comments: RLQ drain sutured and stat locked in place. Overlying dressing clean and dry. Insertion site is non-erythematous and non-tender to palpation. Gravity bag w/ ~159mL of serous/minimally purulent drainage. Flushes and aspirates without difficulty   Skin:    General: Skin is warm and dry.  Neurological:     Mental Status: He is alert and  oriented to person, place, and time.    Drain Location: RLQ Size: Fr size: 12 Fr Date of placement: 07/13/24  Currently to: Drain collection device: gravity 24 hour output:  Output by Drain (mL) 07/12/24 0701 - 07/12/24 1900 07/12/24 1901 - 07/13/24 0700 07/13/24 0701 - 07/13/24 1900 07/13/24 1901 - 07/14/24 0700 07/14/24 0701 - 07/14/24 1400  Closed System Drain 1 RLQ Other (Comment) 12 Fr.   2200 350     Interval imaging/drain manipulation:  None  Current examination: Flushes/aspirates easily.  Insertion site unremarkable. Suture and stat lock in place. Dressed appropriately.    Labs:  CBC: Recent Labs    06/13/24 0340 06/14/24 0752 07/12/24 1626 07/14/24 0818  WBC 12.1* 13.8* 8.6 5.8  HGB 7.7* 8.5* 8.3* 6.8*  HCT 23.1* 25.9* 26.6* 21.8*  PLT 214 214 334 248    COAGS: Recent Labs    06/05/24 1851 07/13/24 0839  INR 1.2 1.4*    BMP: Recent Labs    06/11/24 0622 06/12/24 0527 07/12/24 1626 07/14/24 0818  NA 139 138 136 134*  K 3.7 3.6 4.7 3.8  CL 111 111 99 103  CO2 21* 22 23 21*  GLUCOSE 111* 114* 114* 92  BUN 34* 25* 29* 25*  CALCIUM  8.0* 8.1* 9.6 7.8*  CREATININE 1.37* 1.34* 1.81* 1.40*  GFRNONAA 51* 53* 37* 50*    LIVER FUNCTION TESTS: Recent Labs    03/24/24 1054 03/29/24 1504 06/05/24 1851 07/12/24 1626  BILITOT 2.2* 0.6 0.8 0.8  AST 28 50* 24 24  ALT 30 50* 14 13  ALKPHOS 223* 378* 150* 186*  PROT 7.3 7.1 7.3 7.7  ALBUMIN 3.3* 3.1* 3.2* 2.7*    Assessment and Plan:  Abdominal Wall Fluid Collection: Stephen Cervantes is a 83 y.o. male with a complex abdominal surgical history with recent repair of infected mesh on 10/3 who presented to the ED on 11/7 with complaints of abdominal distention, nausea, and decreased oral intake. Found to have a large abdominal wall fluid collection, likely related to most recent surgery. IR consulted and patient underwent percutaneous drain placement on 11/8 with Dr. Karalee.  -Patient reporting  improvement in abdominal distention and discomfort -Drain functioning well; flushes/aspirates easily  - ~2585mL out so far per chart -Afebrile, VSS -Noted to have drop in Hgb this morning from 8.3 < 6.8 today along with hematocrit; possibly dilutional  -Per nurse, patient to receive 1 unit of blood -No plans for discharge at this time  -Patient familiar with drain care from previous drains, but IR will provide additional drain care prior to discharge  While Inpatient: Continue TID flushes with 5 cc NS. Record output Q shift. Dressing changes QD or PRN if soiled.  Call IR APP or on call IR MD if difficulty flushing or sudden change in drain output.  Repeat imaging/possible drain injection once output < 10 mL/QD (excluding flush material). Consideration for drain removal if output  is < 10 mL/QD (excluding flush material), pending discussion with the providing surgical service.  Discharge planning: Please contact IR APP or on call IR MD prior to patient d/c to ensure appropriate follow up plans are in place. Typically patient will follow up with IR clinic 10-14 days post d/c for repeat imaging/possible drain injection. IR scheduler will contact patient with date/time of appointment. Patient will need to flush drain QD with 5 cc NS, record output QD, dressing changes every 2-3 days or earlier if soiled.   IR will continue to follow - please call with questions or concerns.   Thank you for allowing our service to participate in Stephen Cervantes 's care.   Electronically Signed: Glennon CHRISTELLA Bal, PA-C 07/14/2024, 11:20 AM    I spent a total of 15 Minutes at the the patient's bedside AND on the patient's hospital floor or unit, greater than 50% of which was counseling/coordinating care for percutaneous drain placement.

## 2024-07-14 NOTE — Progress Notes (Signed)
 Pharmacy Antibiotic Note  Stephen Cervantes is a 83 y.o. 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  Pharmacy has been consulted for vancomycin  dosing. Serum creatinine is noted to be elevated ISO unclear BL  Plan:  1) start vancomycin  1500 mg IV x 1 then 1000 mg IV every 24 hours Goal AUC 400-550. Expected AUC: 456.2 SCr used: 1.4 mg/dL (recheck in am) Levels prior to 4th maintenance dose if indicated   2) continue Zosyn 3.375g IV q8h (4 hour infusion).  Height: 5' 9 (175.3 cm) Weight: 81.3 kg (179 lb 3.7 oz) IBW/kg (Calculated) : 70.7  Temp (24hrs), Avg:98.1 F (36.7 C), Min:97.6 F (36.4 C), Max:98.6 F (37 C)  Recent Labs  Lab 07/12/24 1626 07/14/24 0818  WBC 8.6 5.8  CREATININE 1.81* 1.40*    Estimated Creatinine Clearance: 40 mL/min (A) (by C-G formula based on SCr of 1.4 mg/dL (H)).    Allergies  Allergen Reactions   Morphine And Codeine Other (See Comments)    Severe hallucinations   Tape Other (See Comments)    Silk Tape per patient peels off my skin    Antimicrobials this admission: 11/09 vancomycin  >>  11/09 Zosyn >>   Microbiology results: 11/08 WCx: fGPC, fGNR  Thank you for allowing pharmacy to be a part of this patient's care.  Stephen Cervantes 07/14/2024 10:03 AM

## 2024-07-14 NOTE — Progress Notes (Signed)
 PROGRESS NOTE   HPI was taken from Dr. Cleatus: Stephen Cervantes is a 83 y.o. male with medical history significant for BPH, duodenal ulcer (s/p EGD 05/30/2024), , who is s/p exploratory laparotomy 06/07/2024 for infected mesh related to prior ventral hernia repair, being admitted with acute abdominal distention with CT showing large seroma with possible fistulous tract.  Patient had overall been doing well postop.  States his wife had been dressing his wound and he had expected amount of postoperative pain.   On the day of arrival starting around 3pm he developed significant abdominal distention prompting the visit to the ED. he denies any increase in his postoperative pain.  Has no fever or chills.  No vomiting  On arrival, vitals within normal limits. Labs notable for normal WBC of 8.6, normal lipase and LFTs except for slightly elevated alk phos of 186 Hemoglobin at baseline of 8.3, creatinine 1.81, slightly up from baseline of 1.34.  Urinalysis with moderate leuks and rare bacteria. CT abdomen and pelvis with contrast showed a massive central abdominal wall fluid collection likely related to hernia repair with concern for fistulous connection with recommendation for repeat CT AP with oral contrast.   The ED provider spoke with surgeon, Dr. 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 S/p abd drain placed in RLQ on 07/13/24 as per IR. Abd abscess cx growing gram positive cocci, gram neg rods.  Continue on IV zosyn & started vanco. No evidence of contrast extravasation to suggest fistula. Gen surg following and recs apprec   HLD: continue on statin     Hypothyroidism: continue on levothyroxine     HTN: continue on home dose of losartan     Hx of duodenal ulcer: s/p EGD on 05/30/24 which did not show any ulcers.   BPH: continue on home dose of tamsulosin , finasteride    Normocytic anemia: will transfuse 1 unit of pRBCs for Hb 6.8. Repeat H&H ordered for 4 hours post transfusion       DVT prophylaxis: SCDs Code Status: full  Family Communication: discussed pt's care w/ pt's family at bedside and answered their questions Disposition Plan: depends on PT/OT recs   Level of care: Med-Surg Consultants:  Gen surg IR  Procedures:   Antimicrobials: zosyn, vanco    Subjective: Pt c/o malaise  Objective: Vitals:   07/13/24 1955 07/13/24 2348 07/14/24 0346 07/14/24 0755  BP: (!) 112/58 (!) 96/58 103/64 112/71  Pulse: 89 74 72 69  Resp: 18 16 16 19   Temp: 98.6 F (37 C) 98.2 F (36.8 C) 98.1 F (36.7 C) 97.8 F (36.6 C)  TempSrc: Oral     SpO2: 98% 92% 93% 94%  Weight:      Height:        Intake/Output Summary (Last 24 hours) at 07/14/2024 0933 Last data filed at 07/14/2024 0650 Gross per  24 hour  Intake 92.31 ml  Output 3150 ml  Net -3057.69 ml   Filed Weights   07/12/24 1624 07/13/24 0117  Weight: 79 kg 81.3 kg    Examination:  General exam: Appears comfortable Respiratory system: clear breath sounds b/l  Cardiovascular system: S1/S2+. No rubs or clicks.  Gastrointestinal system: abd is soft, ND, hypoactive bowel sounds Central nervous system: alert & oriented. Moves all extremities Psychiatry: Judgement and insight appears at baseline. Flat mood and affect    Data Reviewed: I  have personally reviewed following labs and imaging studies  CBC: Recent Labs  Lab 07/12/24 1626 07/14/24 0818  WBC 8.6 5.8  HGB 8.3* 6.8*  HCT 26.6* 21.8*  MCV 82.9 81.0  PLT 334 248   Basic Metabolic Panel: Recent Labs  Lab 07/12/24 1626 07/14/24 0818  NA 136 134*  K 4.7 3.8  CL 99 103  CO2 23 21*  GLUCOSE 114* 92  BUN 29* 25*  CREATININE 1.81* 1.40*  CALCIUM  9.6 7.8*   GFR: Estimated Creatinine Clearance: 40 mL/min (A) (by C-G formula based on SCr of 1.4 mg/dL (H)). Liver Function Tests: Recent Labs  Lab 07/12/24 1626  AST 24  ALT 13  ALKPHOS 186*  BILITOT 0.8  PROT 7.7  ALBUMIN 2.7*   Recent Labs  Lab 07/12/24 1626  LIPASE 26   No results for input(s): AMMONIA in the last 168 hours. Coagulation Profile: Recent Labs  Lab 07/13/24 0839  INR 1.4*   Cardiac Enzymes: No results for input(s): CKTOTAL, CKMB, CKMBINDEX, TROPONINI in the last 168 hours. BNP (last 3 results) No results for input(s): PROBNP in the last 8760 hours. HbA1C: No results for input(s): HGBA1C in the last 72 hours. CBG: No results for input(s): GLUCAP in the last 168 hours. Lipid Profile: No results for input(s): CHOL, HDL, LDLCALC, TRIG, CHOLHDL, LDLDIRECT in the last 72 hours. Thyroid Function Tests: No results for input(s): TSH, T4TOTAL, FREET4, T3FREE, THYROIDAB in the last 72 hours. Anemia Panel: No results for input(s): VITAMINB12, FOLATE, FERRITIN, TIBC, IRON, RETICCTPCT in the last 72 hours. Sepsis Labs: No results for input(s): PROCALCITON, LATICACIDVEN in the last 168 hours.  Recent Results (from the past 240 hours)  Aerobic/Anaerobic Culture w Gram Stain (surgical/deep wound)     Status: None (Preliminary result)   Collection Time: 07/13/24  6:03 PM   Specimen: Abscess  Result Value Ref Range Status   Specimen Description   Final    ABSCESS Performed at [facility], [address]  [place], [place] [address]    Special Requests   Final    NONE Performed at [facility], [address] [place], [place] [address]    Gram Stain   Final    FEW WBC PRESENT,BOTH PMN AND MONONUCLEAR FEW GRAM POSITIVE COCCI FEW GRAM NEGATIVE RODS Performed at [facility], [address], [place], [place] [address]    Culture PENDING  Incomplete   Report Status PENDING  Incomplete         Radiology Studies: CT GUIDED PERITONEAL/RETROPERITONEAL FLUID DRAIN BY [facility] CATH Result Date: 07/13/2024 INDICATION: Large intra-abdominal abscess collection. EXAM: CT-guided drain placement TECHNIQUE: Multidetector CT imaging of the abdomen was performed following the standard protocol without IV contrast. RADIATION DOSE REDUCTION: This exam was performed according to the departmental dose-optimization program which includes automated exposure control, adjustment of the mA and/or kV according to patient size and/or use of iterative reconstruction technique. MEDICATIONS: The patient is currently admitted to the  hospital and receiving intravenous antibiotics. The antibiotics were administered within an appropriate time frame prior to the initiation of the procedure. ANESTHESIA/SEDATION: 2 mg Versed were administered intravenously by the Radiology nurse for anxiolysis. This does not constitute moderate sedation. COMPLICATIONS: None immediate. PROCEDURE: Informed written consent was obtained from the patient after a thorough discussion of the procedural risks, benefits and alternatives. All questions were addressed. Maximal Sterile Barrier Technique was utilized including caps, mask, sterile gowns, sterile gloves, sterile drape, hand hygiene and skin antiseptic. A timeout was performed prior to the initiation of the procedure. A scanning CT scan was performed. The large fluid and gas collection is identified. A suitable skin entry site from a right lower quadrant approach was selected and marked. Local  anesthesia was attained by infiltration with 1% lidocaine . A small dermatotomy was made. Under intermittent CT guidance, an 18 gauge trocar needle was advanced into the collection. A 0.035 wire was then advanced into the collection. The needle was removed. The percutaneous tract was dilated to 12 French. A skater 12 French biliary drainage catheter with multiple sideholes was advanced over the wire and formed. Aspiration was then performed evacuated in a total of 2200 mL frankly purulent fluid. The drainage catheter was then flushed. CT imaging demonstrates a collapsed abscess cavity. The catheter was secured to the skin with 0 Prolene suture. The patient tolerated the procedure well. IMPRESSION: Successful placement of a 83 French biliary drainage catheter via a right lower quadrant approach with evacuation of 2200 mL frankly purulent fluid. Samples were sent for Gram stain and culture. Electronically Signed   By: Wilkie Lent M.D.   On: 07/13/2024 18:40   CT ABDOMEN PELVIS WO CONTRAST Result Date: 07/13/2024 EXAM: CT ABDOMEN AND PELVIS WITHOUT CONTRAST 07/13/2024 01:52:43 AM TECHNIQUE: CT of the abdomen and pelvis was performed without the administration of intravenous contrast. Multiplanar reformatted images are provided for review. Automated exposure control, iterative reconstruction, and/or weight-based adjustment of the mA/kV was utilized to reduce the radiation dose to as low as reasonably achievable. COMPARISON: CT abdomen and pelvis 07/12/2024. CLINICAL HISTORY: eval seroma vs fistula eval seroma vs fistula FINDINGS: LOWER CHEST: No acute abnormality. LIVER: The liver is unremarkable. GALLBLADDER AND BILE DUCTS: Cholecystectomy. No biliary ductal dilatation. SPLEEN: No acute abnormality. PANCREAS: No acute abnormality. ADRENAL GLANDS: No acute abnormality. KIDNEYS, URETERS AND BLADDER: No stones in the kidneys or ureters. No hydronephrosis. No perinephric or periureteral stranding. Urinary bladder  is unremarkable. GI AND BOWEL: Stomach demonstrates no acute abnormality. There is no bowel obstruction. No evidence of extraluminal oral contrast within the fluid and gas collection. Postoperative change of colectomy with ileorectal anastomosis. Small bowel anastomosis in the left lower quadrant. PERITONEUM AND RETROPERITONEUM: No ascites. No free air. VASCULATURE: Aorta is normal in caliber. LYMPH NODES: No lymphadenopathy. REPRODUCTIVE ORGANS: No acute abnormality. BONES AND SOFT TISSUES: Posterior lumbar fusion. Right hip arthroplasty. Recent postoperative change of ventral abdominal hernia repair. The large fluid and gas collection in the anterior abdominal wall is unchanged. IMPRESSION: 1. Large fluid and gas collection in the anterior abdominal wall. No evidence of extraluminal oral contrast within the collection. Electronically signed by: Norman Gatlin MD 07/13/2024 02:00 AM EST RP Workstation: [id number]   CT ABDOMEN PELVIS W CONTRAST Result Date: 07/12/2024 EXAM: CT ABDOMEN AND PELVIS WITH CONTRAST 07/12/2024 09:50:45 PM TECHNIQUE: CT of the abdomen and pelvis was performed with the administration of intravenous contrast. Multiplanar reformatted images are provided for review. Automated exposure control, iterative reconstruction, and/or weight-based adjustment  of the mA/kV was utilized to reduce the radiation dose to as low as reasonably achievable. COMPARISON: CT 06/05/2024 CLINICAL HISTORY: Bowel obstruction suspected. FINDINGS: LOWER CHEST: Bibasilar atelectasis/scarring. No active pulmonary identified. LIVER: The liver is unremarkable. GALLBLADDER AND BILE DUCTS: Cholecystectomy. No biliary ductal dilatation. SPLEEN: No acute abnormality. PANCREAS: No acute abnormality. ADRENAL GLANDS: No acute abnormality. KIDNEYS, URETERS AND BLADDER: Nonobstructing bilateral nephrolithiasis. No hydronephrosis. No perinephric or periureteral stranding. Urinary bladder is unremarkable. GI AND BOWEL: Stomach  demonstrates no acute abnormality. Postoperative change of small bowel resection with anastomosis in the left lower quadrant. Postoperative change of colectomy with ileal rectal anastomosis. There is no bowel obstruction. PERITONEUM AND RETROPERITONEUM: Postoperative change of ventral abdominal hernia repair. VASCULATURE: Aorta is normal in caliber. LYMPH NODES: No lymphadenopathy. REPRODUCTIVE ORGANS: No acute abnormality. BONES AND SOFT TISSUES: Posterior fusion in the lumbar spine. Right hip arthroplasty. Massive gas and fluid collection in the central abdominal wall measuring 23.6 x 10.0 x 26.9 cm (ML by AP by CC). This is likely postoperative related to the abdominal wall defect repair on 06/07/2024. Sterility is not determined by imaging. There is mild adjacent stranding in the central abdominal wall musculature and subcutaneous fat. Given the gas within the collection and previous concern for subtle increased density within the collection due to oral contrast extravasation, a fistulous connection with the abutting small bowel is suspected and not definitively identified. No acute osseous abnormality. IMPRESSION: 1. Massive central abdominal wall fluid collection (23.6 x 10.0 x 26.9 cm), likely related to hernia repair. Given gas within the collection and prior concern for oral contrast extravasation, a fistulous connection with the abutting small bowel is suspected but not clearly identified. CT abdomen and pelvis with oral contrast to evaluate for small bowel fistula. Electronically signed by: Norman Gatlin MD 07/12/2024 10:08 PM EST RP Workstation: [id number]        Scheduled Meds:  sodium chloride    Intravenous Once   atorvastatin   10 mg Oral Daily   feeding supplement  237 mL Oral BID BM   finasteride   5 mg Oral Daily   levothyroxine   125 mcg Oral Q0600   losartan  50 mg Oral Daily   sodium chloride  flush  5 mL Intracatheter Q8H   tamsulosin   0.4 mg Oral Daily   Continuous Infusions:   piperacillin-tazobactam (ZOSYN)  IV 3.375 g (07/14/24 0603)     LOS: 1 day      Anthony CHRISTELLA Pouch, MD [facility] Pager [phone]  If 7PM-7AM, please contact night-coverage  07/14/2024, 9:33 AM

## 2024-07-15 DIAGNOSIS — S3011XA Contusion of abdominal wall, initial encounter: Secondary | ICD-10-CM | POA: Diagnosis not present

## 2024-07-15 LAB — BASIC METABOLIC PANEL WITH GFR
Anion gap: 10 (ref 5–15)
BUN: 19 mg/dL (ref 8–23)
CO2: 22 mmol/L (ref 22–32)
Calcium: 8.2 mg/dL — ABNORMAL LOW (ref 8.9–10.3)
Chloride: 104 mmol/L (ref 98–111)
Creatinine, Ser: 1.17 mg/dL (ref 0.61–1.24)
GFR, Estimated: >60 mL/min (ref >60)
Glucose, Bld: 108 mg/dL — ABNORMAL HIGH (ref 70–99)
Potassium: 3.7 mmol/L (ref 3.5–5.1)
Sodium: 136 mmol/L (ref 135–145)

## 2024-07-15 LAB — TYPE AND SCREEN
ABO/RH(D): B POS
Antibody Screen: NEGATIVE
Unit division: 0

## 2024-07-15 LAB — CBC
HCT: 28.1 % — ABNORMAL LOW (ref 39.0–52.0)
Hemoglobin: 8.8 g/dL — ABNORMAL LOW (ref 13.0–17.0)
MCH: 25.6 pg — ABNORMAL LOW (ref 26.0–34.0)
MCHC: 31.3 g/dL (ref 30.0–36.0)
MCV: 81.7 fL (ref 80.0–100.0)
Platelets: 299 K/uL (ref 150–400)
RBC: 3.44 MIL/uL — ABNORMAL LOW (ref 4.22–5.81)
RDW: 16.8 % — ABNORMAL HIGH (ref 11.5–15.5)
WBC: 6.1 K/uL (ref 4.0–10.5)
nRBC: 0 % (ref 0.0–0.2)

## 2024-07-15 LAB — BPAM RBC
Blood Product Expiration Date: 202512062359
ISSUE DATE / TIME: 202511091146
Unit Type and Rh: 7300

## 2024-07-15 MED ORDER — VANCOMYCIN HCL 1250 MG/250ML IV SOLN
1250.0000 mg | INTRAVENOUS | Status: DC
  Administered 2024-07-15 – 2024-07-16 (×2): 1250 mg via INTRAVENOUS
  Filled 2024-07-15 (×2): qty 250

## 2024-07-15 MED ORDER — SODIUM CHLORIDE 0.9 % IV SOLN: INTRAVENOUS | Status: DC | PRN

## 2024-07-15 NOTE — Plan of Care (Signed)
  Problem: Skin Integrity: Goal: Risk for impaired skin integrity will decrease Outcome: Progressing   Problem: Safety: Goal: Ability to remain free from injury will improve Outcome: Progressing   Problem: Nutrition: Goal: Adequate nutrition will be maintained Outcome: Progressing   

## 2024-07-15 NOTE — Progress Notes (Signed)
 Mobility Specialist - Progress Note   07/15/24 1444  Mobility  Activity Ambulated with assistance  Level of Assistance Standby assist, set-up cues, supervision of patient - no hands on  Assistive Device None (pushing IV pole)  Distance Ambulated (ft) 320 ft  Activity Response Tolerated well  Mobility visit 1 Mobility  Mobility Specialist Start Time (ACUTE ONLY) 1425  Mobility Specialist Stop Time (ACUTE ONLY) 1434  Mobility Specialist Time Calculation (min) (ACUTE ONLY) 9 min   Pt amb two laps around the NS, tolerated well. Pt returned to the room, left in fowler position with needs within reach.  America Silvan Mobility Specialist 07/15/24 2:46 PM

## 2024-07-15 NOTE — Plan of Care (Signed)

## 2024-07-15 NOTE — Progress Notes (Signed)
 Pharmacy Antibiotic Note  Stephen Cervantes is a 83 y.o. 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  Pharmacy has been consulted for vancomycin  dosing.   Scr 1.4 > 1.17 (Unclear BL)  Plan: Adjust vancomycin  to 1250 mg IV every 24 hours (eAUC 484.2, Scr 1.17, IBW used, Vd 0.72 L/kg) Continue Zosyn 3.375 g IV every 8 hours Continue to monitor renal function, clinical status, culture data, and antibiotic LOT  Height: 5' 9 (175.3 cm) Weight: 81.3 kg (179 lb 3.7 oz) IBW/kg (Calculated) : 70.7  Temp (24hrs), Avg:98 F (36.7 C), Min:97.5 F (36.4 C), Max:98.6 F (37 C)  Recent Labs  Lab 07/12/24 1626 07/14/24 0818 07/15/24 0722  WBC 8.6 5.8 6.1  CREATININE 1.81* 1.40* 1.17    Estimated Creatinine Clearance: 47.8 mL/min (by C-G formula based on SCr of 1.17 mg/dL).    Allergies  Allergen Reactions   Morphine And Codeine Other (See Comments)    Severe hallucinations   Tape Other (See Comments)    Silk Tape per patient peels off my skin    Antimicrobials this admission: 11/09 vancomycin  >>  11/09 Zosyn >>   Microbiology results: 11/08 WCx: fGPC, fGNR  Thank you for involving pharmacy in this patient's care.   Damien Napoleon, PharmD Clinical Pharmacist 07/15/2024 10:19 AM

## 2024-07-15 NOTE — Progress Notes (Signed)
 [facility] SURGICAL PROGRESS NOTE (cpt [phone])  Hospital Day(s): 2.   Interval History: Patient seen and examined, no acute events or new complaints overnight. Patient reports he is feeling better. His abdominal distension is improved. Abdominal pain improved. No fever, chills, nausea, emesis. Labs this morning are pending. Drain with 300 ccs out; serous looking. Cx from this with GPC and GNR.He is on Regular diet; + BM. Continue on Vanc & Zosyn.  Review of Systems:  Constitutional: denies fever, chills  HEENT: denies cough or congestion  Respiratory: denies any shortness of breath  Cardiovascular: denies chest pain or palpitations  Gastrointestinal: denies abdominal pain, N/V Genitourinary: denies burning with urination or urinary frequency Musculoskeletal: denies pain, decreased motor or sensation   Vital signs in last 24 hours: [min-max] current  Temp:  [97.5 F (36.4 C)-98.6 F (37 C)] 97.9 F (36.6 C) (11/10 0749) Pulse Rate:  [64-76] 67 (11/10 0749) Resp:  [16-20] 17 (11/10 0749) BP: (98-173)/(57-85) 138/70 (11/10 0749) SpO2:  [94 %-97 %] 94 % (11/10 0749)     Height: 5' 9 (175.3 cm) Weight: 81.3 kg BMI (Calculated): 26.46   Intake/Output last 2 shifts:  11/09 0701 - 11/10 0700 In: 635.6 [Blood:425; IV Piggyback:190.6] Out: 300 [Drains:300]   Physical Exam:  Constitutional: alert, cooperative and no distress  HENT: normocephalic without obvious abnormality  Eyes: PERRL, EOM's grossly intact and symmetric  Respiratory: breathing non-labored at rest  Cardiovascular: regular rate and sinus rhythm  Gastrointestinal: soft, non-tender, and non-distended. Drain in right abdomen; output appears serous this AM Integumentary: Previous laparotomy is healing well, small areas needing epithelization, no erythema or drainage    Labs:     Latest Ref Rng & Units 07/14/2024    3:02 PM 07/14/2024    8:18 AM 07/12/2024    4:26 PM  CBC  WBC 4.0 - 10.5 K/uL  5.8  8.6    Hemoglobin 13.0 - 17.0 g/dL 8.9  6.8  8.3   Hematocrit 39.0 - 52.0 % 27.8  21.8  26.6   Platelets 150 - 400 K/uL  248  334       Latest Ref Rng & Units 07/14/2024    8:18 AM 07/12/2024    4:26 PM 06/12/2024    5:27 AM  CMP  Glucose 70 - 99 mg/dL 92  885  885   BUN 8 - 23 mg/dL 25  29  25    Creatinine 0.61 - 1.24 mg/dL 8.59  8.18  8.65   Sodium 135 - 145 mmol/L 134  136  138   Potassium 3.5 - 5.1 mmol/L 3.8  4.7  3.6   Chloride 98 - 111 mmol/L 103  99  111   CO2 22 - 32 mmol/L 21  23  22    Calcium  8.9 - 10.3 mg/dL 7.8  9.6  8.1   Total Protein 6.5 - 8.1 g/dL  7.7    Total Bilirubin 0.0 - 1.2 mg/dL  0.8    Alkaline Phos 38 - 126 U/L  186    AST 15 - 41 U/L  24    ALT 0 - 44 U/L  13       Imaging studies: No new pertinent imaging studies   Assessment/Plan: 83 y.o. male with infected seroma s/p percutaneous drainage on 11/08 s/p exploratory laparotomy, extensive lysis of adhesions, excision of infected mesh, small bowel resection with anastomosis, open cholecystectomy, bladder repair, recurrent ventral hernia repair with bilateral myocutaneous flaps with anterior release on 10/03   -  Okay to continue diet as tolerated  - Continue percutaneous drain; monitor and record output   - Continue IV Abx (Vancomycin , Zosyn); Follow up Cx  - Monitor abdominal examination   - Monitor bowel function; baseline diarrhea - continue imodium    - Pain control prn; antiemetics prn - Okay to mobilize - Further management per primary service; we will follow   - Discharge Planning: He is overall doing reasonably well;. I do think it is prudent to keep him for IV Abx until final Cx results to tailor Abx for home. He certainly will need to go home with drain.    All of the above findings and recommendations were discussed with the patient, and the medical team, and all of patient's questions were answered to his expressed satisfaction.   -- Arthea Platt, PA-C [facility] 07/15/2024, 8:50 AM M-F: 7am - 4pm

## 2024-07-15 NOTE — Progress Notes (Signed)
 PROGRESS NOTE Stephen Cervantes  FMW:[id number] DOB: May 17, 1941 DOA: 07/12/2024 PCP: Fernande Ophelia [name], MD   Stephen Cervantes is a 83 y.o. male with medical history significant for BPH, duodenal ulcer (s/p EGD 05/30/2024) who is s/p exploratory laparotomy 06/07/2024 for infected mesh related to prior ventral hernia repair, being admitted with acute abdominal distention with CT showing large seroma with possible fistulous tract.  On arrival, vitals within normal limits. Labs notable for normal WBC of 8.6, normal lipase and LFTs except for slightly elevated alk phos of 186 Hemoglobin at baseline of 8.3, creatinine 1.81, slightly up from baseline of 1.34.  Urinalysis with moderate leuks and rare bacteria. CT abdomen and pelvis with contrast showed a massive central abdominal wall fluid collection likely related to hernia repair with concern for fistulous connection with recommendation for repeat CT AP with oral contrast. General surgery and IR were consulted. IR placed drain in RLQ on 11/8 and he has been started on empiric Abx awaiting wound cultures.    Assessment and Plan: Postoperative abdominal wall abscess: s/p exploratory laparotomy 06/07/2024 for infected ventral mesh. S/p abd drain placed in RLQ on 07/13/24 as per IR.  Abd abscess Cx growing citrobactor, klebsiella.  - Continue on IV zosyn & vanco. Pending sensitivities can transition to PO and dc. Patient is clinically doing very well.  - general surgery following- appreciate your care - IR recs for drain follow up as follows: - Please contact IR APP or on call IR MD prior to patient d/c to ensure appropriate follow up plans are in place. Typically patient will follow up with IR clinic 10-14 days post d/c for repeat imaging/possible drain injection. IR scheduler will contact patient with date/time of appointment. Patient will need to flush drain QD with 5 cc NS, record output QD, dressing changes every 2-3 days or earlier if soiled.   HLD:  continue on statin     Hypothyroidism: continue on levothyroxine     HTN: continue on home dose of losartan     Hx of duodenal ulcer: s/p EGD on 05/30/24 which did not show any ulcers.   BPH: continue on home dose of tamsulosin , finasteride    Normocytic anemia: hgb improved well with transfusing 1 unit of pRBCs for Hb 6.8 yesterday. Continue to monitor for signs of bleeding.   DVT prophylaxis: SCDs Code Status: full  Family Communication: discussed pt's care w/ pt's family at bedside and answered their questions Disposition Plan: home tomorrow  Level of care: Med-Surg Consultants:  Gen surg IR  Subjective: Pt reports feeling well. Denies abdominal pain. Wants to go home and wants to know if he can shower.   Objective: Vitals:   07/14/24 1535 07/14/24 2149 07/15/24 0044 07/15/24 0446  BP: 126/71 127/78 134/80 (!) 173/85  Pulse: 76 66 64 69  Resp: 17 16 18 18   Temp: 98.4 F (36.9 C) (!) 97.5 F (36.4 C) 98.3 F (36.8 C) 98.6 F (37 C)  TempSrc:  Oral    SpO2: 97% 94% 97% 96%  Weight:      Height:        Intake/Output Summary (Last 24 hours) at 07/15/2024 0739 Last data filed at 07/15/2024 0636 Gross per 24 hour  Intake 635.61 ml  Output 300 ml  Net 335.61 ml   Filed Weights   07/12/24 1624 07/13/24 0117  Weight: 79 kg 81.3 kg   Examination:  General exam: Appears comfortable Respiratory system: clear breath sounds b/l  Cardiovascular system: S1/S2+. No rubs or  clicks.  Gastrointestinal system: abd is soft, ND, hypoactive bowel sounds. Surgical bandages are clean and intact. Drain in place with clear/green drainage Central nervous system: alert & oriented. Moves all extremities Psychiatry: Judgement and insight appears at baseline. Flat mood and affect  Data Reviewed: I have personally reviewed following labs and imaging studies  CBC: Recent Labs  Lab 07/12/24 1626 07/14/24 0818 07/14/24 1502  WBC 8.6 5.8  --   HGB 8.3* 6.8* 8.9*  HCT 26.6* 21.8*  27.8*  MCV 82.9 81.0  --   PLT 334 248  --    Basic Metabolic Panel: Recent Labs  Lab 07/12/24 1626 07/14/24 0818  NA 136 134*  K 4.7 3.8  CL 99 103  CO2 23 21*  GLUCOSE 114* 92  BUN 29* 25*  CREATININE 1.81* 1.40*  CALCIUM  9.6 7.8*   GFR: Estimated Creatinine Clearance: 40 mL/min (A) (by C-G formula based on SCr of 1.4 mg/dL (H)).   LOS: 2 days    Stephen LITTIE Piety, MD [facility]  If 7PM-7AM, please contact night-coverage  07/15/2024, 7:39 AM

## 2024-07-16 ENCOUNTER — Other Ambulatory Visit: Payer: Self-pay

## 2024-07-16 DIAGNOSIS — S3011XA Contusion of abdominal wall, initial encounter: Secondary | ICD-10-CM | POA: Diagnosis not present

## 2024-07-16 LAB — AEROBIC/ANAEROBIC CULTURE W GRAM STAIN (SURGICAL/DEEP WOUND)

## 2024-07-16 MED ORDER — ACETAMINOPHEN 325 MG PO TABS: 650.0000 mg | ORAL_TABLET | Freq: Four times a day (QID) | ORAL | 0 refills | Status: AC | PRN

## 2024-07-16 MED ORDER — METRONIDAZOLE 500 MG PO TABS
500.0000 mg | ORAL_TABLET | Freq: Two times a day (BID) | ORAL | 0 refills | Status: AC
  Filled 2024-07-16: qty 22, 11d supply, fill #0

## 2024-07-16 MED ORDER — CIPROFLOXACIN HCL 500 MG PO TABS: 500.0000 mg | ORAL_TABLET | Freq: Two times a day (BID) | ORAL | 0 refills | Status: AC

## 2024-07-16 MED ORDER — SODIUM CHLORIDE FLUSH 0.9 % IV SOLN
10.0000 mL | Freq: Every day | INTRAVENOUS | 0 refills | Status: AC
  Filled 2024-07-16: qty 300, 30d supply, fill #0

## 2024-07-16 NOTE — Progress Notes (Incomplete)
 PROGRESS NOTE [name]  FMW:[id number] DOB: Feb 02, 1941 DOA: 07/12/2024 PCP: Fernande Ophelia [name], MD   Stephen Cervantes is a 83 y.o. male with medical history significant for BPH, duodenal ulcer (s/p EGD 05/30/2024) who is s/p exploratory laparotomy 06/07/2024 for infected mesh related to prior ventral hernia repair, being admitted with acute abdominal distention with CT showing large seroma with possible fistulous tract.  On arrival, vitals within normal limits. Labs notable for normal WBC of 8.6, normal lipase and LFTs except for slightly elevated alk phos of 186 Hemoglobin at baseline of 8.3, creatinine 1.81, slightly up from baseline of 1.34.  Urinalysis with moderate leuks and rare bacteria. CT abdomen and pelvis with contrast showed a massive central abdominal wall fluid collection likely related to hernia repair with concern for fistulous connection with recommendation for repeat CT AP with oral contrast. General surgery and IR were consulted. IR placed drain in RLQ on 11/8 and he has been started on empiric Abx awaiting wound cultures.    Assessment and Plan: Postoperative abdominal wall abscess: s/p exploratory laparotomy 06/07/2024 for infected ventral mesh. S/p abd drain placed in RLQ on 07/13/24 as per IR.  Abd abscess Cx growing citrobactor, klebsiella.  - Continue on IV zosyn & vanco. Pending sensitivities can transition to PO and dc. Patient is clinically doing very well.  - general surgery following- appreciate your care - IR recs for drain follow up as follows: - Please contact IR APP or on call IR MD prior to patient d/c to ensure appropriate follow up plans are in place. Typically patient will follow up with IR clinic 10-14 days post d/c for repeat imaging/possible drain injection. IR scheduler will contact patient with date/time of appointment. Patient will need to flush drain QD with 5 cc NS, record output QD, dressing changes every 2-3 days or earlier if soiled.   HLD:  continue on statin     Hypothyroidism: continue on levothyroxine     HTN: continue on home dose of losartan     Hx of duodenal ulcer: s/p EGD on 05/30/24 which did not show any ulcers.   BPH: continue on home dose of tamsulosin , finasteride    Normocytic anemia: hgb improved well with transfusing 1 unit of pRBCs for Hb 6.8 yesterday. Continue to monitor for signs of bleeding.   DVT prophylaxis: SCDs Code Status: full  Family Communication: discussed pt's care w/ pt's family at bedside and answered their questions Disposition Plan: home tomorrow  Level of care: Med-Surg Consultants:  Gen surg IR  Subjective: Pt reports feeling well. Denies abdominal pain. Wants to go home and wants to know if he can shower.   Objective: Vitals:   07/15/24 0749 07/15/24 1718 07/15/24 2059 07/16/24 0443  BP: 138/70 138/79 (!) 154/84 (!) 147/84  Pulse: 67 73 72 71  Resp: 17 18 18 18   Temp: 97.9 F (36.6 C) 98.2 F (36.8 C) 97.6 F (36.4 C) 98.1 F (36.7 C)  TempSrc: Oral     SpO2: 94% 97% 97% 97%  Weight:      Height:        Intake/Output Summary (Last 24 hours) at 07/16/2024 0732 Last data filed at 07/16/2024 0615 Gross per 24 hour  Intake 637.23 ml  Output 175 ml  Net 462.23 ml   Filed Weights   07/12/24 1624 07/13/24 0117  Weight: 79 kg 81.3 kg   Examination:  General exam: Appears comfortable Respiratory system: clear breath sounds b/l  Cardiovascular system: S1/S2+. No rubs or  clicks.  Gastrointestinal system: abd is soft, ND, hypoactive bowel sounds. Surgical bandages are clean and intact. Drain in place with clear/green drainage Central nervous system: alert & oriented. Moves all extremities Psychiatry: Judgement and insight appears at baseline. Flat mood and affect  Data Reviewed: I have personally reviewed following labs and imaging studies  CBC: Recent Labs  Lab 07/12/24 1626 07/14/24 0818 07/14/24 1502 07/15/24 0722  WBC 8.6 5.8  --  6.1  HGB 8.3* 6.8* 8.9*  8.8*  HCT 26.6* 21.8* 27.8* 28.1*  MCV 82.9 81.0  --  81.7  PLT 334 248  --  299   Basic Metabolic Panel: Recent Labs  Lab 07/12/24 1626 07/14/24 0818 07/15/24 0722  NA 136 134* 136  K 4.7 3.8 3.7  CL 99 103 104  CO2 23 21* 22  GLUCOSE 114* 92 108*  BUN 29* 25* 19  CREATININE 1.81* 1.40* 1.17  CALCIUM  9.6 7.8* 8.2*   GFR: Estimated Creatinine Clearance: 47.8 mL/min (by C-G formula based on SCr of 1.17 mg/dL).   LOS: 3 days    Marien LITTIE Piety, MD [facility]  If 7PM-7AM, please contact night-coverage  07/16/2024, 7:32 AM

## 2024-07-16 NOTE — Progress Notes (Signed)
 [facility] SURGICAL PROGRESS NOTE (cpt [id number])  Hospital Day(s): 3.   Interval History: Patient seen and examined, no acute events or new complaints overnight. Patient reports he is doing well. Some discomfort from the drain itself. No fever, chills, nausea, emesis. Drain with 175 ccs out; serousserous looking. Cx from this with Klebsiella, Citrobacter.He is on Regular diet; + BM. Continue on Vanc & Zosyn.  Review of Systems:  Constitutional: denies fever, chills  HEENT: denies cough or congestion  Respiratory: denies any shortness of breath  Cardiovascular: denies chest pain or palpitations  Gastrointestinal: denies abdominal pain, N/V Genitourinary: denies burning with urination or urinary frequency Musculoskeletal: denies pain, decreased motor or sensation   Vital signs in last 24 hours: [min-max] current  Temp:  [97.6 F (36.4 C)-98.2 F (36.8 C)] 98.1 F (36.7 C) (11/11 0443) Pulse Rate:  [67-73] 71 (11/11 0443) Resp:  [17-18] 18 (11/11 0443) BP: (138-154)/(70-84) 147/84 (11/11 0443) SpO2:  [94 %-97 %] 97 % (11/11 0443)     Height: 5' 9 (175.3 cm) Weight: 81.3 kg BMI (Calculated): 26.46   Intake/Output last 2 shifts:  11/10 0701 - 11/11 0700 In: 637.2 [P.O.:490; I.V.:37.2; IV Piggyback:100] Out: 175 [Drains:175]   Physical Exam:  Constitutional: alert, cooperative and no distress  HENT: normocephalic without obvious abnormality  Eyes: PERRL, EOM's grossly intact and symmetric  Respiratory: breathing non-labored at rest  Cardiovascular: regular rate and sinus rhythm  Gastrointestinal: soft, non-tender, and non-distended. Drain in right abdomen; output appears serous this AM Integumentary: Previous laparotomy is healing well, small areas needing epithelization, no erythema or drainage    Labs:     Latest Ref Rng & Units 07/15/2024    7:22 AM 07/14/2024    3:02 PM 07/14/2024    8:18 AM  CBC  WBC 4.0 - 10.5 K/uL 6.1   5.8   Hemoglobin 13.0 - 17.0  g/dL 8.8  8.9  6.8   Hematocrit 39.0 - 52.0 % 28.1  27.8  21.8   Platelets 150 - 400 K/uL 299   248       Latest Ref Rng & Units 07/15/2024    7:22 AM 07/14/2024    8:18 AM 07/12/2024    4:26 PM  CMP  Glucose 70 - 99 mg/dL 891  92  885   BUN 8 - 23 mg/dL 19  25  29    Creatinine 0.61 - 1.24 mg/dL 8.82  8.59  8.18   Sodium 135 - 145 mmol/L 136  134  136   Potassium 3.5 - 5.1 mmol/L 3.7  3.8  4.7   Chloride 98 - 111 mmol/L 104  103  99   CO2 22 - 32 mmol/L 22  21  23    Calcium  8.9 - 10.3 mg/dL 8.2  7.8  9.6   Total Protein 6.5 - 8.1 g/dL   7.7   Total Bilirubin 0.0 - 1.2 mg/dL   0.8   Alkaline Phos 38 - 126 U/L   186   AST 15 - 41 U/L   24   ALT 0 - 44 U/L   13      Imaging studies: No new pertinent imaging studies   Assessment/Plan: 83 y.o. male with infected seroma s/p percutaneous drainage on 11/08 s/p exploratory laparotomy, extensive lysis of adhesions, excision of infected mesh, small bowel resection with anastomosis, open cholecystectomy, bladder repair, recurrent ventral hernia repair with bilateral myocutaneous flaps with anterior release on 10/03   - Okay to continue diet as tolerated  -  Continue percutaneous drain; monitor and record output   - Continue IV Abx (Vancomycin , Zosyn); Cx with Klebsiella, Citrobacter - susceptibilities pending   - Monitor abdominal examination   - Monitor bowel function; baseline diarrhea - continue imodium    - Pain control prn; antiemetics prn - Okay to mobilize - Further management per primary service; we will follow   - Discharge Planning: He is reasonable for discharge from surgical perspective, we are awaiting susceptibilities to tailor therapy for home. He will certainly need to go with the drain. He has follow up on 11/14, which we will leave for now.   All of the above findings and recommendations were discussed with the patient, and the medical team, and all of patient's questions were answered to his expressed  satisfaction.   -- Arthea Platt, PA-C [facility] 07/16/2024, 7:08 AM M-F: 7am - 4pm

## 2024-07-16 NOTE — Discharge Instructions (Addendum)
 Complete your full course of the antibiotic that is prescribed. Follow up with general surgery and radiology  Radiology instructions: Typically patient will follow up with IR clinic 10-14 days post d/c for repeat imaging/possible drain injection. IR scheduler will contact patient with date/time of appointment. Patient will need to flush drain daily with 5 cc normal saline, record output daily, dressing changes every 2-3 days or earlier if soiled.    Interventional Radiology Percutaneous Drain Placement After Care   This sheet gives you information about how to care for yourself after your procedure. Your health care provider may also give you more specific instructions. Your drain was placed by an interventional radiologist with [facility]. If you have questions or concerns, contact [facility] at [phone].   What is a percutaneous drain?   A drain is a small plastic tube (catheter) that goes into the fluid collection in your body through your skin.   How long will I need the drain?   How long the drain needs to stay in is determined by where the drain is, how much comes out of the drain each day and if you are having any other surgical procedures.   Interventional radiology will determine when it is time to remove the drain. It is important to follow up as directed so that the drain can be removed as soon as it is safe to do so.   What can I expect after the procedure?   After the procedure, it is common to have:   A small amount of bruising and discomfort in the area where the drainage tube (catheter) was placed.   Sleepiness and fatigue. This should go away after the medicines you were given have worn off.   Follow these instructions at home:   Insertion site care   Check your insertion site when you change the bandage. Check for:   More redness, swelling, or pain.   More fluid or blood.   Warmth.   Pus or a bad smell.   When caring for your  insertion site:   Wash your hands with soap and water for at least 20 seconds before and after you change your bandage (dressing). If soap and water are not available, use hand sanitizer.   You do not need to change your dressing everyday if it is clean and dry. Change your dressing every 3 days or as needed when it is soiled, wet or becoming dislodged. You will need to change your dressing each time you shower.   Leave stitches (sutures), skin glue, or adhesive strips in place. These skin closures may need to stay in place for 2 weeks or longer. If adhesive strip edges start to loosen and curl up, you may trim the loose edges. Do not remove adhesive strips completely unless your health care provider tells you to do so.   Catheter care   Flush the catheter once per day with 5 mL of 0.9% normal saline unless you are told otherwise by your healthcare provider. This helps to prevent clogs in the catheter.   To disconnect the drain, turn the clear plastic tube to the left. Attach the saline syringe by placing it on the white end of the drain and turning gently to the right. Once attached gently push the plunger to the 5 mL mark. After you are done flushing, disconnect the syringe by turning to the left and reattach your drainage container   If you have a bulb please be sure the bulb  is charged after reconnecting it - to do this pinch the bulb between your thumb and first finger and close the stopper located on the top of the bulb.    Check for fluid leaking from around your catheter (instead of fluid draining through your catheter). This may be a sign that the drain is no longer working correctly.   Write down the following information every time you empty your bag:   The date and time.   The amount of drainage.   Activity   Rest at home for 1-2 days after your procedure.   For the first 48 hours do not lift anything more than 10 lbs (about a gallon of milk). You may perform moderate  activities/exercise. Please avoid strenuous activities during this time.   Avoid any activities which may pull on your drain as this can cause your drain to become dislodged.   If you were given a sedative during the procedure, it can affect you for several hours. Do not drive or operate machinery until your health care provider says that it is safe.   General instructions   For mild pain take over-the-counter medications as needed for pain such as Tylenol  or Advil. If you are experiencing severe pain please call our office as this may indicate an issue with your drain.    If you were prescribed an antibiotic medicine, take it as told by your health care provider. Do not stop using the antibiotic even if you start to feel better.   You may shower 24 hours after the drain is placed. To do this cover the insertion site with a water tight material such as saran wrap and seal the edges with tape, you may also purchase waterproof dressings at your local drug store. Shower as usual and then remove the water tight dressing and any gauze/tape underneath it once you have exited the shower and dried off. Allow the area to air dry or pat dry with a clean towel. Once the skin is completely dry place a new gauze dressing. It is important to keep the site dry at all times to prevent infection.   Do not submerge the drain - this means you cannot take baths, swim, use a hot tub, etc. until the drain is removed.    Do not use any products that contain nicotine or tobacco, such as cigarettes, e-cigarettes, and chewing tobacco. If you need help quitting, ask your health care provider.   Keep all follow-up visits as told by your health care provider. This is important.   Contact a health care provider if:   You have less than 10 mL of drainage a day for 2-3 days in a row, or as directed by your health care provider.   You have any of these signs of infection:   More redness, swelling, or pain around your  incision area.   More fluid or blood coming from your incision area.   Warmth coming from your incision area.   Pus or a bad smell coming from your incision area.   You have fluid leaking from around your catheter (instead of through your catheter).   You are unable to flush the drain.   You have a fever or chills.   You have pain that does not get better with medicine.   You have not been contacted to schedule a drain follow up appointment within 10 days of discharge from the hospital.   Please call [facility] at [phone] with any  questions or concerns.   Get help right away if:   Your catheter comes out.   You suddenly stop having drainage from your catheter.   You suddenly have blood in the fluid that is draining from your catheter.   You become dizzy or you faint.   You develop a rash.   You have nausea or vomiting.   You have difficulty breathing or you feel short of breath.   You develop chest pain.   You have problems with your speech or vision.   You have trouble balancing or moving your arms or legs.   Summary   It is common to have a small amount of bruising and discomfort in the area where the drainage tube (catheter) was placed. You may also have minor discomfort with movement while the drain is in place.   Flush the drain once per day with 5 mL of 0.9% normal saline (unless you were told otherwise by your healthcare provider).    Record the amount of drainage from the bag every time you empty it.   Change the dressing every 3 days or earlier if soiled/wet. Keep the skin dry under the dressing.   You may shower with the drain in place. Do not submerge the drain (no baths, swimming, hot tubs, etc.).   Contact [facility] at [phone] if you have more redness, swelling, or pain around your incision area or if you have pain that does not get better with medicine.   This information is not intended to replace advice given  to you by your health care provider. Make sure you discuss any questions you have with your health care provider.    Interventional Radiology Drain Record   Empty your drain at least once per day. You may empty it as often as needed. Use this form to write down the amount of fluid that has collected in the drainage container. Bring this form with you to your follow-up visits. Please call [facility] at [phone] with any questions or concerns prior to your appointment.   Drain #1 location: ___________________   Date __________ Time __________ Amount __________   Date __________ Time __________ Amount __________   Date __________ Time __________ Amount __________   Date __________ Time __________ Amount __________   Date __________ Time __________ Amount __________   Date __________ Time __________ Amount __________   Date __________ Time __________ Amount __________   Date __________ Time __________ Amount __________   Date __________ Time __________ Amount __________   Date __________ Time __________ Amount __________   Date __________ Time __________ Amount __________   Date __________ Time __________ Amount __________   Date __________ Time __________ Amount __________   Date __________ Time __________ Amount __________

## 2024-07-16 NOTE — Care Management Important Message (Signed)
 Important Message  Patient Details  Name: Stephen Cervantes MRN: [id number] Date of Birth: 06/06/1941   Important Message Given:  Yes - Medicare IM     [name] W, CMA 07/16/2024, 11:39 AM

## 2024-07-16 NOTE — Progress Notes (Signed)
 Patient discharged to home with wife at bedside. Patient alert and orientated. Discharge instructions have been explained and given to wife with understanding.  Prescriptions are at pharmacy of choice. Patient IV to right and left arms have been removed. Catheter and tip are intact. Gauze and tape have been applied to area. Personal belongings have been collected. Patient dressed and transported to lobby for pick up.

## 2024-07-16 NOTE — Plan of Care (Signed)
  Problem: Education: Goal: Knowledge of General Education information will improve Description: Including pain rating scale, medication(s)/side effects and non-pharmacologic comfort measures Outcome: Progressing   Problem: Activity: Goal: Risk for activity intolerance will decrease Outcome: Adequate for Discharge   

## 2024-07-16 NOTE — Discharge Summary (Signed)
 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 male with medical history significant for BPH, duodenal ulcer (s/p EGD 05/30/2024) who is s/p exploratory laparotomy 06/07/2024 for infected mesh related to prior ventral hernia repair, being admitted with acute abdominal distention with CT showing large seroma with possible fistulous tract.  On arrival, vitals within normal limits. Labs notable for normal WBC of 8.6, normal lipase and LFTs except for slightly elevated alk phos of 186 Hemoglobin at baseline of 8.3, creatinine 1.81, slightly up from baseline of 1.34.  Urinalysis with moderate leuks and rare bacteria. CT abdomen and pelvis with contrast showed a massive central abdominal wall fluid collection likely related to hernia repair with concern for fistulous connection with recommendation for repeat CT AP with oral contrast. General surgery and IR were consulted. IR placed drain in RLQ on 11/8 and he has been started on empiric Abx. Clinically he recovered very well and diet was gradually advanced throughout admission until wound cultures completed.  Empericially was treated with zosyn and vancomycin .   He was transitioned to cipro  and flagyl per culture sensitivities to complete 14 days Abx course and discharged in stable condition.  His drain remained in place at dc and he and spouse were given care instructions as well as directed to follow up with radiology for removal.   All other chronic conditions were treated with home medications.    Discharge Condition: Good, improved Recommended discharge diet: Regular healthy diet  Consultations: IR General surgery   Procedures/Studies: RUQ drain 11/8 Ex lap with revision/debridement of his infected ventral hernia  Allergies as of 07/16/2024       Reactions   Morphine And Codeine Other (See Comments)   Severe hallucinations   Tape Other (See Comments)   Silk Tape per patient peels off my skin        Medication List     STOP taking these medications    celecoxib  200 MG capsule Commonly known as: CELEBREX        TAKE these medications    acetaminophen  325 MG tablet Commonly known as: TYLENOL  Take 2 tablets (650 mg total) by mouth every 6 (six) hours as needed for mild pain (pain score 1-3), fever or moderate pain (pain score 4-6) (or Fever >/= 101).   atorvastatin  10 MG tablet Commonly known as: LIPITOR Take 10 mg by mouth daily.   ciprofloxacin  500 MG tablet Commonly known as: Cipro  Take 1 tablet (500 mg total) by mouth 2 (two) times daily for 11 days.   ferrous sulfate 325 (65 FE) MG tablet Take 325 mg by mouth daily with breakfast.   finasteride  5 MG tablet Commonly known as: PROSCAR  TAKE ONE TABLET EVERY DAY   levothyroxine  125 MCG tablet Commonly known as: SYNTHROID  Take 125 mcg  by mouth daily.   loperamide  2 MG capsule Commonly known as: IMODIUM  Take 6 mg by mouth in the morning, at noon, and at bedtime.   losartan 50 MG tablet Commonly known as: COZAAR Take 50 mg by mouth daily.   metroNIDAZOLE 500 MG tablet Commonly known as: FLAGYL Take 1 tablet (500 mg total) by mouth 2 (two) times daily  for 11 days.   montelukast  10 MG tablet Commonly known as: SINGULAIR  Take 1 tablet by mouth daily.   Multi-Vitamin tablet Take 1 tablet by mouth daily.   ondansetron  4 MG disintegrating tablet Commonly known as: ZOFRAN -ODT Take 1 tablet (4 mg total) by mouth every 8 (eight) hours as needed for nausea or vomiting.   oxyCODONE  5 MG immediate release tablet Commonly known as: Oxy IR/ROXICODONE  Take 1-2 tablets (5-10 mg total) by mouth every 6 (six) hours as needed for severe pain (pain score 7-10) or breakthrough pain.   potassium citrate  10 MEQ (1080 MG) SR tablet Commonly known as: UROCIT-K  Take 10 mEq by mouth 4 (four) times daily.   sodium chloride  flush 0.9 % Soln injection 10 mLs by Intracatheter route daily.   sucralfate  1 g tablet Commonly known as: CARAFATE  Take 1 g by mouth in the morning, at noon, and at bedtime.   tamsulosin  0.4 MG Caps capsule Commonly known as: FLOMAX  TAKE 1 CAPSULE BY MOUTH ONCE DAILY        Follow-up Information     [facility] Follow up.   Why: Please follow up with IR in 10-14 days. A scheduler from our clinic will call you with a date/time of your appointment. Please call our office with any questions/concerns prior to your appointment. Contact information: [address] [place] [address] [phone]                 Subjective   Pt reports feeling well. Denies significant abdominal pain. Feels ready to go home. Tolerating diet. Passing gas.   All questions and concerns were addressed at time of discharge.  Objective  Blood pressure (!) 157/97, pulse 70, temperature 98.3 F (36.8 C), resp. rate 16, height 5' 9 (1.753 m), weight 81.3 kg, SpO2 96%.   General: Pt is alert, awake, not in acute distress Cardiovascular: RRR, S1/S2 +, no rubs, no gallops Respiratory: CTA bilaterally, no wheezing, no rhonchi Abdominal: Soft, NT, ND, bowel sounds +. Ventral wound healing well with clean, dry  dressing. RUQ drain with serosanguinous fluid draining. No signs of local superficial infection or leakage around drain.  Extremities: no edema, no cyanosis  The results of significant diagnostics from this hospitalization (including imaging, microbiology, ancillary and laboratory) are listed below for reference.   Imaging studies: CT GUIDED PERITONEAL/RETROPERITONEAL FLUID DRAIN BY [facility] CATH Result Date: 07/13/2024 INDICATION: Large intra-abdominal abscess collection. EXAM: CT-guided drain placement TECHNIQUE: Multidetector CT imaging of the abdomen was performed following the standard protocol without IV contrast. RADIATION DOSE REDUCTION: This exam was performed according to the departmental dose-optimization program which includes automated exposure control, adjustment of the mA and/or kV according to patient size and/or use of iterative reconstruction technique. MEDICATIONS: The patient is currently admitted to the hospital and receiving intravenous antibiotics. The antibiotics were administered within an appropriate time frame prior to the initiation of the procedure. ANESTHESIA/SEDATION: 2 mg Versed were administered intravenously by the Radiology nurse for anxiolysis. This does not constitute moderate sedation. COMPLICATIONS: None immediate. PROCEDURE: Informed written consent was obtained from the patient after a thorough  discussion of the procedural risks, benefits and alternatives. All questions were addressed. Maximal Sterile Barrier Technique was utilized including caps, mask, sterile gowns, sterile gloves, sterile drape, hand hygiene and skin antiseptic. A timeout was performed prior to the initiation of the procedure. A scanning CT scan was performed. The large fluid and gas collection is identified. A suitable skin entry site from a right lower quadrant approach was selected and marked. Local anesthesia was attained by infiltration with 1% lidocaine . A small dermatotomy was made. Under  intermittent CT guidance, an 18 gauge trocar needle was advanced into the collection. A 0.035 wire was then advanced into the collection. The needle was removed. The percutaneous tract was dilated to 12 French. A skater 12 French biliary drainage catheter with multiple sideholes was advanced over the wire and formed. Aspiration was then performed evacuated in a total of 2200 mL frankly purulent fluid. The drainage catheter was then flushed. CT imaging demonstrates a collapsed abscess cavity. The catheter was secured to the skin with 0 Prolene suture. The patient tolerated the procedure well. IMPRESSION: Successful placement of a 23 French biliary drainage catheter via a right lower quadrant approach with evacuation of 2200 mL frankly purulent fluid. Samples were sent for Gram stain and culture. Electronically Signed   By: Wilkie Lent M.D.   On: 07/13/2024 18:40   CT ABDOMEN PELVIS WO CONTRAST Result Date: 07/13/2024 EXAM: CT ABDOMEN AND PELVIS WITHOUT CONTRAST 07/13/2024 01:52:43 AM TECHNIQUE: CT of the abdomen and pelvis was performed without the administration of intravenous contrast. Multiplanar reformatted images are provided for review. Automated exposure control, iterative reconstruction, and/or weight-based adjustment of the mA/kV was utilized to reduce the radiation dose to as low as reasonably achievable. COMPARISON: CT abdomen and pelvis 07/12/2024. CLINICAL HISTORY: eval seroma vs fistula eval seroma vs fistula FINDINGS: LOWER CHEST: No acute abnormality. LIVER: The liver is unremarkable. GALLBLADDER AND BILE DUCTS: Cholecystectomy. No biliary ductal dilatation. SPLEEN: No acute abnormality. PANCREAS: No acute abnormality. ADRENAL GLANDS: No acute abnormality. KIDNEYS, URETERS AND BLADDER: No stones in the kidneys or ureters. No hydronephrosis. No perinephric or periureteral stranding. Urinary bladder is unremarkable. GI AND BOWEL: Stomach demonstrates no acute abnormality. There is no bowel  obstruction. No evidence of extraluminal oral contrast within the fluid and gas collection. Postoperative change of colectomy with ileorectal anastomosis. Small bowel anastomosis in the left lower quadrant. PERITONEUM AND RETROPERITONEUM: No ascites. No free air. VASCULATURE: Aorta is normal in caliber. LYMPH NODES: No lymphadenopathy. REPRODUCTIVE ORGANS: No acute abnormality. BONES AND SOFT TISSUES: Posterior lumbar fusion. Right hip arthroplasty. Recent postoperative change of ventral abdominal hernia repair. The large fluid and gas collection in the anterior abdominal wall is unchanged. IMPRESSION: 1. Large fluid and gas collection in the anterior abdominal wall. No evidence of extraluminal oral contrast within the collection. Electronically signed by: Norman Gatlin MD 07/13/2024 02:00 AM EST RP Workstation: [id number]   CT ABDOMEN PELVIS W CONTRAST Result Date: 07/12/2024 EXAM: CT ABDOMEN AND PELVIS WITH CONTRAST 07/12/2024 09:50:45 PM TECHNIQUE: CT of the abdomen and pelvis was performed with the administration of intravenous contrast. Multiplanar reformatted images are provided for review. Automated exposure control, iterative reconstruction, and/or weight-based adjustment of the mA/kV was utilized to reduce the radiation dose to as low as reasonably achievable. COMPARISON: CT 06/05/2024 CLINICAL HISTORY: Bowel obstruction suspected. FINDINGS: LOWER CHEST: Bibasilar atelectasis/scarring. No active pulmonary identified. LIVER: The liver is unremarkable. GALLBLADDER AND BILE DUCTS: Cholecystectomy. No biliary ductal dilatation. SPLEEN: No acute abnormality. PANCREAS: No acute abnormality.  ADRENAL GLANDS: No acute abnormality. KIDNEYS, URETERS AND BLADDER: Nonobstructing bilateral nephrolithiasis. No hydronephrosis. No perinephric or periureteral stranding. Urinary bladder is unremarkable. GI AND BOWEL: Stomach demonstrates no acute abnormality. Postoperative change of small bowel resection with anastomosis  in the left lower quadrant. Postoperative change of colectomy with ileal rectal anastomosis. There is no bowel obstruction. PERITONEUM AND RETROPERITONEUM: Postoperative change of ventral abdominal hernia repair. VASCULATURE: Aorta is normal in caliber. LYMPH NODES: No lymphadenopathy. REPRODUCTIVE ORGANS: No acute abnormality. BONES AND SOFT TISSUES: Posterior fusion in the lumbar spine. Right hip arthroplasty. Massive gas and fluid collection in the central abdominal wall measuring 23.6 x 10.0 x 26.9 cm (ML by AP by CC). This is likely postoperative related to the abdominal wall defect repair on 06/07/2024. Sterility is not determined by imaging. There is mild adjacent stranding in the central abdominal wall musculature and subcutaneous fat. Given the gas within the collection and previous concern for subtle increased density within the collection due to oral contrast extravasation, a fistulous connection with the abutting small bowel is suspected and not definitively identified. No acute osseous abnormality. IMPRESSION: 1. Massive central abdominal wall fluid collection (23.6 x 10.0 x 26.9 cm), likely related to hernia repair. Given gas within the collection and prior concern for oral contrast extravasation, a fistulous connection with the abutting small bowel is suspected but not clearly identified. CT abdomen and pelvis with oral contrast to evaluate for small bowel fistula. Electronically signed by: Norman Gatlin MD 07/12/2024 10:08 PM EST RP Workstation: [id number]   Cystogram Result Date: 06/28/2024 CLINICAL DATA: 83 year old male with recent history of abdominal wall repair and infected mesh removal on 06/07/2024 complicated by bladder injury with primary repair and Foley catheter placement. Patient presents for cystogram to evaluate for persistent bladder leak/injury followed by hopeful Foley catheter removal in Urology today. EXAM: CYSTOGRAM TECHNIQUE: After catheterization of the urinary bladder  following sterile technique the bladder was filled with 225 mL Cysto-Hypaque 30% by drip infusion. Serial spot images were obtained during bladder filling and post draining. FLUOROSCOPY: Radiation Exposure Index (as provided by the fluoroscopic device): 36.8 mGy Kerma COMPARISON:  None Available. FINDINGS: No evidence of contrast leak or extravasation. The bladder is well distended. Bilateral outpouchings are present consistent with the presence of bladder diverticula. IMPRESSION: No evidence of contrast extravasation to suggest the presence of a bladder leak. Small bilateral diverticula versus trace reflux into the proximal ureters. Electronically Signed   By: Wilkie Lent M.D.   On: 06/28/2024 11:39    Labs: Basic Metabolic Panel: Recent Labs  Lab 07/12/24 1626 07/14/24 0818 07/15/24 0722  NA 136 134* 136  K 4.7 3.8 3.7  CL 99 103 104  CO2 23 21* 22  GLUCOSE 114* 92 108*  BUN 29* 25* 19  CREATININE 1.81* 1.40* 1.17  CALCIUM  9.6 7.8* 8.2*   CBC: Recent Labs  Lab 07/12/24 1626 07/14/24 0818 07/14/24 1502 07/15/24 0722  WBC 8.6 5.8  --  6.1  HGB 8.3* 6.8* 8.9* 8.8*  HCT 26.6* 21.8* 27.8* 28.1*  MCV 82.9 81.0  --  81.7  PLT 334 248  --  299   Microbiology: Results for orders placed or performed during the hospital encounter of 07/12/24  Urine Culture     Status: Abnormal   Collection Time: 07/12/24  4:26 PM   Specimen: Urine, Clean Catch  Result Value Ref Range Status   Specimen Description   Final    URINE, CLEAN CATCH Performed at [facility], [address] [place], [place] [address]    Special Requests   Final    NONE Performed at [facility], [address] [place], [place] [address]    Culture 20,000 COLONIES/mL YEAST (A)  Final   Report Status 07/14/2024 FINAL  Final  Aerobic/Anaerobic Culture w Gram Stain (surgical/deep wound)     Status: None   Collection Time: 07/13/24  6:03 PM   Specimen: Abscess  Result Value Ref Range Status    Specimen Description   Final    ABSCESS Performed at [facility], [address], [place], [place] [address]    Special Requests   Final    NONE Performed at [facility], [address], [place], [place] [address]    Gram Stain   Final    FEW WBC PRESENT,BOTH PMN AND MONONUCLEAR FEW GRAM POSITIVE COCCI FEW GRAM NEGATIVE RODS Performed at [facility], [address], [place], [place] [address]    Culture   Final    MODERATE CITROBACTER SPECIES MODERATE KLEBSIELLA AEROGENES MIXED ANAEROBIC FLORA PRESENT.  CALL LAB IF FURTHER IID REQUIRED.    Report Status 07/16/2024 FINAL  Final   Organism ID, Bacteria CITROBACTER SPECIES  Final   Organism ID, Bacteria KLEBSIELLA AEROGENES  Final      Susceptibility   Citrobacter species - MIC*    CEFEPIME  <=0.12 SENSITIVE Sensitive     ERTAPENEM <=0.12 SENSITIVE Sensitive     CEFTRIAXONE  <=0.25 SENSITIVE Sensitive     CIPROFLOXACIN  <=0.06 SENSITIVE Sensitive     GENTAMICIN <=1 SENSITIVE Sensitive     MEROPENEM <=0.25 SENSITIVE Sensitive     TRIMETH /SULFA  <=20 SENSITIVE Sensitive     PIP/TAZO Value in next row Sensitive      <=4 SENSITIVEThis is a modified FDA-approved test that has been validated and its performance characteristics determined by the reporting laboratory.  This laboratory is certified under the Clinical Laboratory Improvement Amendments CLIA as qualified to perform high complexity clinical laboratory testing.    * MODERATE CITROBACTER SPECIES   Klebsiella aerogenes - MIC*    CEFEPIME  Value in next row Sensitive      <=4 SENSITIVEThis is a modified FDA-approved test that has been validated and its performance characteristics determined by the reporting laboratory.  This laboratory is certified under the Clinical Laboratory Improvement Amendments CLIA as qualified to perform high complexity clinical laboratory testing.    CEFTRIAXONE  Value in next row Resistant      <=4 SENSITIVEThis is a modified  FDA-approved test that has been validated and its performance characteristics determined by the reporting laboratory.  This laboratory is certified under the Clinical Laboratory Improvement Amendments CLIA as qualified to perform high complexity clinical laboratory testing.    CIPROFLOXACIN  Value in next row Sensitive      <=4 SENSITIVEThis is a modified FDA-approved test that has been validated and its performance characteristics determined by the reporting laboratory.  This laboratory is certified under the Clinical Laboratory Improvement Amendments CLIA as qualified to perform high complexity clinical laboratory testing.    GENTAMICIN Value in next row Sensitive      <=4 SENSITIVEThis is a modified FDA-approved test that has been validated and its performance characteristics determined by the reporting laboratory.  This laboratory is certified under the Clinical Laboratory Improvement Amendments CLIA as qualified to perform high complexity clinical laboratory testing.    MEROPENEM Value in next row Sensitive      <=4 SENSITIVEThis is a modified FDA-approved test that has  been validated and its performance characteristics determined by the reporting laboratory.  This laboratory is certified under the Clinical Laboratory Improvement Amendments CLIA as qualified to perform high complexity clinical laboratory testing.    TRIMETH /SULFA  Value in next row Sensitive      <=4 SENSITIVEThis is a modified FDA-approved test that has been validated and its performance characteristics determined by the reporting laboratory.  This laboratory is certified under the Clinical Laboratory Improvement Amendments CLIA as qualified to perform high complexity clinical laboratory testing.    PIP/TAZO Value in next row Resistant      >=128 RESISTANTThis is a modified FDA-approved test that has been validated and its performance characteristics determined by the reporting laboratory.  This laboratory is certified under the  Clinical Laboratory Improvement Amendments CLIA as qualified to perform high complexity clinical laboratory testing.    * MODERATE KLEBSIELLA AEROGENES    Time coordinating discharge: Over 30 minutes  Marien LITTIE Piety, MD  [facility] 07/16/2024, 1:47 PM

## 2024-07-16 NOTE — Plan of Care (Signed)
  Problem: Skin Integrity: Goal: Risk for impaired skin integrity will decrease Outcome: Progressing   Problem: Safety: Goal: Ability to remain free from injury will improve Outcome: Progressing   Problem: Nutrition: Goal: Adequate nutrition will be maintained Outcome: Progressing   Problem: Pain Managment: Goal: General experience of comfort will improve and/or be controlled Outcome: Progressing

## 2024-07-16 NOTE — Progress Notes (Signed)
 Referring Physician(s): Dr. Jordis  Supervising Physician: Vanice Revel  Patient Status:  [facility] - In-pt  Chief Complaint: Abdominal wall fluid collection s/p drain placement in IR 07/13/24  Subjective: Patient resting in bed, awake and alert. His wife is in the room. He is tentatively being discharged from the hospital today.   Allergies: Morphine and codeine and Tape  Medications: Prior to Admission medications   Medication Sig Start Date End Date Taking? Authorizing Provider  ciprofloxacin  (CIPRO ) 500 MG tablet Take 1 tablet (500 mg total) by mouth 2 (two) times daily for 11 days. 07/16/24 07/27/24 Yes Lenon Marien CROME, MD  sodium chloride  flush 0.9 % SOLN injection 10 mLs by Intracatheter route daily. 07/16/24  Yes McInnis, [name], PA-C  acetaminophen  (TYLENOL ) 325 MG tablet Take 2 tablets (650 mg total) by mouth every 6 (six) hours as needed for mild pain (pain score 1-3), fever or moderate pain (pain score 4-6) (or Fever >/= 101). 07/16/24 08/15/24  Lenon Marien CROME, MD  atorvastatin  (LIPITOR) 10 MG tablet Take 10 mg by mouth daily.     [provider]  celecoxib  (CELEBREX ) 200 MG capsule Take 1 capsule (200 mg total) by mouth daily. 03/13/23   Hilma Hastings, PA-C  ferrous sulfate 325 (65 FE) MG tablet Take 325 mg by mouth daily with breakfast.    [provider]  finasteride  (PROSCAR ) 5 MG tablet TAKE ONE TABLET EVERY DAY 01/12/24   McGowan, Clotilda A, PA-C  levothyroxine  (SYNTHROID ) 125 MCG tablet Take 125 mcg by mouth daily. 03/05/24   [provider]  loperamide  (IMODIUM ) 2 MG capsule Take 6 mg by mouth in the morning, at noon, and at bedtime.    [provider]  losartan (COZAAR) 50 MG tablet Take 50 mg by mouth daily. 03/05/24   [provider]  montelukast  (SINGULAIR ) 10 MG tablet Take 1 tablet by mouth daily. 06/16/23   [provider]  Multiple Vitamin (MULTI-VITAMIN) tablet Take 1 tablet by mouth daily.    [provider]  ondansetron  (ZOFRAN -ODT) 4 MG disintegrating tablet Take 1 tablet (4 mg total) by mouth every 8 (eight) hours as needed for nausea or vomiting. 07/12/24   Pabon, Diego F, MD  oxyCODONE  (OXY IR/ROXICODONE ) 5 MG immediate release tablet Take 1-2 tablets (5-10 mg total) by mouth every 6 (six) hours as needed for severe pain (pain score 7-10) or breakthrough pain. 06/14/24   [name], [name], PA-C  potassium citrate  (UROCIT-K ) 10 MEQ (1080 MG) SR tablet Take 10 mEq by mouth 4 (four) times daily. 05/01/24   [provider]  sucralfate  (CARAFATE ) 1 g tablet Take 1 g by mouth in the morning, at noon, and at bedtime.    [provider]  tamsulosin  (FLOMAX ) 0.4 MG CAPS capsule TAKE 1 CAPSULE BY MOUTH ONCE DAILY 01/12/24   McGowan, Clotilda A, PA-C     Vital Signs: BP (!) 157/97 (BP Location: Left Arm)   Pulse 70   Temp 98.3 F (36.8 C)   Resp 16   Ht 5' 9 (1.753 m)   Wt 179 lb 3.7 oz (81.3 kg)   SpO2 96%   BMI 26.47 kg/m   Physical Exam Constitutional:      General: He is not in acute distress.    Appearance: He is not ill-appearing.  Cardiovascular:     Rate and Rhythm: Normal rate.  Abdominal:     Palpations: Abdomen is soft.     Comments: RLQ drain to gravity.  Approximately 50 ml of serous fluid in gravity bad. Skin insertion site is clean/dry. Suture intact. Drain easily flushed with 5 ml normal saline. Mild tenderness around the drain site.   Skin:    General: Skin is warm and dry.  Neurological:     Mental Status: He is alert and oriented to person, place, and time.     Imaging:   Labs:  CBC: Recent Labs    06/14/24 0752 07/12/24 1626 07/14/24 0818 07/14/24 1502 07/15/24 0722  WBC 13.8* 8.6 5.8  --  6.1  HGB 8.5* 8.3* 6.8* 8.9* 8.8*  HCT 25.9* 26.6* 21.8* 27.8* 28.1*  PLT 214 334 248  --  299    COAGS: Recent Labs    06/05/24 1851 07/13/24 0839  INR 1.2 1.4*    BMP: Recent Labs    06/12/24 0527 07/12/24 1626  07/14/24 0818 07/15/24 0722  NA 138 136 134* 136  K 3.6 4.7 3.8 3.7  CL 111 99 103 104  CO2 22 23 21* 22  GLUCOSE 114* 114* 92 108*  BUN 25* 29* 25* 19  CALCIUM  8.1* 9.6 7.8* 8.2*  CREATININE 1.34* 1.81* 1.40* 1.17  GFRNONAA 53* 37* 50* >60    LIVER FUNCTION TESTS: Recent Labs    03/24/24 1054 03/29/24 1504 06/05/24 1851 07/12/24 1626  BILITOT 2.2* 0.6 0.8 0.8  AST 28 50* 24 24  ALT 30 50* 14 13  ALKPHOS 223* 378* 150* 186*  PROT 7.3 7.1 7.3 7.7  ALBUMIN 3.3* 3.1* 3.2* 2.7*    Assessment and Plan:  Abdominal wall fluid collection s/p drain placement in IR 07/13/24  Drain Location: RLQ Size: Fr size: 12 Fr Date of placement: 07/13/24  Currently to: Drain collection device: gravity 24 hour output:  Output by Drain (mL) 07/14/24 0700 - 07/14/24 1459 07/14/24 1500 - 07/14/24 2259 07/14/24 2300 - 07/15/24 0659 07/15/24 0700 - 07/15/24 1459 07/15/24 1500 - 07/15/24 2259 07/15/24 2300 - 07/16/24 0659 07/16/24 0700 - 07/16/24 1123  Closed System Drain 1 RLQ Other (Comment) 12 Fr.  225 75  100 75     Interval imaging/drain manipulation:  None   Current examination: Flushes/aspirates easily.  Insertion site unremarkable. Suture in place. Dressed appropriately.   Drain care and drain follow up discussed with the patient and his wife. We discussed flushing the drain at least once daily with 5 ml NS, emptying the bag daily and documenting the output and keeping the site clean and dry. We discussed changing the dressing daily or as needed. The patient and his wife were shown how to flush the drain and how to empty the gravity bag. I gave the patient 5 saline flushes which should last them 10 days. A new dressing was placed over the site. They know that a scheduler from our clinic will call them with a date/time of their appointment. IR follow up/contact information will be on the discharge paperwork.    Electronically Signed: Warren Dais, AGACNP-BC 07/16/2024, 11:21  AM   I spent a total of 25 Minutes at the the patient's bedside AND on the patient's hospital floor or unit, greater than 50% of which was counseling/coordinating care for abdominal wall abscess drain.

## 2024-07-18 ENCOUNTER — Other Ambulatory Visit: Payer: Self-pay

## 2024-07-19 ENCOUNTER — Ambulatory Visit (INDEPENDENT_AMBULATORY_CARE_PROVIDER_SITE_OTHER): Admitting: Surgery

## 2024-07-19 ENCOUNTER — Ambulatory Visit

## 2024-07-19 ENCOUNTER — Telehealth: Payer: Self-pay | Admitting: Urology

## 2024-07-19 ENCOUNTER — Encounter: Payer: Self-pay | Admitting: Surgery

## 2024-07-19 VITALS — BP 174/86 | HR 76 | Temp 97.9°F | Ht 69.0 in | Wt 167.6 lb

## 2024-07-19 DIAGNOSIS — S3011XD Contusion of abdominal wall, subsequent encounter: Secondary | ICD-10-CM

## 2024-07-19 DIAGNOSIS — L02211 Cutaneous abscess of abdominal wall: Secondary | ICD-10-CM

## 2024-07-19 DIAGNOSIS — Z8719 Personal history of other diseases of the digestive system: Secondary | ICD-10-CM

## 2024-07-19 DIAGNOSIS — K8020 Calculus of gallbladder without cholecystitis without obstruction: Secondary | ICD-10-CM

## 2024-07-19 NOTE — Telephone Encounter (Signed)
 Patient called regarding CT that is scheduled for 07/23/24, and he also has appt scheduled with Clotilda Cornwall for 07/26/24. He said he just got out of the hospital recently, and his surgeon had told him that he might want to wait before having this done. He also said that he had a recent CT, and his doctor said we might want to review that. Patient would like to know what Clotilda would like. Please call patient to advise if he should cancel CT on 07/23/24, and how to proceed.

## 2024-07-19 NOTE — Progress Notes (Signed)
 07/19/2024  History of Present Illness: Stephen Cervantes is a 83 y.o. male status post exploratory laparotomy, extensive lysis of adhesions, excision of infected mesh, debridement of midline skin wound/abscess cavity, small bowel resection with anastomosis, open cholecystectomy, bladder repair, recurrent ventral hernia repair with bilateral myocutaneous flaps with anterior release on 06/07/2024.   Patient was admitted on 07/12/24 with nausea, worsening abdominal pain and distention, and had a CT scan showing a very large fluid collection in the abdominal wall.  He had a percutaneous drain placed by IR on 07/13/24 which drained 2 L of purulent fluid.  Cultures grew Citrobacter and Klebsiella, and was discharged home on 07/16/24 on Cipro  and Flagyl.    He reports that his symptoms have improved.  He has been having some residual nausea and also reports diffuse itching, but no rash.  His drain is having about 5-6 ounces of serous fluid output per day.   Past Medical History: Past Medical History:  Diagnosis Date   Arthritis    Asthma    Blood transfusion without reported diagnosis    Bowel obstruction (HCC) 7988,7984   Depression    Diverticulitis 2006   Enlarged prostate    Gallstones 07/04/2014   History of gastrointestinal ulcer    History of kidney stones    HLD (hyperlipidemia)    HTN (hypertension)    Kidney stone    kidney stones   Neuritis or radiculitis due to rupture of lumbar intervertebral disc 05/29/2015   Thyroid disease      Past Surgical History: Past Surgical History:  Procedure Laterality Date   ABDOMINAL WALL DEFECT REPAIR N/A 06/07/2024   Procedure: REPAIR, ABDOMINAL WALL;  Surgeon: Desiderio Schanz, MD;  Location: [facility] ORS;  Service: General;  Laterality: N/A;   APPENDECTOMY     BOWEL RESECTION  06/07/2024   Procedure: EXCISION, SMALL INTESTINE;  Surgeon: Desiderio Schanz, MD;  Location: [facility] ORS;  Service: General;;   CHOLECYSTECTOMY N/A 06/07/2024   Procedure:  CHOLECYSTECTOMY;  Surgeon: Desiderio Schanz, MD;  Location: [facility] ORS;  Service: General;  Laterality: N/A;   COLECTOMY  09/05/2004   Abdominal colectomy, ileostomy, Hartman procedure for perforation of cecum, sigmoid diverticulitis   ESOPHAGOGASTRODUODENOSCOPY N/A 05/30/2024   Procedure: EGD (ESOPHAGOGASTRODUODENOSCOPY);  Surgeon: [name], MD;  Location: [facility] ENDOSCOPY;  Service: Gastroenterology;  Laterality: N/A;   EXCISION OF MESH N/A 06/07/2024   Procedure: REMOVAL, MESH, ABDOMEN OR PELVIS;  Surgeon: Desiderio Schanz, MD;  Location: [facility] ORS;  Service: General;  Laterality: N/A;   HERNIA REPAIR  2008?   [facility]   ILEOSTOMY  09/05/2005   JOINT REPLACEMENT  09/05/2012   Hip Replacement [name]   KNEE SURGERY Right 09/05/2009   POSTERIOR LUMBAR FUSION 4 LEVEL N/A 03/13/2019   Procedure: POSTERIOR LUMBAR FUSION 4 LEVEL L1-5;  Surgeon: Clois Fret, MD;  Location: [facility] ORS;  Service: Neurosurgery;  Laterality: N/A;   TRIGGER FINGER RELEASE Right 05/05/2021   Procedure: RELEASE TRIGGER FINGER/A-1 PULLEY;  Surgeon: Edie Norleen PARAS, MD;  Location: [facility] ORS;  Service: Orthopedics;  Laterality: Right;    Home Medications: Prior to Admission medications   Medication Sig Start Date End Date Taking? Authorizing Provider  atorvastatin  (LIPITOR) 10 MG tablet Take 10 mg by mouth daily.     [provider]  celecoxib  (CELEBREX ) 200 MG capsule Take 1 capsule (200 mg total) by mouth daily. 03/13/23   Hilma Hastings, PA-C  ferrous sulfate 325 (65 FE) MG tablet Take 325 mg by  mouth daily with breakfast.    [provider]  finasteride  (PROSCAR ) 5 MG tablet TAKE ONE TABLET EVERY DAY 01/12/24   McGowan, Clotilda A, PA-C  levothyroxine  (SYNTHROID ) 125 MCG tablet Take 125 mcg by mouth daily. 03/05/24   [provider]  loperamide  (IMODIUM ) 2 MG capsule Take 6 mg by mouth in the morning, at noon, and at bedtime.    [provider]  losartan  (COZAAR) 50 MG tablet Take 50 mg by mouth daily. 03/05/24   [provider]  montelukast  (SINGULAIR ) 10 MG tablet Take 1 tablet by mouth daily. 06/16/23   [provider]  Multiple Vitamin (MULTI-VITAMIN) tablet Take 1 tablet by mouth daily.    [provider]  oxyCODONE  (OXY IR/ROXICODONE ) 5 MG immediate release tablet Take 1-2 tablets (5-10 mg total) by mouth every 6 (six) hours as needed for severe pain (pain score 7-10) or breakthrough pain. 06/14/24   [name], [name], PA-C  pantoprazole  (PROTONIX ) 40 MG tablet Take 1 tablet (40 mg total) by mouth 2 (two) times daily before a meal for 14 days. 03/24/24 06/06/24  [name], MD  potassium citrate  (UROCIT-K ) 10 MEQ (1080 MG) SR tablet Take 10 mEq by mouth 4 (four) times daily. 05/01/24   [provider]  sucralfate  (CARAFATE ) 1 g tablet Take 1 g by mouth in the morning, at noon, and at bedtime.    [provider]  tamsulosin  (FLOMAX ) 0.4 MG CAPS capsule TAKE 1 CAPSULE BY MOUTH ONCE DAILY 01/12/24   Helon Clotilda A, PA-C    Allergies: Allergies  Allergen Reactions   Morphine And Codeine Other (See Comments)    Severe hallucinations   Tape Other (See Comments)    Silk Tape per patient peels off my skin    Review of Systems: Review of Systems  Constitutional:  Negative for chills and fever.  Respiratory:  Negative for shortness of breath.   Cardiovascular:  Negative for chest pain.  Gastrointestinal:  Positive for abdominal pain and nausea. Negative for diarrhea and vomiting.    Physical Exam BP (!) 174/86   Pulse 76   Temp 97.9 F (36.6 C) (Oral)   Ht 5' 9 (1.753 m)   Wt 167 lb 9.6 oz (76 kg)   SpO2 98%   BMI 24.75 kg/m  CONSTITUTIONAL: No acute distress HEENT:  Normocephalic, atraumatic, extraocular motion intact. RESPIRATORY:  Normal respiratory effort without pathologic use of accessory muscles. CARDIOVASCULAR: Regular rhythm and rate GI: The abdomen is soft, nondistended,  appropriately tender to palpation.  The midline incision continues to heal well and there's only small superficial openings in the center aspect of the midline incision.  He's getting dry gauze dressing changes.  The drain in right lower quadrant has serous fluid in the bag.  The drain was flushed at bedside without difficulty. MUSCULOSKELETAL: Normal gait using a cane, no peripheral edema. NEUROLOGIC:  Motor and sensation is grossly normal.  Cranial nerves are grossly intact. PSYCH:  Alert and oriented to person, place and time. Affect is normal.   Assessment and Plan: This is a 83 y.o. male status post exploratory laparotomy, extensive lysis of adhesions, excision of infected mesh, debridement of midline skin wound/abscess cavity, small bowel resection with anastomosis, open cholecystectomy, bladder repair, recurrent ventral hernia repair with bilateral myocutaneous flaps with anterior release  --The patient had a setback requiring readmission on 07/12/24 with an infected seroma of the abdominal wall.  The drain is working well so far and the output has  become serous.  The patient has an IR Eval and Manage order and discussed with him that IR will contact him to schedule further imaging, likely repeat CT scan and possible drain study.  Will defer to IR about when to remove the drain. --Discussed with the patient that the mesh that was used is a dissolvable mesh, so this would not need to be excised like his old mesh needed. --I suspect the nausea and itching are related to antibiotics.  Based on culture data, would rather keep him on these antibiotics if possible.  There's no rash or hives.  Recommended he can take Benadryl to help with the itching. --Follow up with me in about 3 weeks.  I spent 20 minutes dedicated to the care of this patient on the date of this encounter to include pre-visit review of records, face-to-face time with the patient discussing diagnosis and management, and any post-visit  coordination of care.   Aloysius Sheree Plant, MD [facility]

## 2024-07-19 NOTE — Patient Instructions (Addendum)
 You may get Benadryl to help with the itching.   Itchy, Irritated Skin (Eczema): What to Know Eczema is a group of skin conditions that cause rough and inflamed skin. There are different types of eczema. They each have different triggers, symptoms, and treatments. Eczema is not contagious, so it doesn't spread from person to person. It can appear on different parts of your body at different times. It doesn't look the same on everyone. What are the causes? The exact cause of eczema isn't known. Things that can make it worse includes: Irritants. Allergens. What are the signs or symptoms? Symptoms depend on the type of eczema you have. They can range from mild to very bad. Symptoms often include: Itchiness. Dry skin. Rash or skin bumps. Swelling. Crusty, flaky, or scaly patches. Thick patches of skin. Oozing skin or blisters. How is this diagnosed? This condition may be diagnosed based on: Symptoms. Physical exam. Medical history. Skin patch tests that use allergen patches on your back to check for allergic reactions. You may need to see a skin specialist called a dermatologist. This specialist can help diagnose and treat this condition. How is this treated? There's no cure for eczema, but you can manage your symptoms. Treatment depends on the type of eczema you have. Options may include: Medicines to lessen itching (antihistamines). Medicine to put on your skin to lessen swelling and irritation (corticosteroid creams or ointments). Light therapy, also called phototherapy. The affected skin is put under ultraviolet (UV) light. Medicines may be prescribed or purchased at the store. This will depend on the strength that's needed. Follow these instructions at home: Skin Care Use skin creams or lotions as told. Do not scratch your skin. This can make your rash worse. Keep fingernails short to avoid scratching open the skin. General instructions Take or apply your medicines as  told. Avoid triggers and allergens. Treat symptoms fast if you have a flare-up. Keep all follow-up visits to make sure your treatment plan is working. Where to find more information American Academy of Dermatology: marketingsheets.si National Eczema Association: nationaleczema.org The Society for Pediatric Dermatology: pedsderm.net Contact a health care provider if: You have very bad itching even with treatment. You often scratch your skin until it bleeds. Your rash looks different than normal. You have a fever. You have symptoms that don't go away with treatment. You have more redness, pain, or swelling over the affected skin. You have warmth or pus coming from the affected skin. This information is not intended to replace advice given to you by your health care provider. Make sure you discuss any questions you have with your health care provider. Document Revised: 01/24/2023 Document Reviewed: 01/24/2023 Elsevier Patient Education  2024 [employer].

## 2024-07-19 NOTE — Telephone Encounter (Signed)
Called patient and patient understood

## 2024-07-22 ENCOUNTER — Other Ambulatory Visit: Payer: Self-pay

## 2024-07-22 ENCOUNTER — Encounter: Payer: Self-pay | Admitting: Surgery

## 2024-07-23 ENCOUNTER — Ambulatory Visit: Admission: RE | Admit: 2024-07-23 | Source: Ambulatory Visit

## 2024-07-23 ENCOUNTER — Other Ambulatory Visit (HOSPITAL_COMMUNITY): Payer: Self-pay

## 2024-07-26 ENCOUNTER — Other Ambulatory Visit: Payer: Self-pay | Admitting: Surgery

## 2024-07-26 ENCOUNTER — Ambulatory Visit: Admitting: Urology

## 2024-07-26 DIAGNOSIS — S3011XA Contusion of abdominal wall, initial encounter: Secondary | ICD-10-CM

## 2024-07-26 NOTE — Addendum Note (Signed)
 Encounter addended by: Debby Maurilio BROCKS, RT on: 07/26/2024 12:39 PM  Actions taken: Imaging Exam ended, Charge Capture section accepted

## 2024-07-26 NOTE — Addendum Note (Signed)
 Encounter addended by: Debby Maurilio BROCKS, RT on: 07/26/2024 12:44 PM  Actions taken: Imaging Exam ended, Charge Capture section accepted

## 2024-07-30 ENCOUNTER — Ambulatory Visit
Admission: RE | Admit: 2024-07-30 | Discharge: 2024-07-30 | Disposition: A | Discharge status: Home / Self Care | Source: Ambulatory Visit | Attending: Surgery | Admitting: Surgery

## 2024-07-30 ENCOUNTER — Other Ambulatory Visit: Payer: Self-pay | Admitting: Surgery

## 2024-07-30 ENCOUNTER — Ambulatory Visit
Admission: RE | Admit: 2024-07-30 | Discharge: 2024-07-30 | Disposition: A | Discharge status: Home / Self Care | Source: Ambulatory Visit | Attending: Radiology | Admitting: Radiology

## 2024-07-30 DIAGNOSIS — S3011XA Contusion of abdominal wall, initial encounter: Secondary | ICD-10-CM

## 2024-07-30 HISTORY — PX: IR RADIOLOGIST EVAL & MGMT: IMG5224

## 2024-07-30 MED ORDER — IOPAMIDOL (ISOVUE-300) INJECTION 61%: 100.0000 mL | Freq: Once | INTRAVENOUS | Status: DC | PRN

## 2024-07-30 NOTE — Progress Notes (Signed)
 Chief Complaint: Patient was seen in consultation today for intra-abdominal abscess at the request of Stephen Cervantes  Referring Physician(s): Stephen Cervantes  Supervising Physician: Karalee Beat  History of Present Illness: Stephen Cervantes is a 83 y.o. male with past medical history significant for depression, BPH, duodenal ulcer, HTN, HLD and ventral hernia s/p repair initially in 2008 with removal of infected mesh 06/07/24 and subsequent development of intra-abdominal fluid collection s/p RLQ drain placement in IR 07/13/24 who presents today for drain follow up.  [name] reports doing well since he was hospitalized, no fevers, chills or significant abdominal pain. He has been flushing the drain with 10 mL of normal saline per day without difficulty or leakage from the insertion site. Output has been clear yellow, 1-2 mL daily for over a week. He is not currently taking antibiotics. Next follow up with surgery is 08/07/24.  CT abd/pelvis w/o contrast obtained today and shows:  1. Significant reduction in volume of the previously identified preperitoneal abscess cavity. The rim of the collection remains thickened. No significant undrained fluid collection. 2. Well-positioned drainage catheter. 3. Additional ancillary findings as above  Past Medical History:  Diagnosis Date   Arthritis    Asthma    Blood transfusion without reported diagnosis    Bowel obstruction (HCC) 7988,7984   Depression    Diverticulitis 2006   Enlarged prostate    Gallstones 07/04/2014   History of gastrointestinal ulcer    History of kidney stones    HLD (hyperlipidemia)    HTN (hypertension)    Kidney stone    kidney stones   Neuritis or radiculitis due to rupture of lumbar intervertebral disc 05/29/2015   Thyroid disease     Past Surgical History:  Procedure Laterality Date   ABDOMINAL WALL DEFECT REPAIR N/A 06/07/2024   Procedure: REPAIR, ABDOMINAL WALL;  Surgeon: Desiderio Schanz,  MD;  Location: [facility];  Service: General;  Laterality: N/A;   APPENDECTOMY     BOWEL RESECTION  06/07/2024   Procedure: EXCISION, SMALL INTESTINE;  Surgeon: Desiderio Schanz, MD;  Location: [facility];  Service: General;;   CHOLECYSTECTOMY N/A 06/07/2024   Procedure: CHOLECYSTECTOMY;  Surgeon: Desiderio Schanz, MD;  Location: [facility];  Service: General;  Laterality: N/A;   COLECTOMY  09/05/2004   Abdominal colectomy, ileostomy, Hartman procedure for perforation of cecum, sigmoid diverticulitis   ESOPHAGOGASTRODUODENOSCOPY N/A 05/30/2024   Procedure: EGD (ESOPHAGOGASTRODUODENOSCOPY);  Surgeon: [name], MD;  Location: [facility] ENDOSCOPY;  Service: Gastroenterology;  Laterality: N/A;   EXCISION OF MESH N/A 06/07/2024   Procedure: REMOVAL, MESH, ABDOMEN OR PELVIS;  Surgeon: Desiderio Schanz, MD;  Location: [facility];  Service: General;  Laterality: N/A;   HERNIA REPAIR  2008?   [facility]   ILEOSTOMY  09/05/2005   JOINT REPLACEMENT  09/05/2012   Hip Replacement Stephen Cervantes   KNEE SURGERY Right 09/05/2009   POSTERIOR LUMBAR FUSION 4 LEVEL N/A 03/13/2019   Procedure: POSTERIOR LUMBAR FUSION 4 LEVEL L1-5;  Surgeon: Clois Fret, MD;  Location: [facility];  Service: Neurosurgery;  Laterality: N/A;   TRIGGER FINGER RELEASE Right 05/05/2021   Procedure: RELEASE TRIGGER FINGER/A-1 PULLEY;  Surgeon: Edie Norleen PARAS, MD;  Location: [facility] OR[facility];  Service: Orthopedics;  Laterality: Right;    Allergies: Morphine  and codeine and Tape  Medications: Prior to Admission medications   Medication Sig Start Date End Date Taking? Authorizing Provider  acetaminophen  (TYLENOL ) 325 MG tablet Take 2 tablets (650 mg total) by mouth  every 6 (six) hours as needed for mild pain (pain score 1-3), fever or moderate pain (pain score 4-6) (or Fever >/= 101). 07/16/24 08/15/24  Lenon Marien CROME, MD  atorvastatin  (LIPITOR) 10 MG tablet Take 10 mg by mouth daily.     [provider]  ferrous  sulfate 325 (65 FE) MG tablet Take 325 mg by mouth daily with breakfast.    [provider]  finasteride  (PROSCAR ) 5 MG tablet TAKE ONE TABLET EVERY DAY 01/12/24   McGowan, Clotilda A, PA-C  levothyroxine  (SYNTHROID ) 125 MCG tablet Take 125 mcg by mouth daily. 03/05/24   [provider]  loperamide  (IMODIUM ) 2 MG capsule Take 6 mg by mouth in the morning, at noon, and at bedtime.    [provider]  losartan  (COZAAR ) 50 MG tablet Take 50 mg by mouth daily. 03/05/24   [provider]  montelukast  (SINGULAIR ) 10 MG tablet Take 1 tablet by mouth daily. 06/16/23   [provider]  Multiple Vitamin (MULTI-VITAMIN) tablet Take 1 tablet by mouth daily.    [provider]  ondansetron  (ZOFRAN -ODT) 4 MG disintegrating tablet Take 1 tablet (4 mg total) by mouth every 8 (eight) hours as needed for nausea or vomiting. 07/12/24   Pabon, Laneta FALCON, MD  oxyCODONE  (OXY IR/ROXICODONE ) 5 MG immediate release tablet Take 1-2 tablets (5-10 mg total) by mouth every 6 (six) hours as needed for severe pain (pain score 7-10) or breakthrough pain. 06/14/24   [name], [name], PA-C  potassium citrate  (UROCIT-K ) 10 MEQ (1080 MG) SR tablet Take 10 mEq by mouth 4 (four) times daily. 05/01/24   [provider]  sodium chloride  flush 0.9 % SOLN injection 10 mLs by Intracatheter route daily. 07/16/24   McInnis, Stephen M, PA-C  sucralfate  (CARAFATE ) 1 g tablet Take 1 g by mouth in the morning, at noon, and at bedtime.    [provider]  tamsulosin  (FLOMAX ) 0.4 MG CAPS capsule TAKE 1 CAPSULE BY MOUTH ONCE DAILY 01/12/24   McGowan, Clotilda LABOR, PA-C     Family History  Problem Relation Age of Onset   Cancer Father        cancer of lung, brain  and lymphoma   Parkinson's disease Brother    Mesothelioma Brother    Kidney disease Neg Hx    Prostate cancer Neg Hx    Kidney cancer Neg Hx    Bladder Cancer Neg Hx     Social History   Socioeconomic History   Marital  status: Single    Spouse name: Not on file   Number of children: Not on file   Years of education: Not on file   Highest education level: Not on file  Occupational History   Not on file  Tobacco Use   Smoking status: Former    Current packs/day: 0.00    Types: Cigarettes    Quit date: 1998    Years since quitting: 27.9    Passive exposure: Past   Smokeless tobacco: Never  Vaping Use   Vaping status: Never Used  Substance and Sexual Activity   Alcohol use: No    Alcohol/week: 0.0 standard drinks of alcohol   Drug use: No   Sexual activity: Not on file  Other Topics Concern   Not on file  Social History Narrative   Lives with GF [name].          ** Merged History Encounter **    Social Drivers of Dispensing Optician  Resource Strain: Low Risk  (05/15/2024)   Received from [facility]   Overall Financial Resource Strain (CARDIA)    Difficulty of Paying Living Expenses: Not hard at all  Food Insecurity: No Food Insecurity (07/13/2024)   Hunger Vital Sign    Worried About Running Out of Food in the Last Year: Never true    Ran Out of Food in the Last Year: Never true  Transportation Needs: No Transportation Needs (07/13/2024)   PRAPARE - Administrator, Civil Service (Medical): No    Lack of Transportation (Non-Medical): No  Physical Activity: Not on file  Stress: Not on file  Social Connections: Moderately Integrated (07/13/2024)   Social Connection and Isolation Panel    Frequency of Communication with Friends and Family: Twice a week    Frequency of Social Gatherings with Friends and Family: Once a week    Attends Religious Services: Never    Database Administrator or Organizations: Yes    Attends Engineer, Structural: 1 to 4 times per year    Marital Status: Living with partner  Recent Concern: Social Connections - Moderately Isolated (06/05/2024)   Social Connection and Isolation Panel    Frequency of Communication with  Friends and Family: More than three times a week    Frequency of Social Gatherings with Friends and Family: More than three times a week    Attends Religious Services: Never    Database Administrator or Organizations: No    Attends Banker Meetings: Never    Marital Status: Living with partner    Review of Systems: A 12 point ROS discussed and pertinent positives are indicated in the HPI above.  All other systems are negative.  Review of Systems  Constitutional:  Negative for chills and fever.  Respiratory:  Negative for shortness of breath.   Cardiovascular:  Negative for chest pain.  Gastrointestinal:  Negative for abdominal pain, diarrhea, nausea and vomiting.    Vital Signs: 97.3 F, 176/88 mmHg, HR 59, spO2 97% RA, RR 16  Physical Exam Vitals reviewed.  Constitutional:      General: He is not in acute distress. HENT:     Head: Normocephalic.  Cardiovascular:     Rate and Rhythm: Normal rate.  Pulmonary:     Effort: Pulmonary effort is normal.  Abdominal:     Palpations: Abdomen is soft.     Comments: (+) midline surgical incision with some crusting, no open wounds or drainage. No significant erythema or edema  (+) RLQ drain to gravity with ~1 mL serous output in bag. Insertion site unremarkable, no drainage, bleeding, erythema or edema. Aspiration of drain with patient laying on his left side yields ~3 mL of serous fluid. Drain and retention suture were removed in tact today with complication.  Skin:    General: Skin is warm and dry.  Neurological:     Mental Status: He is alert and oriented to person, place, and time.  Psychiatric:        Mood and Affect: Mood normal.        Behavior: Behavior normal.        Thought Content: Thought content normal.        Judgment: Judgment normal.      Imaging: CT GUIDED PERITONEAL/RETROPERITONEAL FLUID DRAIN BY PERC CATH Result Date: 07/13/2024 INDICATION: Large intra-abdominal abscess collection. EXAM: CT-guided  drain placement TECHNIQUE: Multidetector CT imaging of the abdomen was performed following the standard protocol  without IV contrast. RADIATION DOSE REDUCTION: This exam was performed according to the departmental dose-optimization program which includes automated exposure control, adjustment of the mA and/or kV according to patient size and/or use of iterative reconstruction technique. MEDICATIONS: The patient is currently admitted to the hospital and receiving intravenous antibiotics. The antibiotics were administered within an appropriate time frame prior to the initiation of the procedure. ANESTHESIA/SEDATION: 2 mg Versed  were administered intravenously by the Radiology nurse for anxiolysis. This does not constitute moderate sedation. COMPLICATIONS: None immediate. PROCEDURE: Informed written consent was obtained from the patient after a thorough discussion of the procedural risks, benefits and alternatives. All questions were addressed. Maximal Sterile Barrier Technique was utilized including caps, mask, sterile gowns, sterile gloves, sterile drape, hand hygiene and skin antiseptic. A timeout was performed prior to the initiation of the procedure. A scanning CT scan was performed. The large fluid and gas collection is identified. A suitable skin entry site from a right lower quadrant approach was selected and marked. Local anesthesia was attained by infiltration with 1% lidocaine . A small dermatotomy was made. Under intermittent CT guidance, an 18 gauge trocar needle was advanced into the collection. A 0.035 wire was then advanced into the collection. The needle was removed. The percutaneous tract was dilated to 12 French. A skater 12 French biliary drainage catheter with multiple sideholes was advanced over the wire and formed. Aspiration was then performed evacuated in a total of 2200 mL frankly purulent fluid. The drainage catheter was then flushed. CT imaging demonstrates a collapsed abscess cavity. The  catheter was secured to the skin with 0 Prolene suture. The patient tolerated the procedure well. IMPRESSION: Successful placement of a 63 French biliary drainage catheter via a right lower quadrant approach with evacuation of 2200 mL frankly purulent fluid. Samples were sent for Gram stain and culture. Electronically Signed   By: Wilkie Lent Cervantes.D.   On: 07/13/2024 18:40   CT ABDOMEN PELVIS WO CONTRAST Result Date: 07/13/2024 EXAM: CT ABDOMEN AND PELVIS WITHOUT CONTRAST 07/13/2024 01:52:43 AM TECHNIQUE: CT of the abdomen and pelvis was performed without the administration of intravenous contrast. Multiplanar reformatted images are provided for review. Automated exposure control, iterative reconstruction, and/or weight-based adjustment of the mA/kV was utilized to reduce the radiation dose to as low as reasonably achievable. COMPARISON: CT abdomen and pelvis 07/12/2024. CLINICAL HISTORY: eval seroma vs fistula eval seroma vs fistula FINDINGS: LOWER CHEST: No acute abnormality. LIVER: The liver is unremarkable. GALLBLADDER AND BILE DUCTS: Cholecystectomy. No biliary ductal dilatation. SPLEEN: No acute abnormality. PANCREAS: No acute abnormality. ADRENAL GLANDS: No acute abnormality. KIDNEYS, URETERS AND BLADDER: No stones in the kidneys or ureters. No hydronephrosis. No perinephric or periureteral stranding. Urinary bladder is unremarkable. GI AND BOWEL: Stomach demonstrates no acute abnormality. There is no bowel obstruction. No evidence of extraluminal oral contrast within the fluid and gas collection. Postoperative change of colectomy with ileorectal anastomosis. Small bowel anastomosis in the left lower quadrant. PERITONEUM AND RETROPERITONEUM: No ascites. No free air. VASCULATURE: Aorta is normal in caliber. LYMPH NODES: No lymphadenopathy. REPRODUCTIVE ORGANS: No acute abnormality. BONES AND SOFT TISSUES: Posterior lumbar fusion. Right hip arthroplasty. Recent postoperative change of ventral abdominal  hernia repair. The large fluid and gas collection in the anterior abdominal wall is unchanged. IMPRESSION: 1. Large fluid and gas collection in the anterior abdominal wall. No evidence of extraluminal oral contrast within the collection. Electronically signed by: Norman Gatlin MD 07/13/2024 02:00 AM EST RP Workstation: [id number]   CT ABDOMEN PELVIS  W CONTRAST Result Date: 07/12/2024 EXAM: CT ABDOMEN AND PELVIS WITH CONTRAST 07/12/2024 09:50:45 PM TECHNIQUE: CT of the abdomen and pelvis was performed with the administration of intravenous contrast. Multiplanar reformatted images are provided for review. Automated exposure control, iterative reconstruction, and/or weight-based adjustment of the mA/kV was utilized to reduce the radiation dose to as low as reasonably achievable. COMPARISON: CT 06/05/2024 CLINICAL HISTORY: Bowel obstruction suspected. FINDINGS: LOWER CHEST: Bibasilar atelectasis/scarring. No active pulmonary identified. LIVER: The liver is unremarkable. GALLBLADDER AND BILE DUCTS: Cholecystectomy. No biliary ductal dilatation. SPLEEN: No acute abnormality. PANCREAS: No acute abnormality. ADRENAL GLANDS: No acute abnormality. KIDNEYS, URETERS AND BLADDER: Nonobstructing bilateral nephrolithiasis. No hydronephrosis. No perinephric or periureteral stranding. Urinary bladder is unremarkable. GI AND BOWEL: Stomach demonstrates no acute abnormality. Postoperative change of small bowel resection with anastomosis in the left lower quadrant. Postoperative change of colectomy with ileal rectal anastomosis. There is no bowel obstruction. PERITONEUM AND RETROPERITONEUM: Postoperative change of ventral abdominal hernia repair. VASCULATURE: Aorta is normal in caliber. LYMPH NODES: No lymphadenopathy. REPRODUCTIVE ORGANS: No acute abnormality. BONES AND SOFT TISSUES: Posterior fusion in the lumbar spine. Right hip arthroplasty. Massive gas and fluid collection in the central abdominal wall measuring 23.6 x 10.0 x  26.9 cm (ML by AP by CC). This is likely postoperative related to the abdominal wall defect repair on 06/07/2024. Sterility is not determined by imaging. There is mild adjacent stranding in the central abdominal wall musculature and subcutaneous fat. Given the gas within the collection and previous concern for subtle increased density within the collection due to oral contrast extravasation, a fistulous connection with the abutting small bowel is suspected and not definitively identified. No acute osseous abnormality. IMPRESSION: 1. Massive central abdominal wall fluid collection (23.6 x 10.0 x 26.9 cm), likely related to hernia repair. Given gas within the collection and prior concern for oral contrast extravasation, a fistulous connection with the abutting small bowel is suspected but not clearly identified. CT abdomen and pelvis with oral contrast to evaluate for small bowel fistula. Electronically signed by: Norman Gatlin MD 07/12/2024 10:08 PM EST RP Workstation: [id number]    Labs:  CBC: Recent Labs    06/14/24 0752 07/12/24 1626 07/14/24 0818 07/14/24 1502 07/15/24 0722  WBC 13.8* 8.6 5.8  --  6.1  HGB 8.5* 8.3* 6.8* 8.9* 8.8*  HCT 25.9* 26.6* 21.8* 27.8* 28.1*  PLT 214 334 248  --  299    COAGS: Recent Labs    06/05/24 1851 07/13/24 0839  INR 1.2 1.4*    BMP: Recent Labs    06/12/24 0527 07/12/24 1626 07/14/24 0818 07/15/24 0722  NA 138 136 134* 136  K 3.6 4.7 3.8 3.7  CL 111 99 103 104  CO2 22 23 21* 22  GLUCOSE 114* 114* 92 108*  BUN 25* 29* 25* 19  CALCIUM  8.1* 9.6 7.8* 8.2*  CREATININE 1.34* 1.81* 1.40* 1.17  GFRNONAA 53* 37* 50* >60    LIVER FUNCTION TESTS: Recent Labs    03/24/24 1054 03/29/24 1504 06/05/24 1851 07/12/24 1626  BILITOT 2.2* 0.6 0.8 0.8  AST 28 50* 24 24  ALT 30 50* 14 13  ALKPHOS 223* 378* 150* 186*  PROT 7.3 7.1 7.3 7.7  ALBUMIN  3.3* 3.1* 3.2* 2.7*    TUMOR MARKERS: No results for input(s): AFPTM, CEA, CA199,  CHROMGRNA in the last 8760 hours.  Assessment:  83 y/o Cervantes with history of exploratory laparotomy and excision of infected mesh from previous ventral hernia repair 06/07/24 who subsequently  developed post operative fluid collection requiring RLQ drain placement in IR 07/13/24 who presents today for drain follow up.  Per patient he has been feeling well, no longer on antibiotics and following with general surgery regularly. He continues to flush the drain without difficulty or leakage with 1-2 mL of serous output daily. Aspiration of drain today yields only ~3 mL of serous fluid.  Patient history and imaging from today reviewed by Dr. Karalee who recommends removal.   Plan: - Drain and retention suture removed in tact today without difficulty. Dressing applied, patient counseled to not submerge the insertion site for at least 3 days. - Maintain follow up with general surgery on 12/3 to discuss further surgical plans. - No further IR needs at this time, patient encouraged to call with questions or concerns as needed.  Electronically Signed: Clotilda DELENA Hesselbach PA-C 07/30/2024, 1:02 PM   I spent a total of 30 Minutes  in face to face in clinical consultation, greater than 50% of which was counseling/coordinating care for intra-abdominal fluid collection.

## 2024-07-31 ENCOUNTER — Encounter: Admitting: Surgery

## 2024-08-06 NOTE — Progress Notes (Unsigned)
 08/08/2024 3:28 PM   Stephen Cervantes 1940-10-06 [id number]  Referring provider: Fernande Ophelia [name], MD [address] [facility] [place],  [place] [address]  Urological history: 1. High risk hematuria - former smoker (79EB) - 2016 - CT Urogram - nephrolithiasis, cysto with very large prostate - Cystoscopy with Dr. Alvaro in 09/2015 enlarged prostate - 11/2019 - CT Urogram - Bilateral nonobstructing renal stones.  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 Nephrolithiasis - Pre 2016 - medicla passage x several, URS x 1 - 08/2015 - medical passage left 6mm ureteral stone, LLP 1cm non-obstructing as well. No right sided stones - CT (2021) -bilateral nonobstructing renal stones - Composition Uric Acid  - managed with KCit 20 BID    3. BPH with LU TS - PSA 1.66 in 08/2020 - tamsulosin  0.4 mg daily and finasteride  5 mg daily    4. 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  Presented to the ED on 03/17/2019 and had Foley place for > 800cc of urine - He presented to the office on 03/27/2019 for a TOV and was instructed in CIC.  Failed 1 st TOV on 07/22 and had Foley placed for > 1000cc.   - Foley catheter was removed 04/09/2019 - return scan 110 mL   Chief Complaint  Patient presents with   Follow-up   Renal cell adenocarcinoma, left (HCC   HPI: Stephen Cervantes is a 28 y.o. man who presents today for 6 months follow up.   Previous records reviewed.  He underwent exploratory laparotomy in October for infected mesh related to a prior ventral hernia repair and then developed a large seroma with possible fistulous tract.  He had several CT scans  and one MRI since the CT urogram in May.  The 1.5 cm left renal mass appears stable in size.  He has another contrast CT in January for surveillance of his abdominal abscess.    I PSS 3/1   He reports a weak urinary stream.  Patient denies any modifying or aggravating factors.  Patient denies any recent UTI's, gross hematuria, dysuria or suprapubic/flank pain.  Patient denies any fevers, chills, nausea or vomiting.    Serum creatinine 1.17  BPH meds: Tamsulosin  0.4 mg daily and finasteride  5 mg daily   PMH: Past Medical History:  Diagnosis Date   Arthritis    Asthma    Blood transfusion without reported diagnosis    Bowel obstruction (HCC) 7988,7984   Depression    Diverticulitis 2006   Enlarged prostate    Gallstones 07/04/2014   History of gastrointestinal ulcer    History of kidney stones    HLD (hyperlipidemia)    HTN (hypertension)    Kidney stone    kidney stones   Neuritis or radiculitis due to rupture of lumbar intervertebral disc 05/29/2015   Thyroid disease     Surgical History: Past Surgical History:  Procedure Laterality Date   ABDOMINAL WALL DEFECT REPAIR N/A 06/07/2024   Procedure: REPAIR, ABDOMINAL WALL;  Surgeon: Desiderio Schanz, MD;  Location: [facility] ORS;  Service: General;  Laterality: N/A;   APPENDECTOMY     BOWEL RESECTION  06/07/2024   Procedure: EXCISION, SMALL INTESTINE;  Surgeon: Desiderio Schanz, MD;  Location: [facility];  Service: General;;   CHOLECYSTECTOMY N/A 06/07/2024   Procedure: CHOLECYSTECTOMY;  Surgeon: Desiderio Schanz, MD;  Location: [facility];  Service: General;  Laterality: N/A;   COLECTOMY  09/05/2004   Abdominal colectomy, ileostomy, Hartman procedure for perforation of cecum, sigmoid diverticulitis   ESOPHAGOGASTRODUODENOSCOPY N/A 05/30/2024   Procedure: EGD (ESOPHAGOGASTRODUODENOSCOPY);  Surgeon: [name], MD;  Location: [facility] ENDOSCOPY;  Service: Gastroenterology;  Laterality: N/A;   EXCISION OF MESH N/A 06/07/2024   Procedure:  REMOVAL, MESH, ABDOMEN OR PELVIS;  Surgeon: Desiderio Schanz, MD;  Location: [facility];  Service: General;  Laterality: N/A;   HERNIA REPAIR  2008?   [facility]   ILEOSTOMY  09/05/2005   IR RADIOLOGIST EVAL & MGMT  07/30/2024   JOINT REPLACEMENT  09/05/2012   Hip Replacement [name]   KNEE SURGERY Right 09/05/2009   POSTERIOR LUMBAR FUSION 4 LEVEL N/A 03/13/2019   Procedure: POSTERIOR LUMBAR FUSION 4 LEVEL L1-5;  Surgeon: Clois Fret, MD;  Location: [facility] ORS;  Service: Neurosurgery;  Laterality: N/A;   TRIGGER FINGER RELEASE Right 05/05/2021   Procedure: RELEASE TRIGGER FINGER/A-1 PULLEY;  Surgeon: Edie Norleen PARAS, MD;  Location: [facility] ORS;  Service: Orthopedics;  Laterality: Right;    Home Medications:  Allergies as of 08/08/2024       Reactions   Morphine  And Codeine Other (See Comments)   Severe hallucinations   Tape Other (See Comments)   Silk Tape per patient peels off my skin   Cipro  [ciprofloxacin  Hcl] Itching        Medication List        Accurate as of August 08, 2024 11:59 PM. If you have any questions, ask your nurse or doctor.          acetaminophen  325 MG tablet Commonly known as: TYLENOL  Take 2 tablets (650 mg total) by mouth every 6 (six) hours as needed for mild pain (pain score 1-3), fever or moderate pain (pain score 4-6) (or Fever >/= 101).   atorvastatin  10 MG tablet Commonly known as: LIPITOR Take 10 mg by mouth daily.   ferrous sulfate 325 (65 FE) MG tablet Take 325 mg by mouth daily with breakfast.   finasteride  5 MG tablet Commonly known as: PROSCAR  TAKE ONE TABLET EVERY DAY   levothyroxine  125 MCG tablet Commonly known as: SYNTHROID  Take 125 mcg by mouth daily.   loperamide  2 MG capsule Commonly known as: IMODIUM  Take 6 mg by mouth in the morning, at noon, and at bedtime.   losartan  50 MG tablet Commonly known as: COZAAR  Take 50 mg by mouth daily.   montelukast  10 MG tablet Commonly known as:  SINGULAIR  Take 1 tablet by mouth daily.   Multi-Vitamin tablet Take 1 tablet by mouth daily.   ondansetron  4 MG disintegrating tablet Commonly known as: ZOFRAN -ODT Take 1 tablet (4 mg total) by mouth every 8 (eight) hours as needed for nausea or vomiting.   oxyCODONE  5 MG immediate release tablet Commonly known as: Oxy IR/ROXICODONE  Take 1-2 tablets (5-10 mg total) by mouth every 6 (six) hours as needed for severe pain (pain score 7-10) or breakthrough pain.   pantoprazole  40 MG tablet Commonly known as: PROTONIX  Take 40 mg by mouth 2 (two) times daily.   potassium citrate  10 MEQ (1080 MG) SR tablet Commonly known as: UROCIT-K  Take 10 mEq by mouth 4 (four) times daily.   sodium chloride  flush 0.9 % Soln injection 10 mLs by  Intracatheter route daily.   sucralfate  1 g tablet Commonly known as: CARAFATE  Take 1 g by mouth in the morning, at noon, and at bedtime.   tamsulosin  0.4 MG Caps capsule Commonly known as: FLOMAX  TAKE 1 CAPSULE BY MOUTH ONCE DAILY        Allergies:  Allergies  Allergen Reactions   Morphine  And Codeine Other (See Comments)    Severe hallucinations   Tape Other (See Comments)    Silk Tape per patient peels off my skin   Cipro  [Ciprofloxacin  Hcl] Itching    Family History: Family History  Problem Relation Age of Onset   Cancer Father        cancer of lung, brain  and lymphoma   Parkinson's disease Brother    Mesothelioma Brother    Kidney disease Neg Hx    Prostate cancer Neg Hx    Kidney cancer Neg Hx    Bladder Cancer Neg Hx     Social History:  reports that he quit smoking about 27 years ago. His smoking use included cigarettes. He has been exposed to tobacco smoke. He has never used smokeless tobacco. He reports that he does not drink alcohol and does not use drugs.  ROS: Pertinent ROS in HPI  Physical Exam: BP (!) 155/82 (BP Location: Left Arm, Patient Position: Sitting, Cuff Size: Normal)   Pulse 74   Wt 164 lb (74.4 kg)    SpO2 96%   BMI 24.22 kg/m   Constitutional:  Well nourished. Alert and oriented, No acute distress. HEENT: [place] AT, moist mucus membranes.  Trachea midline Cardiovascular: No clubbing, cyanosis, or edema. Respiratory: Normal respiratory effort, no increased work of breathing. Neurologic: Grossly intact, no focal deficits, moving all 4 extremities. Psychiatric: Normal mood and affect.  Laboratory Data: See Epic and HPI   I have reviewed the labs.   Pertinent Imaging: I have independently reviewed the films.  See HPI.    Assessment & Plan:    1. Left renal mass - stable on serial CT's and MRI since May, continue surveillance q 6 months - has an upcoming contrast CT in January, will follow those results as well   2. BPH with LU TS - mild symptoms and he is pleased - encouraged avoiding bladder irritants, fluid restriction before bedtime and timed voiding's - Continue tamsulosin  0.4 mg daily and finasteride  5 mg daily  Return for follow up pending CT in January .  These notes generated with voice recognition software. I apologize for typographical errors.  CLOTILDA HELON RIGGERS  [facility] [address]  [address] [place], [place] [address] [phone]

## 2024-08-07 ENCOUNTER — Ambulatory Visit: Admitting: Surgery

## 2024-08-07 ENCOUNTER — Encounter: Payer: Self-pay | Admitting: Surgery

## 2024-08-07 VITALS — BP 138/86 | HR 89 | Ht 69.0 in | Wt 164.0 lb

## 2024-08-07 DIAGNOSIS — K8020 Calculus of gallbladder without cholecystitis without obstruction: Secondary | ICD-10-CM

## 2024-08-07 DIAGNOSIS — K651 Peritoneal abscess: Secondary | ICD-10-CM

## 2024-08-07 DIAGNOSIS — L02211 Cutaneous abscess of abdominal wall: Secondary | ICD-10-CM

## 2024-08-07 DIAGNOSIS — S3011XD Contusion of abdominal wall, subsequent encounter: Secondary | ICD-10-CM

## 2024-08-07 DIAGNOSIS — Z8719 Personal history of other diseases of the digestive system: Secondary | ICD-10-CM

## 2024-08-07 NOTE — Patient Instructions (Signed)
 We will schedule you for a CT scan in one month and then follow up after we get these results.    We have scheduled you for a CT Scan of your Abdomen and Pelvis with contrast. This has been scheduled at [facility] on Wednesday 09/11/24 . Please arrive there by 10:45 am. If you need to reschedule your Scan, you may do so by calling [phone]. Please let us  know if you reschedule your scan as we have to get authorization from your insurance for this.       Please call and ask to speak with a nurse if you develop questions or concerns.

## 2024-08-07 NOTE — Progress Notes (Signed)
 08/07/2024  History of Present Illness: Stephen Cervantes is a 83 y.o. male status post exploratory laparotomy, extensive lysis of adhesions, excision of infected mesh, debridement of midline skin wound/abscess cavity, small bowel resection with anastomosis, open cholecystectomy, bladder repair, recurrent ventral hernia repair with bilateral myocutaneous flaps with anterior release on 06/07/2024.   Patient was admitted on 07/12/24 with nausea, worsening abdominal pain and distention, and had a CT scan showing a very large fluid collection in the abdominal wall.  He had a percutaneous drain placed by IR on 07/13/24 which drained 2 L of purulent fluid.  Cultures grew Citrobacter and Klebsiella, and was discharged home on 07/16/24 on Cipro  and Flagyl .    He had repeat CT scan on 07/30/2024 which showed improvement in his fluid collection.  His drain was removed by interventional radiology that day.  He reports that he has been doing well since then.  Denies any worsening pain or swelling or nausea like before.  Reports still some tenderness at the drain insertion site.  Still reports some itching but this has been improving.   Past Medical History: Past Medical History:  Diagnosis Date   Arthritis    Asthma    Blood transfusion without reported diagnosis    Bowel obstruction (HCC) 7988,7984   Depression    Diverticulitis 2006   Enlarged prostate    Gallstones 07/04/2014   History of gastrointestinal ulcer    History of kidney stones    HLD (hyperlipidemia)    HTN (hypertension)    Kidney stone    kidney stones   Neuritis or radiculitis due to rupture of lumbar intervertebral disc 05/29/2015   Thyroid disease      Past Surgical History: Past Surgical History:  Procedure Laterality Date   ABDOMINAL WALL DEFECT REPAIR N/A 06/07/2024   Procedure: REPAIR, ABDOMINAL WALL;  Surgeon: Desiderio Schanz, MD;  Location: [facility];  Service: General;  Laterality: N/A;   APPENDECTOMY     BOWEL RESECTION   06/07/2024   Procedure: EXCISION, SMALL INTESTINE;  Surgeon: Desiderio Schanz, MD;  Location: [facility];  Service: General;;   CHOLECYSTECTOMY N/A 06/07/2024   Procedure: CHOLECYSTECTOMY;  Surgeon: Desiderio Schanz, MD;  Location: [facility];  Service: General;  Laterality: N/A;   COLECTOMY  09/05/2004   Abdominal colectomy, ileostomy, Hartman procedure for perforation of cecum, sigmoid diverticulitis   ESOPHAGOGASTRODUODENOSCOPY N/A 05/30/2024   Procedure: EGD (ESOPHAGOGASTRODUODENOSCOPY);  Surgeon: [name], MD;  Location: [facility] ENDOSCOPY;  Service: Gastroenterology;  Laterality: N/A;   EXCISION OF MESH N/A 06/07/2024   Procedure: REMOVAL, MESH, ABDOMEN OR PELVIS;  Surgeon: Desiderio Schanz, MD;  Location: [facility];  Service: General;  Laterality: N/A;   HERNIA REPAIR  2008?   [place]   ILEOSTOMY  09/05/2005   IR RADIOLOGIST EVAL & MGMT  07/30/2024   JOINT REPLACEMENT  09/05/2012   Hip Replacement [name]   KNEE SURGERY Right 09/05/2009   POSTERIOR LUMBAR FUSION 4 LEVEL N/A 03/13/2019   Procedure: POSTERIOR LUMBAR FUSION 4 LEVEL L1-5;  Surgeon: Clois Fret, MD;  Location: [facility] OR[facility];  Service: Neurosurgery;  Laterality: N/A;   TRIGGER FINGER RELEASE Right 05/05/2021   Procedure: RELEASE TRIGGER FINGER/A-1 PULLEY;  Surgeon: Edie Norleen PARAS, MD;  Location: [facility] OR[facility];  Service: Orthopedics;  Laterality: Right;    Home Medications: Prior to Admission medications   Medication Sig Start Date End Date Taking? Authorizing Provider  atorvastatin  (LIPITOR) 10 MG tablet Take 10 mg by mouth daily.  [provider]  celecoxib  (CELEBREX ) 200 MG capsule Take 1 capsule (200 mg total) by mouth daily. 03/13/23   Hilma Hastings, PA-C  ferrous sulfate 325 (65 FE) MG tablet Take 325 mg by mouth daily with breakfast.    [provider]  finasteride  (PROSCAR ) 5 MG tablet TAKE ONE TABLET EVERY DAY 01/12/24   McGowan, Clotilda A, PA-C  levothyroxine  (SYNTHROID ) 125 MCG  tablet Take 125 mcg by mouth daily. 03/05/24   [provider]  loperamide  (IMODIUM ) 2 MG capsule Take 6 mg by mouth in the morning, at noon, and at bedtime.    [provider]  losartan  (COZAAR ) 50 MG tablet Take 50 mg by mouth daily. 03/05/24   [provider]  montelukast  (SINGULAIR ) 10 MG tablet Take 1 tablet by mouth daily. 06/16/23   [provider]  Multiple Vitamin (MULTI-VITAMIN) tablet Take 1 tablet by mouth daily.    [provider]  oxyCODONE  (OXY IR/ROXICODONE ) 5 MG immediate release tablet Take 1-2 tablets (5-10 mg total) by mouth every 6 (six) hours as needed for severe pain (pain score 7-10) or breakthrough pain. 06/14/24   [name], [name], PA-C  pantoprazole  (PROTONIX ) 40 MG tablet Take 1 tablet (40 mg total) by mouth 2 (two) times daily before a meal for 14 days. 03/24/24 06/06/24  [name], MD  potassium citrate  (UROCIT-K ) 10 MEQ (1080 MG) SR tablet Take 10 mEq by mouth 4 (four) times daily. 05/01/24   [provider]  sucralfate  (CARAFATE ) 1 g tablet Take 1 g by mouth in the morning, at noon, and at bedtime.    [provider]  tamsulosin  (FLOMAX ) 0.4 MG CAPS capsule TAKE 1 CAPSULE BY MOUTH ONCE DAILY 01/12/24   Helon Clotilda A, PA-C    Allergies: Allergies  Allergen Reactions   Morphine  And Codeine Other (See Comments)    Severe hallucinations   Tape Other (See Comments)    Silk Tape per patient peels off my skin   Cipro  [Ciprofloxacin  Hcl] Itching    Review of Systems: Review of Systems  Constitutional:  Negative for chills and fever.  Respiratory:  Negative for shortness of breath.   Cardiovascular:  Negative for chest pain.  Gastrointestinal:  Positive for abdominal pain (discomfort at drain site). Negative for diarrhea, nausea and vomiting.  Skin:  Positive for itching.    Physical Exam BP 138/86   Pulse 89   Ht 5' 9 (1.753 m)   Wt 164 lb (74.4 kg)   SpO2 96%   BMI 24.22 kg/m   CONSTITUTIONAL: No acute distress HEENT:  Normocephalic, atraumatic, extraocular motion intact. RESPIRATORY:  Normal respiratory effort without pathologic use of accessory muscles. CARDIOVASCULAR: Regular rhythm and rate GI: The abdomen is soft, nondistended, appropriately tender to palpation.  The midline incision continues to heal well with some residual scab in 2 areas.  The scab was removed and dry gauze dressing was applied and area that was still very minimally open.  The drain site in right lower quadrant has healed well.   MUSCULOSKELETAL: Normal gait using a cane, no peripheral edema. NEUROLOGIC:  Motor and sensation is grossly normal.  Cranial nerves are grossly intact. PSYCH:  Alert and oriented to person, place and time. Affect is normal.   Assessment and Plan: This is a 83 y.o. male status post exploratory laparotomy, extensive lysis of adhesions, excision of infected mesh, debridement of midline skin wound/abscess cavity, small bowel resection with anastomosis, open cholecystectomy, bladder repair, recurrent ventral hernia repair with  bilateral myocutaneous flaps with anterior release  -- The patient's CT scan on 07/30/2024 showed much improvement in his fluid collection and his drain was removed by interventional radiology.  He has not had any worsening symptoms since then. - Discussed with patient that the itching will continue to improve.  The discomfort around the drain site will also continue to improve as inflammation from the drain subsides. - Patient will follow-up with me in 1 month at which time we will repeat CT scan to make sure the fluid collection has resolved more and to be sure there is no other complications.  I spent 20 minutes dedicated to the care of this patient on the date of this encounter to include pre-visit review of records, face-to-face time with the patient discussing diagnosis and management, and any post-visit coordination of care.   Aloysius Sheree Plant, MD [facility]

## 2024-08-08 ENCOUNTER — Ambulatory Visit: Admitting: Urology

## 2024-08-08 VITALS — BP 155/82 | HR 74 | Wt 164.0 lb

## 2024-08-08 DIAGNOSIS — N138 Other obstructive and reflux uropathy: Secondary | ICD-10-CM

## 2024-08-08 DIAGNOSIS — C642 Malignant neoplasm of left kidney, except renal pelvis: Secondary | ICD-10-CM | POA: Diagnosis not present

## 2024-08-08 DIAGNOSIS — N401 Enlarged prostate with lower urinary tract symptoms: Secondary | ICD-10-CM | POA: Diagnosis not present

## 2024-08-10 ENCOUNTER — Encounter: Payer: Self-pay | Admitting: Urology

## 2024-08-10 ENCOUNTER — Emergency Department

## 2024-08-10 ENCOUNTER — Other Ambulatory Visit: Payer: Self-pay

## 2024-08-10 ENCOUNTER — Encounter: Payer: Self-pay | Admitting: Emergency Medicine

## 2024-08-10 ENCOUNTER — Emergency Department
Admission: EM | Admit: 2024-08-10 | Discharge: 2024-08-10 | Disposition: A | Discharge status: Home / Self Care | Attending: Emergency Medicine | Admitting: Emergency Medicine

## 2024-08-10 DIAGNOSIS — R1033 Periumbilical pain: Secondary | ICD-10-CM | POA: Insufficient documentation

## 2024-08-10 DIAGNOSIS — R188 Other ascites: Secondary | ICD-10-CM | POA: Insufficient documentation

## 2024-08-10 LAB — COMPREHENSIVE METABOLIC PANEL WITH GFR
ALT: 12 U/L (ref 0–44)
AST: 24 U/L (ref 15–41)
Albumin: 3.9 g/dL (ref 3.5–5.0)
Alkaline Phosphatase: 106 U/L (ref 38–126)
Anion gap: 12 (ref 5–15)
BUN: 15 mg/dL (ref 8–23)
CO2: 25 mmol/L (ref 22–32)
Calcium: 9.8 mg/dL (ref 8.9–10.3)
Chloride: 102 mmol/L (ref 98–111)
Creatinine, Ser: 1 mg/dL (ref 0.61–1.24)
GFR, Estimated: >60 mL/min (ref >60)
Glucose, Bld: 99 mg/dL (ref 70–99)
Potassium: 4.7 mmol/L (ref 3.5–5.1)
Sodium: 139 mmol/L (ref 135–145)
Total Bilirubin: 0.4 mg/dL (ref 0.0–1.2)
Total Protein: 7.8 g/dL (ref 6.5–8.1)

## 2024-08-10 LAB — CBC
HCT: 38.7 % — ABNORMAL LOW (ref 39.0–52.0)
Hemoglobin: 11.8 g/dL — ABNORMAL LOW (ref 13.0–17.0)
MCH: 25.7 pg — ABNORMAL LOW (ref 26.0–34.0)
MCHC: 30.5 g/dL (ref 30.0–36.0)
MCV: 84.3 fL (ref 80.0–100.0)
Platelets: 223 K/uL (ref 150–400)
RBC: 4.59 MIL/uL (ref 4.22–5.81)
RDW: 18.4 % — ABNORMAL HIGH (ref 11.5–15.5)
WBC: 5.4 K/uL (ref 4.0–10.5)
nRBC: 0 % (ref 0.0–0.2)

## 2024-08-10 LAB — LIPASE, BLOOD: Lipase: 27 U/L (ref 11–51)

## 2024-08-10 MED ORDER — SULFAMETHOXAZOLE-TRIMETHOPRIM 800-160 MG PO TABS: 1.0000 | ORAL_TABLET | Freq: Two times a day (BID) | ORAL | 0 refills | Status: AC

## 2024-08-10 MED ORDER — SULFAMETHOXAZOLE-TRIMETHOPRIM 800-160 MG PO TABS
1.0000 | ORAL_TABLET | Freq: Once | ORAL | Status: AC
  Administered 2024-08-10: 1 via ORAL
  Filled 2024-08-10: qty 1

## 2024-08-10 MED ORDER — IOHEXOL 300 MG/ML  SOLN
100.0000 mL | Freq: Once | INTRAMUSCULAR | Status: AC | PRN
  Administered 2024-08-10: 100 mL via INTRAVENOUS

## 2024-08-10 NOTE — ED Notes (Signed)
 Drew 1 set cultures with ultrasound IV placement.

## 2024-08-10 NOTE — ED Triage Notes (Signed)
 Pt to ER with c/o abdominal pain and swelling.  States had abdominal mesh surgery in early October followed by an infection.  Denies n/v.

## 2024-08-10 NOTE — ED Notes (Signed)
 Redness and tenderness to surgical site (from Oct) since this AM. Surgery in Oct was to replace mesh for hernia. Recent abx use.

## 2024-08-10 NOTE — ED Notes (Signed)
 Pt to CT

## 2024-08-10 NOTE — ED Provider Notes (Signed)
 [facility] Provider Note   Event Date/Time   First MD Initiated Contact with Patient 08/10/24 1743     (approximate) History  Abdominal Pain  HPI Stephen Cervantes is a 83 y.o. male with a stated past medical history of abdominal ventral hernia repair with mesh and subsequent postsurgical abscess who presents complaining of abdominal distention with associated pain and decreased bowel movement over the last 24 hours.  Patient states that this is similar to symptoms he has had during his infection recently and was told by the surgeons answering service to present to the emergency department.  ROS: Patient currently denies any vision changes, tinnitus, difficulty speaking, facial droop, sore throat, chest pain, shortness of breath, nausea/vomiting/diarrhea, dysuria, or weakness/numbness/paresthesias in any extremity   Physical Exam  Triage Vital Signs: ED Triage Vitals  Encounter Vitals Group     BP 08/10/24 1658 (!) 160/91     Girls Systolic BP Percentile --      Girls Diastolic BP Percentile --      Boys Systolic BP Percentile --      Boys Diastolic BP Percentile --      Pulse Rate 08/10/24 1658 87     Resp 08/10/24 1658 18     Temp 08/10/24 1658 98.5 F (36.9 C)     Temp src --      SpO2 08/10/24 1658 100 %     Weight 08/10/24 1659 165 lb (74.8 kg)     Height 08/10/24 1659 5' 9 (1.753 m)     Head Circumference --      Peak Flow --      Pain Score 08/10/24 1659 3     Pain Loc --      Pain Education --      Exclude from Growth Chart --    Most recent vital signs: Vitals:   08/10/24 1658 08/10/24 1955  BP: (!) 160/91   Pulse: 87 80  Resp: 18 16  Temp: 98.5 F (36.9 C)   SpO2: 100% 98%   General: Awake, oriented x4. CV:  Good peripheral perfusion. Resp:  Normal effort. Abd:  No distention.  Nontender to palpation.  There is a mild amount of erythema along the right aspect of healing surgical incision site with no tenderness to palpation or  warmth Other:  Elderly well-developed, well-nourished Caucasian male resting comfortably in no acute distress ED Results / Procedures / Treatments  Labs (all labs ordered are listed, but only abnormal results are displayed) Labs Reviewed  CBC - Abnormal; Notable for the following components:      Result Value   Hemoglobin 11.8 (*)    HCT 38.7 (*)    MCH 25.7 (*)    RDW 18.4 (*)    All other components within normal limits  LIPASE, BLOOD  COMPREHENSIVE METABOLIC PANEL WITH GFR  URINALYSIS, ROUTINE W REFLEX MICROSCOPIC  RADIOLOGY ED MD interpretation: CT of the abdomen and pelvis with IV contrast independently interpreted and shows further decrease in size of fluid collection deep to the anterior abdominal wall there is also mild small bowel dilation with scattered gas/fluid levels compatible with early/intermittent obstruction versus ileus - All radiology independently interpreted and agree with radiology assessment Official radiology report(s): CT ABDOMEN PELVIS W CONTRAST Result Date: 08/10/2024 CLINICAL DATA:  Acute abdominal pain, distension EXAM: CT ABDOMEN AND PELVIS WITH CONTRAST TECHNIQUE: Multidetector CT imaging of the abdomen and pelvis was performed using the standard protocol following bolus administration of intravenous contrast.  RADIATION DOSE REDUCTION: This exam was performed according to the departmental dose-optimization program which includes automated exposure control, adjustment of the mA and/or kV according to patient size and/or use of iterative reconstruction technique. CONTRAST:  OMNIPAQUE  IOHEXOL  300 MG/ML  SOLN COMPARISON:  07/30/2024 FINDINGS: Lower chest: No acute pleural or parenchymal lung disease. Hepatobiliary: No focal liver abnormality is seen. Status post cholecystectomy. No biliary dilatation. Pancreas: Unremarkable. No pancreatic ductal dilatation or surrounding inflammatory changes. Spleen: Normal in size without focal abnormality. Adrenals/Urinary  Tract: There are bilateral nonobstructing renal calculi again identified, measuring 9 mm on the right and 12 mm on the left. No obstructive uropathy within either kidney. The adrenals are unremarkable. The bladder is decompressed, limiting its evaluation. Stomach/Bowel: Postsurgical changes from prior partial small-bowel resection and colectomy. There is mild small bowel dilatation measuring up to 2.5 cm, with scattered gas fluid levels. Gas and stool are seen throughout the distal bowel to the level of the rectum, with findings likely reflecting early/intermittent obstruction versus ileus. There is no bowel wall thickening or inflammatory change. Vascular/Lymphatic: Aortic atherosclerosis. No enlarged abdominal or pelvic lymph nodes. Reproductive: Stable prostatomegaly. Other: No free fluid or free intraperitoneal gas. Postsurgical changes are seen from prior ventral hernia repair. There is further decrease in size of the fluid collection deep to the anterior abdominal wall, now measuring up to 1 cm in maximal thickness. The percutaneous pigtail drainage catheter on prior study has been removed in the interim. Musculoskeletal: Right hip arthroplasty. Extensive postsurgical changes of the lumbar spine. There are no acute or destructive bony abnormalities. IMPRESSION: 1. Further decrease in size of the fluid collection deep to the anterior abdominal wall related to prior hernia repair. Interval movable of the pigtail drainage catheter. 2. Mild small bowel dilatation with scattered gas fluid levels, compatible with early/intermittent obstruction versus ileus. 3. Stable bilateral nonobstructing renal calculi. 4.  Aortic Atherosclerosis (ICD10-I70.0). Electronically Signed   By: Ozell Daring M.D.   On: 08/10/2024 18:59   PROCEDURES: Critical Care performed: No Procedures MEDICATIONS ORDERED IN ED: Medications  iohexol  (OMNIPAQUE ) 300 MG/ML solution 100 mL (100 mLs Intravenous Contrast Given 08/10/24 1842)   sulfamethoxazole -trimethoprim  (BACTRIM  DS) 800-160 MG per tablet 1 tablet (1 tablet Oral Given 08/10/24 2014)   IMPRESSION / MDM / ASSESSMENT AND PLAN / ED COURSE  I reviewed the triage vital signs and the nursing notes.                             The patient is on the cardiac monitor to evaluate for evidence of arrhythmia and/or significant heart rate changes. Patient's presentation is most consistent with acute presentation with potential threat to life or bodily function. Patient is an 83 year old male with the above-stated past medical history who presents complaining of abdominal pain with distention. DDx: Recurrent abdominal abscess, small bowel obstruction, colitis, gastritis Plan: CBC, CMP, lipase, CT of the abdomen and pelvis with IV contrast  I discussed the results of patient CT scan including decreasing fluid collection and possible intermittent small bowel obstruction with Dr. Desiderio and her general medicine team.  I agree with plan for discharge home at this time on Bactrim  for possible cellulitis of the abdominal wall as well as strict return precautions regarding patient's obstructive symptoms.  We reviewed patient, he had a bowel movement just prior to my interview as well as has been passing gas.  Patient has had no vomiting and therefore I am  less concerned for persistent small bowel obstruction at this time.  Patient given strict return precautions and all questions answered prior to discharge.  Dispo: Discharge home with general surgical follow-up   FINAL CLINICAL IMPRESSION(S) / ED DIAGNOSES   Final diagnoses:  Periumbilical abdominal pain  Intraabdominal fluid collection   Rx / DC Orders   ED Discharge Orders          Ordered    sulfamethoxazole -trimethoprim  (BACTRIM  DS) 800-160 MG tablet  2 times daily        08/10/24 1957           Note:  This document was prepared using Dragon voice recognition software and may include unintentional dictation errors.    Lateya Dauria K, MD 08/10/24 [phone]

## 2024-08-14 ENCOUNTER — Encounter: Payer: Self-pay | Admitting: Surgery

## 2024-08-14 ENCOUNTER — Ambulatory Visit: Admitting: Surgery

## 2024-08-14 VITALS — BP 169/76 | HR 80 | Temp 97.6°F | Ht 69.0 in | Wt 161.2 lb

## 2024-08-14 DIAGNOSIS — Z09 Encounter for follow-up examination after completed treatment for conditions other than malignant neoplasm: Secondary | ICD-10-CM | POA: Diagnosis not present

## 2024-08-14 DIAGNOSIS — S3011XD Contusion of abdominal wall, subsequent encounter: Secondary | ICD-10-CM | POA: Diagnosis not present

## 2024-08-14 DIAGNOSIS — Z8719 Personal history of other diseases of the digestive system: Secondary | ICD-10-CM

## 2024-08-14 DIAGNOSIS — K651 Peritoneal abscess: Secondary | ICD-10-CM

## 2024-08-14 NOTE — Patient Instructions (Signed)
 We will see you back in January after your CT. If you have any concerns, feel free to call our office.

## 2024-08-14 NOTE — Progress Notes (Signed)
 08/14/2024  History of Present Illness: Stephen Cervantes is a 83 y.o. male status post exploratory laparotomy, extensive lysis of adhesions, excision of infected mesh, debridement of midline skin wound/abscess cavity, small bowel resection with anastomosis, open cholecystectomy, bladder repair, recurrent ventral hernia repair with bilateral myocutaneous flaps with anterior release on 06/07/2024.   Patient was admitted on 07/12/24 with nausea, worsening abdominal pain and distention, and had a CT scan showing a very large fluid collection in the abdominal wall.  He had a percutaneous drain placed by IR on 07/13/24 which drained 2 L of purulent fluid.  Cultures grew Citrobacter and Klebsiella, and was discharged home on 07/16/24 on Cipro  and Flagyl .    He had repeat CT scan on 07/30/2024 which showed improvement in his fluid collection.  His drain was removed by interventional radiology that day.  He presented to the emergency room on 08/10/2024 as he felt that his abdomen was getting tender and distended again and he noticed an area of some mild redness on the left lower quadrant.  He had a CT scan of the abdomen pelvis which showed that the remnant fluid collection was actually smaller without any new inflammatory changes.  He did have mild small bowel dilation but the patient reported he was having bowel function at home.  As a precaution, we gave him a course of Bactrim  given the mild redness or erythema that he had on the left side.  Today, he reports that he has been doing very well and the last few days have been okay.  Denies any further pain or swelling.  Denies any further redness.  He is still having some itching from taking Cipro  last month but this has continued to improve.  Past Medical History: Past Medical History:  Diagnosis Date   Arthritis    Asthma    Blood transfusion without reported diagnosis    Bowel obstruction (HCC) 7988,7984   Depression    Diverticulitis 2006   Enlarged  prostate    Gallstones 07/04/2014   History of gastrointestinal ulcer    History of kidney stones    HLD (hyperlipidemia)    HTN (hypertension)    Kidney stone    kidney stones   Neuritis or radiculitis due to rupture of lumbar intervertebral disc 05/29/2015   Thyroid disease      Past Surgical History: Past Surgical History:  Procedure Laterality Date   ABDOMINAL WALL DEFECT REPAIR N/A 06/07/2024   Procedure: REPAIR, ABDOMINAL WALL;  Surgeon: Desiderio Schanz, MD;  Location: [facility] ORS;  Service: General;  Laterality: N/A;   APPENDECTOMY     BOWEL RESECTION  06/07/2024   Procedure: EXCISION, SMALL INTESTINE;  Surgeon: Desiderio Schanz, MD;  Location: [facility] ORS;  Service: General;;   CHOLECYSTECTOMY N/A 06/07/2024   Procedure: CHOLECYSTECTOMY;  Surgeon: Desiderio Schanz, MD;  Location: [facility] ORS;  Service: General;  Laterality: N/A;   COLECTOMY  09/05/2004   Abdominal colectomy, ileostomy, Hartman procedure for perforation of cecum, sigmoid diverticulitis   ESOPHAGOGASTRODUODENOSCOPY N/A 05/30/2024   Procedure: EGD (ESOPHAGOGASTRODUODENOSCOPY);  Surgeon: [name], MD;  Location[facility];  Service: Gastroenterology;  Laterality: N/A;   EXCISION OF MESH N/A 06/07/2024   Procedure: REMOVAL, MESH, ABDOMEN OR PELVIS;  Surgeon: Desiderio Schanz, MD;  Location: [facility] ORS;  Service: General;  Laterality: N/A;   HERNIA REPAIR  2008?   [facility]   ILEOSTOMY  09/05/2005   IR RADIOLOGIST EVAL & MGMT  07/30/2024   JOINT REPLACEMENT  09/05/2012  Hip Replacement [name]   KNEE SURGERY Right 09/05/2009   POSTERIOR LUMBAR FUSION 4 LEVEL N/A 03/13/2019   Procedure: POSTERIOR LUMBAR FUSION 4 LEVEL L1-5;  Surgeon: Clois Fret, MD;  Location: [facility] ORS;  Service: Neurosurgery;  Laterality: N/A;   TRIGGER FINGER RELEASE Right 05/05/2021   Procedure: RELEASE TRIGGER FINGER/A-1 PULLEY;  Surgeon: Edie Norleen PARAS, MD;  Location: [facility] ORS;  Service: Orthopedics;  Laterality:  Right;    Home Medications: Prior to Admission medications   Medication Sig Start Date End Date Taking? Authorizing Provider  atorvastatin  (LIPITOR) 10 MG tablet Take 10 mg by mouth daily.     [provider]  celecoxib  (CELEBREX ) 200 MG capsule Take 1 capsule (200 mg total) by mouth daily. 03/13/23   Hilma Hastings, PA-C  ferrous sulfate 325 (65 FE) MG tablet Take 325 mg by mouth daily with breakfast.    [provider]  finasteride  (PROSCAR ) 5 MG tablet TAKE ONE TABLET EVERY DAY 01/12/24   McGowan, Clotilda A, PA-C  levothyroxine  (SYNTHROID ) 125 MCG tablet Take 125 mcg by mouth daily. 03/05/24   [provider]  loperamide  (IMODIUM ) 2 MG capsule Take 6 mg by mouth in the morning, at noon, and at bedtime.    [provider]  losartan  (COZAAR ) 50 MG tablet Take 50 mg by mouth daily. 03/05/24   [provider]  montelukast  (SINGULAIR ) 10 MG tablet Take 1 tablet by mouth daily. 06/16/23   [provider]  Multiple Vitamin (MULTI-VITAMIN) tablet Take 1 tablet by mouth daily.    [provider]  oxyCODONE  (OXY IR/ROXICODONE ) 5 MG immediate release tablet Take 1-2 tablets (5-10 mg total) by mouth every 6 (six) hours as needed for severe pain (pain score 7-10) or breakthrough pain. 06/14/24   [name], [name], PA-C  pantoprazole  (PROTONIX ) 40 MG tablet Take 1 tablet (40 mg total) by mouth 2 (two) times daily before a meal for 14 days. 03/24/24 06/06/24  [name], MD  potassium citrate  (UROCIT-K ) 10 MEQ (1080 MG) SR tablet Take 10 mEq by mouth 4 (four) times daily. 05/01/24   [provider]  sucralfate  (CARAFATE ) 1 g tablet Take 1 g by mouth in the morning, at noon, and at bedtime.    [provider]  tamsulosin  (FLOMAX ) 0.4 MG CAPS capsule TAKE 1 CAPSULE BY MOUTH ONCE DAILY 01/12/24   Helon Clotilda A, PA-C    Allergies: Allergies  Allergen Reactions   Morphine  And Codeine Other (See Comments)    Severe hallucinations    Tape Other (See Comments)    Silk Tape per patient peels off my skin   Cipro  [Ciprofloxacin  Hcl] Itching    Review of Systems: Review of Systems  Constitutional:  Negative for chills and fever.  Respiratory:  Negative for shortness of breath.   Cardiovascular:  Negative for chest pain.  Gastrointestinal:  Negative for abdominal pain (discomfort at drain site), diarrhea, nausea and vomiting.  Skin:  Positive for itching.    Physical Exam BP (!) 169/76   Pulse 80   Temp 97.6 F (36.4 C) (Oral)   Ht 5' 9 (1.753 m)   Wt 161 lb 3.2 oz (73.1 kg)   SpO2 96%   BMI 23.81 kg/m  CONSTITUTIONAL: No acute distress HEENT:  Normocephalic, atraumatic, extraocular motion intact. RESPIRATORY:  Normal respiratory effort without pathologic use of accessory muscles. CARDIOVASCULAR: Regular rhythm and rate GI: The abdomen is soft, nondistended, nontender to palpation.  The midline incision continues to heal well with  some residual scab in 2 areas.  Some of the scab was removed.  The drain site is well-healed.  There is mild discomfort inferiorly and medially from where the drain was going through the skin which is part of the track that the drain was making going through the abdominal wall.  No erythema anywhere. MUSCULOSKELETAL: Normal gait using a cane, no peripheral edema. NEUROLOGIC:  Motor and sensation is grossly normal.  Cranial nerves are grossly intact. PSYCH:  Alert and oriented to person, place and time. Affect is normal.   Assessment and Plan: This is a 83 y.o. male status post exploratory laparotomy, extensive lysis of adhesions, excision of infected mesh, debridement of midline skin wound/abscess cavity, small bowel resection with anastomosis, open cholecystectomy, bladder repair, recurrent ventral hernia repair with bilateral myocutaneous flaps with anterior release  -- The patient's CT scan on 08/10/2024 actually showed that the fluid collection does still remain was getting smaller.   Discussed with him that this means the fluid can reabsorb there is no worsening infection.  I believe the discomfort he is having is likely from scarring left by the drain track as it was going through the skin and abdominal wall.  This should ease up with time but there is no evidence of any issues there on the CT scan. - Patient already had repeat CT scan ordered for 09/12/2023 with follow-up appointment with me the week after.  Will keep this as they are so we can continue following any residual fluid collection and make sure everything continues to heal well. - Return precautions given.  I spent 20 minutes dedicated to the care of this patient on the date of this encounter to include pre-visit review of records, face-to-face time with the patient discussing diagnosis and management, and any post-visit coordination of care.   Stephen Sheree Plant, MD [facility]

## 2024-09-11 ENCOUNTER — Ambulatory Visit
Admission: RE | Admit: 2024-09-11 | Discharge: 2024-09-11 | Disposition: A | Discharge status: Home / Self Care | Source: Ambulatory Visit | Attending: Surgery | Admitting: Surgery

## 2024-09-11 DIAGNOSIS — L02211 Cutaneous abscess of abdominal wall: Secondary | ICD-10-CM | POA: Diagnosis present

## 2024-09-11 DIAGNOSIS — K651 Peritoneal abscess: Secondary | ICD-10-CM | POA: Diagnosis present

## 2024-09-11 MED ORDER — IOHEXOL 300 MG/ML  SOLN
100.0000 mL | Freq: Once | INTRAMUSCULAR | Status: AC | PRN
  Administered 2024-09-11: 100 mL via INTRAVENOUS

## 2024-09-12 ENCOUNTER — Other Ambulatory Visit: Payer: Self-pay

## 2024-09-12 ENCOUNTER — Emergency Department

## 2024-09-12 ENCOUNTER — Encounter: Payer: Self-pay | Admitting: Emergency Medicine

## 2024-09-12 ENCOUNTER — Emergency Department
Admission: EM | Admit: 2024-09-12 | Discharge: 2024-09-12 | Disposition: A | Discharge status: Home / Self Care | Attending: Emergency Medicine | Admitting: Emergency Medicine

## 2024-09-12 DIAGNOSIS — E875 Hyperkalemia: Secondary | ICD-10-CM | POA: Insufficient documentation

## 2024-09-12 DIAGNOSIS — R0789 Other chest pain: Secondary | ICD-10-CM | POA: Diagnosis present

## 2024-09-12 DIAGNOSIS — D72819 Decreased white blood cell count, unspecified: Secondary | ICD-10-CM | POA: Diagnosis not present

## 2024-09-12 LAB — BASIC METABOLIC PANEL WITH GFR
Anion gap: 9 (ref 5–15)
BUN: 13 mg/dL (ref 8–23)
CO2: 25 mmol/L (ref 22–32)
Calcium: 9.9 mg/dL (ref 8.9–10.3)
Chloride: 105 mmol/L (ref 98–111)
Creatinine, Ser: 1.11 mg/dL (ref 0.61–1.24)
GFR, Estimated: >60 mL/min
Glucose, Bld: 95 mg/dL (ref 70–99)
Potassium: 5.4 mmol/L — ABNORMAL HIGH (ref 3.5–5.1)
Sodium: 139 mmol/L (ref 135–145)

## 2024-09-12 LAB — CBC
HCT: 40.8 % (ref 39.0–52.0)
Hemoglobin: 12.5 g/dL — ABNORMAL LOW (ref 13.0–17.0)
MCH: 25.8 pg — ABNORMAL LOW (ref 26.0–34.0)
MCHC: 30.6 g/dL (ref 30.0–36.0)
MCV: 84.3 fL (ref 80.0–100.0)
Platelets: 172 K/uL (ref 150–400)
RBC: 4.84 MIL/uL (ref 4.22–5.81)
RDW: 17.6 % — ABNORMAL HIGH (ref 11.5–15.5)
WBC: 3.9 K/uL — ABNORMAL LOW (ref 4.0–10.5)
nRBC: 0 % (ref 0.0–0.2)

## 2024-09-12 LAB — HEPATIC FUNCTION PANEL
ALT: 15 U/L (ref 0–44)
AST: 29 U/L (ref 15–41)
Albumin: 4 g/dL (ref 3.5–5.0)
Alkaline Phosphatase: 106 U/L (ref 38–126)
Bilirubin, Direct: 0.3 mg/dL — ABNORMAL HIGH (ref 0.0–0.2)
Indirect Bilirubin: 0.4 mg/dL (ref 0.3–0.9)
Total Bilirubin: 0.6 mg/dL (ref 0.0–1.2)
Total Protein: 7.4 g/dL (ref 6.5–8.1)

## 2024-09-12 LAB — TROPONIN T, HIGH SENSITIVITY: Troponin T High Sensitivity: 17 ng/L (ref 0–19)

## 2024-09-12 LAB — LIPASE, BLOOD: Lipase: 21 U/L (ref 11–51)

## 2024-09-12 MED ORDER — SODIUM ZIRCONIUM CYCLOSILICATE 10 G PO PACK
10.0000 g | PACK | Freq: Once | ORAL | Status: AC
  Administered 2024-09-12: 10 g via ORAL
  Filled 2024-09-12: qty 1

## 2024-09-12 MED ORDER — KETOROLAC TROMETHAMINE 30 MG/ML IJ SOLN
30.0000 mg | Freq: Once | INTRAMUSCULAR | Status: AC
  Administered 2024-09-12: 30 mg via INTRAMUSCULAR
  Filled 2024-09-12: qty 1

## 2024-09-12 MED ORDER — ACETAMINOPHEN 500 MG PO TABS
1000.0000 mg | ORAL_TABLET | Freq: Once | ORAL | Status: AC
  Administered 2024-09-12: 1000 mg via ORAL
  Filled 2024-09-12: qty 2

## 2024-09-12 NOTE — ED Provider Notes (Signed)
 "  [facility] Provider Note    Event Date/Time   First MD Initiated Contact with Patient 09/12/24 1218     (approximate)   History   Chest Pain   HPI  Stephen Cervantes is a 84 y.o. male with history of abdominal wall abscess who comes in with concerns for chest pain.  Patient reports point tenderness on the bottom of his sternum, top of his surgical incision area.  He reports has been there for the past couple of days.  He did undergo a CT scan yesterday of his abdomen pelvis that showed improvement of the prior fluid collection.  He denies any fevers and denies taking anything to try to help with this pain. According to wife, she does report that he did some heavy lifting.  IMPRESSION: 1. Postoperative changes including throughout the ventral abdominal wall with further regressed fluid collection, now only trace/subtle residual. 2. No acute or inflammatory process identified in the abdomen or pelvis. 3. 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   Physical Exam   Triage Vital Signs: ED Triage Vitals  Encounter Vitals Group     BP 09/12/24 1031 (!) 163/95     Girls Systolic BP Percentile --      Girls Diastolic BP Percentile --      Boys Systolic BP Percentile --      Boys Diastolic BP Percentile --      Pulse Rate 09/12/24 1031 71     Resp 09/12/24 1031 16     Temp 09/12/24 1031 97.7 F (36.5 C)     Temp Source 09/12/24 1031 Oral     SpO2 09/12/24 1031 98 %     Weight 09/12/24 1033 170 lb (77.1 kg)     Height 09/12/24 1033 5' 9 (1.753 m)     Head Circumference --      Peak Flow --      Pain Score 09/12/24 1033 6     Pain Loc --      Pain Education --      Exclude from Growth Chart --     Most recent vital signs: Vitals:   09/12/24 1031  BP: (!) 163/95  Pulse: 71  Resp: 16  Temp: 97.7 F (36.5 C)  SpO2: 98%     General: Awake, no distress.  CV:  Good peripheral perfusion.   Resp:  Normal effort.  Abd:  No distention.  Other:  Point tenderness over the lower sternum right above his surgical incision.  No warmth, no redness noted   ED Results / Procedures / Treatments   Labs (all labs ordered are listed, but only abnormal results are displayed) Labs Reviewed  BASIC METABOLIC PANEL WITH GFR - Abnormal; Notable for the following components:      Result Value   Potassium 5.4 (*)    All other components within normal limits  CBC - Abnormal; Notable for the following components:   WBC 3.9 (*)    Hemoglobin 12.5 (*)    MCH 25.8 (*)    RDW 17.6 (*)    All other components within normal limits  POTASSIUM  HEPATIC FUNCTION PANEL  LIPASE, BLOOD  TROPONIN T, HIGH SENSITIVITY  TROPONIN T, HIGH SENSITIVITY     EKG  My interpretation of EKG:  Normal sinus rate of 66 without any ST elevation or T wave inversions, first-degree AV block  RADIOLOGY I have reviewed the xray personally and interpreted no evidence of  any pneumonia   PROCEDURES:  Critical Care performed: No  Procedures   MEDICATIONS ORDERED IN ED: Medications  sodium zirconium cyclosilicate  (LOKELMA ) packet 10 g (has no administration in time range)  ketorolac  (TORADOL ) 30 MG/ML injection 30 mg (30 mg Intramuscular Given 09/12/24 1340)  acetaminophen  (TYLENOL ) tablet 1,000 mg (1,000 mg Oral Given 09/12/24 1340)     IMPRESSION / MDM / ASSESSMENT AND PLAN / ED COURSE  I reviewed the triage vital signs and the nursing notes.   Patient's presentation is most consistent with acute presentation with potential threat to life or bodily function.   Patient comes in with point tenderness over the lower sternum just above his surgical incision site.  This been ongoing for couple of days and he just had CT imaging done yesterday.  I will discuss with the radiologist to see if they can see the bottom part of the sternum or if they have any other concerns.  His BMP shows slightly elevated potassium  of 5.4 although he is got normal creatinine so I will recheck this.  His CBC showed a white count of 3.9 but he denies any fevers or infectious symptoms.  Initial troponin was negative.  Did discuss with the radiologist they did not see any obvious abscess over the sternum.  Overall the CT imaging was improved.  Did also let his surgical team Dr. Desiderio know that he was here and he states that he has an appointment tomorrow that he can follow-up with them.  Patient was given some Tylenol , Toradol  to help with symptoms.  If repeat troponin and potassium are reassuring patient can be discharged home with follow-up with surgical team tomorrow  Patient has been stuck multiple times.  Discussed with patient that the typical protocol is to get a repeat troponin given his first 1 although normal was still on the side where we would recommend a repeat.  Given he has point tenderness this does not seem consistent with ACS and patient does not want to have a second troponin done which I think is reasonable.  We also did discuss his slightly elevated potassium which I suspect could just be a lab error given he has no renal dysfunction and he denies any potassium supplementation.  However given he has been stuck multiple times without able to get blood work he would prefer to follow this up outpatient.  Going to give him a dose of Lokelma  here antibody discussed with the surgical team who is going to see him tomorrow they will order the repeat potassium in a few days.  He is going to try to hydrate to help make himself a better stick and he will continue to have this closely monitor.  He understands the potassium levels go too high that can be life-threatening but at this time is only slightly elevated his EKG is reassuring and he has got reproducible chest wall tenderness on examination.  He also now will have close follow-up with the surgical team tomorrow.   FINAL CLINICAL IMPRESSION(S) / ED DIAGNOSES   Final  diagnoses:  Hyperkalemia  Atypical chest pain     Rx / DC Orders   ED Discharge Orders     None        Note:  This document was prepared using Dragon voice recognition software and may include unintentional dictation errors.   Ernest Ronal BRAVO, MD 09/12/24 1414  "

## 2024-09-12 NOTE — Discharge Instructions (Addendum)
 The surgical office should call you to confirm appointment but the surgeon told me that it should be around 9 AM.  They are going to recheck your potassium level so make sure you stay well-hydrated.  Return to the ER if you develop fevers, shortness of breath or any other concerns.  You will need to have your potassium levels monitored.  They are only slightly elevated today and this could just be a lab error we just want to ensure that they are not going back up as this can be dangerous.

## 2024-09-12 NOTE — ED Triage Notes (Signed)
 Pt to ED via POV. Pt states that he has been having chest pain for the past few days. Pt states that he thought that it was a pulled muscle. Pt states that the pain is much worse today than it was yesterday. Pt states that he is not having any other symptoms at this time.

## 2024-09-13 ENCOUNTER — Ambulatory Visit: Payer: Self-pay | Admitting: Surgery

## 2024-09-13 ENCOUNTER — Other Ambulatory Visit
Admission: RE | Admit: 2024-09-13 | Discharge: 2024-09-13 | Disposition: A | Source: Ambulatory Visit | Attending: Surgery | Admitting: Surgery

## 2024-09-13 ENCOUNTER — Ambulatory Visit: Admitting: Surgery

## 2024-09-13 ENCOUNTER — Encounter: Payer: Self-pay | Admitting: Surgery

## 2024-09-13 VITALS — BP 161/86 | HR 76 | Temp 97.8°F | Wt 166.4 lb

## 2024-09-13 DIAGNOSIS — E875 Hyperkalemia: Secondary | ICD-10-CM | POA: Diagnosis not present

## 2024-09-13 DIAGNOSIS — L02211 Cutaneous abscess of abdominal wall: Secondary | ICD-10-CM | POA: Diagnosis not present

## 2024-09-13 DIAGNOSIS — Z9889 Other specified postprocedural states: Secondary | ICD-10-CM

## 2024-09-13 DIAGNOSIS — K8020 Calculus of gallbladder without cholecystitis without obstruction: Secondary | ICD-10-CM

## 2024-09-13 DIAGNOSIS — Z8719 Personal history of other diseases of the digestive system: Secondary | ICD-10-CM

## 2024-09-13 LAB — POTASSIUM: Potassium: 4.3 mmol/L (ref 3.5–5.1)

## 2024-09-13 NOTE — Patient Instructions (Signed)
 Continue with Ibuprofen and Tylenol  and use ice packs.  Costochondritis  Costochondritis is irritation and swelling (inflammation) of the tissue that connects the ribs to the breastbone (sternum). This tissue is called cartilage. This condition causes pain in the front of the chest. The pain often starts slowly. It may be in more than one rib. What are the causes? The cause of this condition is not always known. It can come from stress on the sternum. The cause of this stress could be: Chest injury. Exercise or activity. This may include lifting. Very bad coughing. What increases the risk? Being male. Being 84-4 years old. Starting a new exercise or work activity. Having low levels of vitamin D. Having a condition that makes you cough a lot. What are the signs or symptoms? Chest pain that: Starts slowly. It can be sharp or dull. Gets worse with deep breathing, coughing, or exercise. Gets better with rest. May be worse when you press on your ribs and breastbone. How is this treated? In most cases, this condition goes away on its own over time. You may need to take an NSAID, such as ibuprofen. This can help reduce pain. You may also need to: Rest and stay away from activities that make pain worse. Put heat or ice on the area that hurts. Do exercises to stretch your chest muscles. If these treatments do not help, your doctor may inject a medicine to numb the area. This can help relieve the pain. Follow these instructions at home: Managing pain, stiffness, and swelling     If told, put ice on the painful area. To do this: Put ice in a plastic bag. Place a towel between your skin and the bag. Leave the ice on for 20 minutes, 2-3 times a day. If told, put heat on the affected area. Do this as often as told by your doctor. Use the heat source that your doctor recommends, such as a moist heat pack or a heating pad. Place a towel between your skin and the heat source. Leave the heat  on for 20-30 minutes. If your skin turns bright red, take off the ice or heat right away to prevent skin damage. The risk of skin damage is higher if you cannot feel pain, heat, or cold. Activity Rest as told by your doctor. Do not do things that make your pain worse. This includes activities that use your chest, belly (abdomen), and side muscles. You may have to avoid lifting. Ask your doctor how much you can safely lift. Return to your normal activities when your doctor says that it is safe. General instructions Take over-the-counter and prescription medicines only as told by your doctor. Contact a doctor if: You have chills or a fever. Your pain does not go away or gets worse. You have a cough that does not go away. Get help right away if: You have a hard time breathing. You have very bad chest pain that does not get better with medicines, heat, or ice. These symptoms may be an emergency. Get help right away. Call 911. Do not wait to see if the symptoms will go away. Do not drive yourself to the hospital. This information is not intended to replace advice given to you by your health care provider. Make sure you discuss any questions you have with your health care provider. Document Revised: 03/10/2022 Document Reviewed: 03/10/2022 Elsevier Patient Education  2024 [employer].

## 2024-09-13 NOTE — Progress Notes (Signed)
 " 09/13/2024  History of Present Illness: Stephen Cervantes is a 84 y.o. male status post exploratory laparotomy, extensive lysis of adhesions, excision of infected mesh, debridement of midline skin wound/abscess cavity, small bowel resection with anastomosis, open cholecystectomy, bladder repair, recurrent ventral hernia repair with bilateral myocutaneous flaps with anterior release on 06/07/2024.   Patient was admitted on 07/12/24 with nausea, worsening abdominal pain and distention, and had a CT scan showing a very large fluid collection in the abdominal wall.  He had a percutaneous drain placed by IR on 07/13/24 which drained 2 L of purulent fluid.  Cultures grew Citrobacter and Klebsiella, and was discharged home on 07/16/24 on Cipro  and Flagyl .    He had repeat CT scan on 07/30/2024 which showed improvement in his fluid collection.  His drain was removed by interventional radiology that day.  Patient most recently presented to the emergency room yesterday due to pain at the bottom portion of the sternum.  We had done a repeat CT scan as an outpatient on 09/11/2024 which did not show any issues in that area of the xiphoid process and also showed that the amount of fluid around the prior mesh continues to decrease.  There were no other complicating issues.  The only issue in the emergency room noted was that his potassium was elevated to 5.4.  He received a dose of Lokelma  to help with this.  The patient reports that today he is feeling better and that the pain has been improving.  He has been alternating between Tylenol  and ibuprofen.  Past Medical History: Past Medical History:  Diagnosis Date   Arthritis    Asthma    Blood transfusion without reported diagnosis    Bowel obstruction (HCC) 7988,7984   Depression    Diverticulitis 2006   Enlarged prostate    Gallstones 07/04/2014   History of gastrointestinal ulcer    History of kidney stones    HLD (hyperlipidemia)    HTN (hypertension)     Kidney stone    kidney stones   Neuritis or radiculitis due to rupture of lumbar intervertebral disc 05/29/2015   Thyroid disease      Past Surgical History: Past Surgical History:  Procedure Laterality Date   ABDOMINAL WALL DEFECT REPAIR N/A 06/07/2024   Procedure: REPAIR, ABDOMINAL WALL;  Surgeon: Desiderio Schanz, MD;  Location: [facility] ORS;  Service: General;  Laterality: N/A;   APPENDECTOMY     BOWEL RESECTION  06/07/2024   Procedure: EXCISION, SMALL INTESTINE;  Surgeon: Desiderio Schanz, MD;  Location: [facility] OR[facility];  Service: General;;   CHOLECYSTECTOMY N/A 06/07/2024   Procedure: CHOLECYSTECTOMY;  Surgeon: Desiderio Schanz, MD;  Location: [facility] OR[facility];  Service: General;  Laterality: N/A;   COLECTOMY  09/05/2004   Abdominal colectomy, ileostomy, Hartman procedure for perforation of cecum, sigmoid diverticulitis   ESOPHAGOGASTRODUODENOSCOPY N/A 05/30/2024   Procedure: EGD (ESOPHAGOGASTRODUODENOSCOPY);  Surgeon: [name], MD;  Location: [facility] ENDOSCOPY;  Service: Gastroenterology;  Laterality: N/A;   EXCISION OF MESH N/A 06/07/2024   Procedure: REMOVAL, MESH, ABDOMEN OR PELVIS;  Surgeon: Desiderio Schanz, MD;  Location: [facility] ORS;  Service: General;  Laterality: N/A;   HERNIA REPAIR  2008?   [facility]   ILEOSTOMY  09/05/2005   IR RADIOLOGIST EVAL & MGMT  07/30/2024   JOINT REPLACEMENT  09/05/2012   Hip Replacement [name]   KNEE SURGERY Right 09/05/2009   POSTERIOR LUMBAR FUSION 4 LEVEL N/A 03/13/2019   Procedure: POSTERIOR LUMBAR FUSION 4 LEVEL  L1-5;  Surgeon: Clois Fret, MD;  Location: [facility] ORS;  Service: Neurosurgery;  Laterality: N/A;   TRIGGER FINGER RELEASE Right 05/05/2021   Procedure: RELEASE TRIGGER FINGER/A-1 PULLEY;  Surgeon: Edie Norleen PARAS, MD;  Location: [facility] ORS;  Service: Orthopedics;  Laterality: Right;    Home Medications: Prior to Admission medications   Medication Sig Start Date End Date Taking? Authorizing Provider  atorvastatin   (LIPITOR) 10 MG tablet Take 10 mg by mouth daily.     [provider]  celecoxib  (CELEBREX ) 200 MG capsule Take 1 capsule (200 mg total) by mouth daily. 03/13/23   Hilma Hastings, PA-C  ferrous sulfate 325 (65 FE) MG tablet Take 325 mg by mouth daily with breakfast.    [provider]  finasteride  (PROSCAR ) 5 MG tablet TAKE ONE TABLET EVERY DAY 01/12/24   McGowan, Clotilda A, PA-C  levothyroxine  (SYNTHROID ) 125 MCG tablet Take 125 mcg by mouth daily. 03/05/24   [provider]  loperamide  (IMODIUM ) 2 MG capsule Take 6 mg by mouth in the morning, at noon, and at bedtime.    [provider]  losartan  (COZAAR ) 50 MG tablet Take 50 mg by mouth daily. 03/05/24   [provider]  montelukast  (SINGULAIR ) 10 MG tablet Take 1 tablet by mouth daily. 06/16/23   [provider]  Multiple Vitamin (MULTI-VITAMIN) tablet Take 1 tablet by mouth daily.    [provider]  oxyCODONE  (OXY IR/ROXICODONE ) 5 MG immediate release tablet Take 1-2 tablets (5-10 mg total) by mouth every 6 (six) hours as needed for severe pain (pain score 7-10) or breakthrough pain. 06/14/24   [name], [name], PA-C  pantoprazole  (PROTONIX ) 40 MG tablet Take 1 tablet (40 mg total) by mouth 2 (two) times daily before a meal for 14 days. 03/24/24 06/06/24  [name], MD  potassium citrate  (UROCIT-K ) 10 MEQ (1080 MG) SR tablet Take 10 mEq by mouth 4 (four) times daily. 05/01/24   [provider]  sucralfate  (CARAFATE ) 1 g tablet Take 1 g by mouth in the morning, at noon, and at bedtime.    [provider]  tamsulosin  (FLOMAX ) 0.4 MG CAPS capsule TAKE 1 CAPSULE BY MOUTH ONCE DAILY 01/12/24   Helon Clotilda A, PA-C    Allergies: Allergies  Allergen Reactions   Morphine  And Codeine Other (See Comments)    Severe hallucinations   Tape Other (See Comments)    Silk Tape per patient peels off my skin   Cipro  [Ciprofloxacin  Hcl] Itching    Review of Systems: Review of  Systems  Constitutional:  Negative for chills and fever.  Respiratory:  Negative for shortness of breath.   Cardiovascular:  Negative for chest pain.  Gastrointestinal:  Negative for abdominal pain (discomfort at drain site), diarrhea, nausea and vomiting.  Musculoskeletal:        Pain at the xiphoid process    Physical Exam BP (!) 161/86   Pulse 76   Temp 97.8 F (36.6 C) (Oral)   Wt 166 lb 6.4 oz (75.5 kg)   SpO2 95%   BMI 24.57 kg/m  CONSTITUTIONAL: No acute distress HEENT:  Normocephalic, atraumatic, extraocular motion intact. RESPIRATORY:  Normal respiratory effort without pathologic use of accessory muscles. CARDIOVASCULAR: Regular rhythm and rate GI: The abdomen is soft, nondistended, nontender to palpation.  Midline incision is well-healed without any evidence of hernia recurrence.   MUSCULOSKELETAL: At the point of the xiphoid process, the patient has focal tenderness without any evidence of infection or masses. NEUROLOGIC:  Motor and sensation is grossly normal.  Cranial nerves are grossly intact. PSYCH:  Alert and oriented to person, place and time. Affect is normal.   Assessment and Plan: This is a 84 y.o. male status post exploratory laparotomy, extensive lysis of adhesions, excision of infected mesh, debridement of midline skin wound/abscess cavity, small bowel resection with anastomosis, open cholecystectomy, bladder repair, recurrent ventral hernia repair with bilateral myocutaneous flaps with anterior release  -- The patient CT scan from 09/11/2024 does not show any complications at the area of the patient's pain in the xiphoid process area.  Also shows that the amount of fluid in the abdominal wall has decreased compared to the prior CT last month.  I think overall the patient continues to improve. - From the current episode of pain at the xiphoid process, discussed with patient that this could potentially be an episode of costochondritis.  The main management of this  would be taking NSAIDs which she is already doing by taking ibuprofen.  I think Tylenol  is perfectly fine to continue as well and also recommend that he can use ice packs to help with the inflammation and pain. - The patient's potassium was repeated as an outpatient today and is back to normal down to 4.3.  The patient reports that he takes potassium supplement at home.  I instructed the patient to stop the supplementation over the weekend and then afterwards to only take half dose.  He should also follow-up with his PCP to make sure his potassium level remains stable. - Otherwise no further issues from the surgical standpoint.  Patient will follow-up with us  as needed.  I spent 20 minutes dedicated to the care of this patient on the date of this encounter to include pre-visit review of records, face-to-face time with the patient discussing diagnosis and management, and any post-visit coordination of care.   Aloysius Sheree Plant, MD [facility]      "

## 2024-09-18 ENCOUNTER — Encounter: Admitting: Surgery

## 2024-09-18 ENCOUNTER — Telehealth: Payer: Self-pay | Admitting: Urology

## 2024-09-18 NOTE — Telephone Encounter (Signed)
 Spoke with Stephen Cervantes regarding his MRI results from August 2025 and recent CT results and the recommendation of repeating the renal MRI in August 2026
